# Patient Record
Sex: Male | Born: 1970 | Race: White | Hispanic: No | Marital: Married | Smoking: Never smoker
Health system: Southern US, Community
[De-identification: ages and names within clinical notes are randomized; demographics above are authoritative.]

## PROBLEM LIST (undated history)

## (undated) DIAGNOSIS — J209 Acute bronchitis, unspecified: Secondary | ICD-10-CM

## (undated) DIAGNOSIS — M109 Gout, unspecified: Secondary | ICD-10-CM

## (undated) DIAGNOSIS — J1282 Pneumonia due to coronavirus disease 2019: Secondary | ICD-10-CM

## (undated) DIAGNOSIS — I10 Essential (primary) hypertension: Secondary | ICD-10-CM

## (undated) DIAGNOSIS — J9621 Acute and chronic respiratory failure with hypoxia: Secondary | ICD-10-CM

## (undated) DIAGNOSIS — F419 Anxiety disorder, unspecified: Secondary | ICD-10-CM

## (undated) DIAGNOSIS — U071 COVID-19: Secondary | ICD-10-CM

## (undated) DIAGNOSIS — Z930 Tracheostomy status: Secondary | ICD-10-CM

## (undated) DIAGNOSIS — I2510 Atherosclerotic heart disease of native coronary artery without angina pectoris: Secondary | ICD-10-CM

## (undated) DIAGNOSIS — E785 Hyperlipidemia, unspecified: Secondary | ICD-10-CM

## (undated) NOTE — *Deleted (*Deleted)
Physician Discharge Summary       Patient ID: Lawrence Freeman MRN: [id number] DOB/AGE: Nov 23, 1971 69 y.o.  Admit date: 10/25/2020 Discharge date: 10/27/2020  Discharge Diagnoses:  Active Problems:   Pneumothorax on right   Pressure injury of skin   Chest tube in place   Malnutrition of moderate degree   History of Present Illness:   Hospital Course:     Discharge Plan by active problems      Significant Hospital tests/ studies  Consults   Discharge Exam: BP 123/77   Pulse 71   Temp 97.7 F (36.5 C) (Oral)   Resp (!) 23   Ht 5\' 10"  (1.778 m)   Wt 74.3 kg   SpO2 98%   BMI 23.50 kg/m   ******  Labs at discharge Lab Results  Component Value Date   CREATININE 0.72 10/27/2020   BUN 23 (H) 10/27/2020   NA 140 10/27/2020   K 4.0 10/27/2020   CL 95 (L) 10/27/2020   CO2 32 10/27/2020   Lab Results  Component Value Date   WBC 14.7 (H) 10/27/2020   HGB 7.7 (L) 10/27/2020   HCT 24.6 (L) 10/27/2020   MCV 99.6 10/27/2020   PLT 195 10/27/2020   Lab Results  Component Value Date   ALT 32 10/23/2020   AST 34 10/23/2020   ALKPHOS 65 10/23/2020   BILITOT 0.8 10/23/2020   Lab Results  Component Value Date   INR 1.2 10/19/2020   INR 1.1 10/17/2020    Current radiology studies DG Chest 1 View  Result Date: 10/26/2020 CLINICAL DATA:  Hypoxemia EXAM: CHEST  1 VIEW COMPARISON:  10/25/2020 FINDINGS: Tracheostomy tube and right chest tube remain in place, unchanged. Extensive subcutaneous emphysema throughout the chest wall, unchanged. Pneumomediastinum again noted. No visible pneumothorax. Heart is normal size. Patchy multifocal areas of consolidation slightly improved. No effusions. IMPRESSION: No visible pneumothorax. Pneumomediastinum and subcutaneous emphysema are stable. Patchy bilateral airspace opacities, slightly improved. Electronically Signed   By: Charlett Nose M.D.   On: 10/26/2020 01:26   DG CHEST PORT 1 VIEW  Result Date: 10/27/2020  CLINICAL DATA:  Right pneumothorax, COVID-19 EXAM: PORTABLE CHEST 1 VIEW COMPARISON:  Radiograph 10/26/2020, CT 10/23/2020 FINDINGS: Tracheostomy tube with the tip positioned in the lower trachea, 1.6 cm from the carina. Right upper extremity PICC tip terminates near the superior cavoatrial junction. Telemetry leads and additional external support devices overlie the chest. Right apically directed chest tube remains in place, appears slightly advanced from comparison though may be related to differences in positioning. No visible residual right pneumothorax is seen. No left pneumothorax. Pneumomediastinum is less readily apparent. Persistent heterogeneous airspace opacities are seen bilaterally. Diminishing subcutaneous emphysema across the base of the neck and chest wall. No acute osseous abnormalities. IMPRESSION: 1. Right chest tube appears slightly advanced from comparison though may be related to differences in positioning. No residual pneumothorax is visible within the limitations of a non upright radiograph. 2. Persistent bilateral heterogeneous airspace opacities. 3. Diminishing subcutaneous emphysema across the base of the neck and chest wall. 4. Previously seen pneumomediastinum is less readily apparent. Electronically Signed   By: Kreg Shropshire M.D.   On: 10/27/2020 02:31   DG CHEST PORT 1 VIEW  Result Date: 10/25/2020 CLINICAL DATA:  Right pneumothorax status post chest tube placement EXAM: PORTABLE CHEST 1 VIEW COMPARISON:  10/25/2020 at 6:13 p.m. FINDINGS: Single frontal view of the chest demonstrates interval placement of a right chest tube, with near complete resolution of  the right pneumothorax seen previously. Tracheostomy tube is unchanged. Right-sided PICC is stable. Cardiac silhouette is unremarkable. Extensive pneumomediastinum and subcutaneous emphysema limits evaluation of the underlying lung parenchyma. Patchy multifocal bilateral consolidation unchanged. No pleural effusion. IMPRESSION:  1. Near complete resolution of right pneumothorax after chest tube placement. 2. Persistent pneumomediastinum and subcutaneous gas. 3. Stable multifocal bilateral lung consolidation consistent with scarring or pneumonia. Electronically Signed   By: Sharlet Salina M.D.   On: 10/25/2020 22:47   DG CHEST PORT 1 VIEW  Result Date: 10/25/2020 CLINICAL DATA:  Rapid response intubation. EXAM: PORTABLE CHEST 1 VIEW COMPARISON:  Radiograph and chest CT 10/23/2020 FINDINGS: There is a new tracheostomy tube tip with tip projecting approximately 4.3 cm from the carina. Moderate-sized right pneumothorax of approximately 40%, new from prior exam. Large amount of subcutaneous emphysema involving the chest walls, also new. No obvious left pneumothorax. Pneumomediastinum on prior chest CT is not well seen on the current exam. Right upper extremity PICC remains in place. There heterogeneous bilateral lung opacities not significantly changed. Stable heart size and mediastinal contours. IMPRESSION: 1. New tracheostomy tube with tip projecting approximately 4.3 cm from the carina. 2. Moderate right pneumothorax of approximately 40%, new from prior exam. Large amount of subcutaneous emphysema involving both chest walls, also new. 3. Unchanged bilateral heterogeneous lung opacities. Critical Value/emergent results were called by telephone at the time of interpretation on 10/25/2020 at 6:28 pm to Dr Luna Kitchens , who verbally acknowledged these results. Electronically Signed   By: Narda Rutherford M.D.   On: 10/25/2020 18:28    Disposition:     Allergies as of 10/27/2020      Reactions   Penicillins Anaphylaxis   Precedex [dexmedetomidine Hcl In Nacl] Other (See Comments)   Severe bradycardia    Med Rec must be completed prior to using this [facility]***        Discharged Condition: stable

---
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
## 2020-09-20 DIAGNOSIS — Z930 Tracheostomy status: Secondary | ICD-10-CM

## 2020-09-20 HISTORY — DX: Tracheostomy status: Z93.0

## 2020-10-11 ENCOUNTER — Other Ambulatory Visit (HOSPITAL_COMMUNITY): Payer: BC Managed Care – PPO

## 2020-10-11 ENCOUNTER — Inpatient Hospital Stay
Admit: 2020-10-11 | Discharge: 2020-10-25 | Disposition: A | Discharge status: Still In Hospital | Payer: BC Managed Care – PPO | Source: Other Acute Inpatient Hospital | Attending: Internal Medicine | Admitting: Internal Medicine

## 2020-10-11 DIAGNOSIS — J9621 Acute and chronic respiratory failure with hypoxia: Secondary | ICD-10-CM | POA: Diagnosis present

## 2020-10-11 DIAGNOSIS — U071 COVID-19: Secondary | ICD-10-CM | POA: Diagnosis present

## 2020-10-11 DIAGNOSIS — Z9289 Personal history of other medical treatment: Secondary | ICD-10-CM

## 2020-10-11 DIAGNOSIS — J9690 Respiratory failure, unspecified, unspecified whether with hypoxia or hypercapnia: Secondary | ICD-10-CM

## 2020-10-11 DIAGNOSIS — J209 Acute bronchitis, unspecified: Secondary | ICD-10-CM | POA: Diagnosis present

## 2020-10-11 DIAGNOSIS — R1110 Vomiting, unspecified: Secondary | ICD-10-CM

## 2020-10-11 DIAGNOSIS — Z930 Tracheostomy status: Secondary | ICD-10-CM

## 2020-10-11 DIAGNOSIS — R000 Tachycardia, unspecified: Secondary | ICD-10-CM

## 2020-10-11 DIAGNOSIS — R4702 Dysphasia: Secondary | ICD-10-CM

## 2020-10-11 DIAGNOSIS — J189 Pneumonia, unspecified organism: Secondary | ICD-10-CM

## 2020-10-11 DIAGNOSIS — R1310 Dysphagia, unspecified: Secondary | ICD-10-CM

## 2020-10-11 DIAGNOSIS — R0603 Acute respiratory distress: Secondary | ICD-10-CM

## 2020-10-11 DIAGNOSIS — J1282 Pneumonia due to coronavirus disease 2019: Secondary | ICD-10-CM | POA: Diagnosis present

## 2020-10-11 HISTORY — DX: Acute and chronic respiratory failure with hypoxia: J96.21

## 2020-10-11 HISTORY — DX: COVID-19: U07.1

## 2020-10-11 HISTORY — DX: Pneumonia due to coronavirus disease 2019: J12.82

## 2020-10-11 HISTORY — DX: Acute bronchitis, unspecified: J20.9

## 2020-10-11 LAB — BLOOD GAS, ARTERIAL
Acid-Base Excess: 6.6 mmol/L — ABNORMAL HIGH (ref 0.0–2.0)
Bicarbonate: 32.2 mmol/L — ABNORMAL HIGH (ref 20.0–28.0)
FIO2: 50
O2 Saturation: 97.2 %
Patient temperature: 37
pCO2 arterial: 62.3 mmHg — ABNORMAL HIGH (ref 32.0–48.0)
pH, Arterial: 7.334 — ABNORMAL LOW (ref 7.350–7.450)
pO2, Arterial: 98.3 mmHg (ref 83.0–108.0)

## 2020-10-12 LAB — COMPREHENSIVE METABOLIC PANEL
ALT: 82 U/L — ABNORMAL HIGH (ref 0–44)
AST: 36 U/L (ref 15–41)
Albumin: 2.6 g/dL — ABNORMAL LOW (ref 3.5–5.0)
Alkaline Phosphatase: 87 U/L (ref 38–126)
Anion gap: 13 (ref 5–15)
BUN: 34 mg/dL — ABNORMAL HIGH (ref 6–20)
CO2: 28 mmol/L (ref 22–32)
Calcium: 9.1 mg/dL (ref 8.9–10.3)
Chloride: 101 mmol/L (ref 98–111)
Creatinine, Ser: 0.97 mg/dL (ref 0.61–1.24)
GFR, Estimated: >60 mL/min (ref >60)
Glucose, Bld: 204 mg/dL — ABNORMAL HIGH (ref 70–99)
Potassium: 4.3 mmol/L (ref 3.5–5.1)
Sodium: 142 mmol/L (ref 135–145)
Total Bilirubin: 0.4 mg/dL (ref 0.3–1.2)
Total Protein: 6.4 g/dL — ABNORMAL LOW (ref 6.5–8.1)

## 2020-10-12 LAB — CBC WITH DIFFERENTIAL/PLATELET
Abs Immature Granulocytes: 0 10*3/uL (ref 0.00–0.07)
Basophils Absolute: 0.3 10*3/uL — ABNORMAL HIGH (ref 0.0–0.1)
Basophils Relative: 2 %
Eosinophils Absolute: 1.3 10*3/uL — ABNORMAL HIGH (ref 0.0–0.5)
Eosinophils Relative: 8 %
HCT: 29.4 % — ABNORMAL LOW (ref 39.0–52.0)
Hemoglobin: 9.4 g/dL — ABNORMAL LOW (ref 13.0–17.0)
Lymphocytes Relative: 15 %
Lymphs Abs: 2.4 10*3/uL (ref 0.7–4.0)
MCH: 29.6 pg (ref 26.0–34.0)
MCHC: 32 g/dL (ref 30.0–36.0)
MCV: 92.5 fL (ref 80.0–100.0)
Monocytes Absolute: 0.2 10*3/uL (ref 0.1–1.0)
Monocytes Relative: 1 %
Neutro Abs: 11.8 10*3/uL — ABNORMAL HIGH (ref 1.7–7.7)
Neutrophils Relative %: 74 %
Platelets: 345 10*3/uL (ref 150–400)
RBC: 3.18 MIL/uL — ABNORMAL LOW (ref 4.22–5.81)
RDW: 15.4 % (ref 11.5–15.5)
WBC: 16 10*3/uL — ABNORMAL HIGH (ref 4.0–10.5)
nRBC: 0.4 % — ABNORMAL HIGH (ref 0.0–0.2)
nRBC: 1 /100 WBC — ABNORMAL HIGH

## 2020-10-13 ENCOUNTER — Other Ambulatory Visit (HOSPITAL_COMMUNITY): Payer: BC Managed Care – PPO

## 2020-10-13 ENCOUNTER — Encounter: Payer: Self-pay | Admitting: Internal Medicine

## 2020-10-13 DIAGNOSIS — Z930 Tracheostomy status: Secondary | ICD-10-CM | POA: Diagnosis not present

## 2020-10-13 DIAGNOSIS — J1282 Pneumonia due to coronavirus disease 2019: Secondary | ICD-10-CM | POA: Diagnosis present

## 2020-10-13 DIAGNOSIS — U071 COVID-19: Secondary | ICD-10-CM | POA: Diagnosis present

## 2020-10-13 DIAGNOSIS — J9621 Acute and chronic respiratory failure with hypoxia: Secondary | ICD-10-CM | POA: Diagnosis present

## 2020-10-13 DIAGNOSIS — J209 Acute bronchitis, unspecified: Secondary | ICD-10-CM | POA: Diagnosis not present

## 2020-10-13 LAB — CBC
HCT: 26.7 % — ABNORMAL LOW (ref 39.0–52.0)
Hemoglobin: 8.4 g/dL — ABNORMAL LOW (ref 13.0–17.0)
MCH: 29.7 pg (ref 26.0–34.0)
MCHC: 31.5 g/dL (ref 30.0–36.0)
MCV: 94.3 fL (ref 80.0–100.0)
Platelets: 449 10*3/uL — ABNORMAL HIGH (ref 150–400)
RBC: 2.83 MIL/uL — ABNORMAL LOW (ref 4.22–5.81)
RDW: 15.6 % — ABNORMAL HIGH (ref 11.5–15.5)
WBC: 27 10*3/uL — ABNORMAL HIGH (ref 4.0–10.5)
nRBC: 0.3 % — ABNORMAL HIGH (ref 0.0–0.2)

## 2020-10-13 LAB — BLOOD GAS, ARTERIAL
Acid-Base Excess: 3.4 mmol/L — ABNORMAL HIGH (ref 0.0–2.0)
Acid-Base Excess: 6.4 mmol/L — ABNORMAL HIGH (ref 0.0–2.0)
Bicarbonate: 31.8 mmol/L — ABNORMAL HIGH (ref 20.0–28.0)
Bicarbonate: 33.2 mmol/L — ABNORMAL HIGH (ref 20.0–28.0)
FIO2: 35
FIO2: 45
O2 Saturation: 94.2 %
O2 Saturation: 99.4 %
Patient temperature: 35.9
Patient temperature: 35.9
pCO2 arterial: 91.8 mmHg (ref 32.0–48.0)
pCO2 arterial: 72.5 mmHg (ref 32.0–48.0)
pH, Arterial: 7.157 — CL (ref 7.350–7.450)
pH, Arterial: 7.275 — ABNORMAL LOW (ref 7.350–7.450)
pO2, Arterial: 78.2 mmHg — ABNORMAL LOW (ref 83.0–108.0)
pO2, Arterial: 148 mmHg — ABNORMAL HIGH (ref 83.0–108.0)

## 2020-10-13 LAB — COMPREHENSIVE METABOLIC PANEL
ALT: 64 U/L — ABNORMAL HIGH (ref 0–44)
AST: 24 U/L (ref 15–41)
Albumin: 2.7 g/dL — ABNORMAL LOW (ref 3.5–5.0)
Alkaline Phosphatase: 103 U/L (ref 38–126)
Anion gap: 13 (ref 5–15)
BUN: 39 mg/dL — ABNORMAL HIGH (ref 6–20)
CO2: 27 mmol/L (ref 22–32)
Calcium: 9.2 mg/dL (ref 8.9–10.3)
Chloride: 103 mmol/L (ref 98–111)
Creatinine, Ser: 0.96 mg/dL (ref 0.61–1.24)
GFR, Estimated: >60 mL/min (ref >60)
Glucose, Bld: 239 mg/dL — ABNORMAL HIGH (ref 70–99)
Potassium: 5.1 mmol/L (ref 3.5–5.1)
Sodium: 143 mmol/L (ref 135–145)
Total Bilirubin: 0.4 mg/dL (ref 0.3–1.2)
Total Protein: 6.7 g/dL (ref 6.5–8.1)

## 2020-10-13 LAB — TROPONIN I (HIGH SENSITIVITY): Troponin I (High Sensitivity): 14 ng/L (ref <18)

## 2020-10-13 LAB — MAGNESIUM: Magnesium: 2.2 mg/dL (ref 1.7–2.4)

## 2020-10-13 LAB — HEMOGLOBIN A1C
Hgb A1c MFr Bld: 6.5 % — ABNORMAL HIGH (ref 4.8–5.6)
Mean Plasma Glucose: 140 mg/dL

## 2020-10-13 NOTE — Consult Note (Addendum)
Infectious Disease Consultation   Lawrence Freeman  NKN:[id number]  DOB: 07/19/1971  DOA: 10/11/2020  Requesting physician: Dr.Hijazi  Reason for consultation: Antibiotic recommendations   History of Present Illness: Lawrence Freeman is an 49 y.o. male with medical history significant of coronary disease, hypertension, hyperlipidemia, gout who was admitted to the acute facility after transfer from [facility] with COVID-19 pneumonia and pneumomediastinum.  Apparently symptoms started on 08/09/2020, he tested positive for Covid on 08/15/2020.  He was treated with remdesivir/dexamethasone and placed on high flow nasal cannula with subsequent pneumomediastinum.  On 08/31/2019 when he was transferred to [facility] with initial improvement, weaned from BiPAP to high HFNC and then transferred out of the ICU.  However, 09/05/2020 patient decompensated and was intubated.  Janina Mayo was done on 09/20/2020.  His hospital stay was complicated by ARDS, multiple infections.  He had tracheal aspirate cultures that showed Klebsiella pneumonia and he was started on Azactam by infectious disease.  He had NG tube placed.  Patient apparently this morning was having agonal breathing and rapid response was called.  ABG showed acidosis.  He is currently on 45% FiO2, 7 of PEEP.  He was on treatment with Azactam.  However, now having worsening WBC count.   Review of Systems:  Unable to obtain at this time  Past Medical History: Past Medical History:  Diagnosis Date  . Acute infective tracheobronchitis   . Acute on chronic respiratory failure with hypoxia (HCC)   . COVID-19 virus infection   . Pneumonia due to COVID-19 virus   . Tracheostomy status (HCC)   Coronary disease, hypertension, hyperlipidemia  Past Surgical History: Coronary stent (06/01/2019), tracheostomy  Allergies: Penicillin  Social History: Per records no history of alcohol abuse or substance abuse.  No history of  smoking but smokeless tobacco use?   Family History: Unable to obtain at this time   Physical Exam: Vitals: Temperature 99.7, pulse 103, respiratory rate 30, blood pressure 148/89, pulse oximetry 99% on 45% FiO2, 7 of PEEP Constitutional: Ill-appearing male, has trach, on ventilator, unresponsive Head: Atraumatic, normocephalic Eyes: Pupils equal and reactive ENMT: external ears and nose appear normal, Lips appears normal, moist oral mucosa Neck: Has trach in place CVS: S1-S2 Respiratory: Coarse breath sounds bilateral Abdomen: soft, positive bowel sounds Musculoskeletal: No edema Neuro: Unable to assess at this time Psych: Unable to assess at this time Skin: no rashes   Data reviewed:  I have personally reviewed following labs and imaging studies Labs:  CBC: Recent Labs  Lab 10/12/20 0554 10/13/20 0928  WBC 16.0* 27.0*  NEUTROABS 11.8*  --   HGB 9.4* 8.4*  HCT 29.4* 26.7*  MCV 92.5 94.3  PLT 345 449*    Basic Metabolic Panel: Recent Labs  Lab 10/12/20 0554 10/13/20 0928  NA 142 143  K 4.3 5.1  CL 101 103  CO2 28 27  GLUCOSE 204* 239*  BUN 34* 39*  CREATININE 0.97 0.96  CALCIUM 9.1 9.2  MG  --  2.2   GFR CrCl cannot be calculated (Unknown ideal weight.). Liver Function Tests: Recent Labs  Lab 10/12/20 0554 10/13/20 0928  AST 36 24  ALT 82* 64*  ALKPHOS 87 103  BILITOT 0.4 0.4  PROT 6.4* 6.7  ALBUMIN 2.6* 2.7*   No results for input(s): LIPASE, AMYLASE in the last 168 hours. No results for input(s): AMMONIA in the last 168 hours. Coagulation profile No results for input(s): INR, PROTIME in the last 168 hours.  Cardiac  Enzymes: No results for input(s): CKTOTAL, CKMB, CKMBINDEX, TROPONINI in the last 168 hours. BNP: Invalid input(s): POCBNP CBG: No results for input(s): GLUCAP in the last 168 hours. D-Dimer No results for input(s): DDIMER in the last 72 hours. Hgb A1c Recent Labs    10/12/20 0554  HGBA1C 6.5*   Lipid Profile No  results for input(s): CHOL, HDL, LDLCALC, TRIG, CHOLHDL, LDLDIRECT in the last 72 hours. Thyroid function studies No results for input(s): TSH, T4TOTAL, T3FREE, THYROIDAB in the last 72 hours.  Invalid input(s): FREET3 Anemia work up No results for input(s): VITAMINB12, FOLATE, FERRITIN, TIBC, IRON, RETICCTPCT in the last 72 hours. Urinalysis No results found for: COLORURINE, APPEARANCEUR, LABSPEC, PHURINE, GLUCOSEU, HGBUR, BILIRUBINUR, KETONESUR, PROTEINUR, UROBILINOGEN, NITRITE, LEUKOCYTESUR   Microbiology No results found for this or any previous visit (from the past 240 hour(s)).   Inpatient Medications:   Please see MAR  Radiological Exams on Admission: DG Chest Port 1 View  Result Date: 10/13/2020 CLINICAL DATA:  Respiratory failure. EXAM: PORTABLE CHEST 1 VIEW COMPARISON:  10/11/2020 FINDINGS: Gastric tube courses below the diaphragm with the tip outside the field of view. Right PICC in similar position with tip projecting at the SVC. Tracheostomy tube in similar position, with tip projecting midline over the trachea. Similar appearance of bilateral peripheral predominant interstitial and airspace opacities. No visible pleural effusions or pneumothorax. Cardiomediastinal silhouette is within normal limits. No acute osseous abnormality. IMPRESSION: Similar bilateral opacities, compatible with multifocal pneumonia. Electronically Signed   By: Feliberto Harts MD   On: 10/13/2020 08:21   DG CHEST PORT 1 VIEW  Result Date: 10/11/2020 CLINICAL DATA:  Pneumonia EXAM: PORTABLE CHEST 1 VIEW COMPARISON:  Portable exam 1751 hours without priors for comparison FINDINGS: Tracheostomy tube projects over tracheal air column. Nasogastric tube extends into stomach with tip projecting over distal antrum/pyloric region. RIGHT arm PICC line with tip projecting over SVC. Normal heart size mediastinal contours. Diffuse BILATERAL pulmonary infiltrates consistent with multifocal pneumonia. No pleural  effusion or pneumothorax. IMPRESSION: BILATERAL pulmonary infiltrates consistent with multifocal pneumonia. Electronically Signed   By: Ulyses Southward M.D.   On: 10/11/2020 18:09    Impression/Recommendations Active Problems:   Acute on chronic respiratory failure with hypoxia, ventilator dependent   COVID-19 virus infection Pneumonia with Klebsiella  Acute infective tracheobronchitis Fever/leukocytosis Dysphagia/protein calorie malnutrition Coronary disease status post stent placement  Acute on chronic respiratory failure with hypoxemia: Patient currently ventilator dependent.  He had ABG this morning that showed acidosis.  He is currently on 45% FiO2, PEEP of 7.  Pulmonary following.  Multifactorial etiology.  Initially started with COVID-19 infection and subsequently had secondary bacterial pneumonia with respiratory cultures at the outside facility that are growing Klebsiella apparently sensitive to cephalosporins. He started having fever, worsening leukocyte count while on Azactam.  Also concern for acute tracheobronchitis. Therefore antibiotic changed to cefepime. Reportedly has allergy to penicillin but not sure if he was challenged with cephalosporins on Carbapenem. Flagyl added for anaerobic coverage.  Per records from the outside facility he already received treatment with ciprofloxacin, Azactam but now worsening. Given his worsening condition switched to cefepime. Continue to monitor very closely.  If he starts having any rash or hypotension recommend to discontinue the antibiotic.  Obtain records from the outside facility.  Repeat cultures from here are ordered.  Follow-up on repeat cultures and adjust antibiotics accordingly. -He apparently also had MRSA pneumonia at the outside facility and already received treatment with IV vancomycin.  COVID-19 infection with pneumonia: He  received remdesivir, dexamethasone.  Patient apparently was not vaccinated.  Here he has been started on hydroxyurea  protocol.  Also suspicion for ARDS.  Continue supportive management.  Fever/leukocytosis: Likely secondary to the pneumonia.  As mentioned above respiratory cultures from the outside facility growing Klebsiella.  He is worsening while on the Azactam.  Therefore broadened to cefepime.  Apparently the Klebsiella was susceptible to the cephalosporins.  Reported allergy to penicillin but not sure if he was challenged with cephalosporins or Carbapenem.  We had no choice but to broaden given his worsening. Continue to monitor closely.  Obtain records from the outside facility.  Follow-up on the cultures from here and adjust antibiotics accordingly. -We will also order blood cultures. -If he continues to have fevers suggest to add vancomycin for gram-positive coverage.  Dysphagia/protein calorie malnutrition: Currently has an NG tube in place.  Further management per the primary team.  History of coronary artery disease, hypertension, hyperlipidemia: Junious Dresser medication and management per the primary team.  Unfortunately due to his complex medical problems he is high risk for worsening and decompensation.  Thank you for this consultation.   Plan of care discussed with pharmacy, primary team.  ADDENDUM: As soon as patient was started on cefepime he started having flushing of his face and mild erythema of his face.  Therefore discontinued cefepime.  Start ciprofloxacin.  Patient seen by myself and Dr. Sharyon Medicus at the bedside. Being given a dose of Benadryl.  He may need steroids if not improving.  At this time do not think he could be even challenged with Carbapenem's.   Vonzella Nipple M.D. 10/13/2020, 2:32 PM

## 2020-10-13 NOTE — Consult Note (Signed)
Pulmonary Critical Care Medicine [facility] [place]  PULMONARY SERVICE  Date of Service: 10/13/2020  PULMONARY CRITICAL CARE CONSULT   [name]  XYI:[id number]  DOB: 1971-04-27   DOA: 10/11/2020  Referring Physician: Carron Curie, MD  HPI: Lawrence Freeman is a 49 y.o. male seen for follow up of Acute on Chronic Respiratory Failure.  Patient has multiple medical problems including coronary artery disease hypertension hyperlipidemia gout who came into the hospital after having been diagnosed with COVID-19.  Patient apparently suffered a pneumomediastinum also at the time.  Patient was treated with remdesivir and Decadron.  Was initially placed on high flow oxygen but then decompensated and of having to be intubated after failing BiPAP trial.  Subsequently was not able to come off of the ventilator and had a tracheostomy done on September 28.  Patient still continues to have significant decline in his status remains on the ventilator high ventilatory demand.  This morning patient was having agonal respirations rapid response was called and patient ABG was as noted below with significant acidosis.  We asked for increased sedation to be done and I will try to take control of his ventilation and follow-up with ABGs.  Review of Systems:  ROS performed and is unremarkable other than noted above.  Past medical history: Coronary artery disease Hypertension Hyperlipidemia Gout COVID-19 virus infection Klebsiella tracheobronchitis  Past surgical history: Tracheostomy  Social history: Unknown tobacco alcohol or drug abuse  Family history: Noncontributory to present illness  Medications: Reviewed on Rounds  Physical Exam:  Vitals: Temperature is 95.8 pulse 65 respiratory 31 blood pressure is 133/76 saturations 95%  Ventilator Settings on assist control respiratory rate 35 increased FiO2 of 50% tidal volume 430 PEEP 7  . General: Comfortable at this  time . Eyes: Grossly normal lids, irises & conjunctiva . ENT: grossly tongue is normal . Neck: no obvious mass . Cardiovascular: S1-S2 normal no gallop or rub . Respiratory: Coarse rhonchi noted bilaterally . Abdomen: Soft and nontender . Skin: no rash seen on limited exam . Musculoskeletal: not rigid . Psychiatric:unable to assess . Neurologic: no seizure no involuntary movements         Labs on Admission:  Basic Metabolic Panel: Recent Labs  Lab 10/12/20 0554  NA 142  K 4.3  CL 101  CO2 28  GLUCOSE 204*  BUN 34*  CREATININE 0.97  CALCIUM 9.1    Recent Labs  Lab 10/11/20 1805 10/13/20 0735  PHART 7.334* 7.157*  PCO2ART 62.3* 91.8*  PO2ART 98.3 78.2*  HCO3 32.2* 31.8*  O2SAT 97.2 94.2    Liver Function Tests: Recent Labs  Lab 10/12/20 0554  AST 36  ALT 82*  ALKPHOS 87  BILITOT 0.4  PROT 6.4*  ALBUMIN 2.6*   No results for input(s): LIPASE, AMYLASE in the last 168 hours. No results for input(s): AMMONIA in the last 168 hours.  CBC: Recent Labs  Lab 10/12/20 0554  WBC 16.0*  NEUTROABS 11.8*  HGB 9.4*  HCT 29.4*  MCV 92.5  PLT 345    Cardiac Enzymes: No results for input(s): CKTOTAL, CKMB, CKMBINDEX, TROPONINI in the last 168 hours.  BNP (last 3 results) No results for input(s): BNP in the last 8760 hours.  ProBNP (last 3 results) No results for input(s): PROBNP in the last 8760 hours.   Radiological Exams on Admission: DG Chest Port 1 View  Result Date: 10/13/2020 CLINICAL DATA:  Respiratory failure. EXAM: PORTABLE CHEST 1 VIEW COMPARISON:  10/11/2020 FINDINGS: Gastric tube  courses below the diaphragm with the tip outside the field of view. Right PICC in similar position with tip projecting at the SVC. Tracheostomy tube in similar position, with tip projecting midline over the trachea. Similar appearance of bilateral peripheral predominant interstitial and airspace opacities. No visible pleural effusions or pneumothorax. Cardiomediastinal  silhouette is within normal limits. No acute osseous abnormality. IMPRESSION: Similar bilateral opacities, compatible with multifocal pneumonia. Electronically Signed   By: Feliberto Harts MD   On: 10/13/2020 08:21   DG CHEST PORT 1 VIEW  Result Date: 10/11/2020 CLINICAL DATA:  Pneumonia EXAM: PORTABLE CHEST 1 VIEW COMPARISON:  Portable exam 1751 hours without priors for comparison FINDINGS: Tracheostomy tube projects over tracheal air column. Nasogastric tube extends into stomach with tip projecting over distal antrum/pyloric region. RIGHT arm PICC line with tip projecting over SVC. Normal heart size mediastinal contours. Diffuse BILATERAL pulmonary infiltrates consistent with multifocal pneumonia. No pleural effusion or pneumothorax. IMPRESSION: BILATERAL pulmonary infiltrates consistent with multifocal pneumonia. Electronically Signed   By: Ulyses Southward M.D.   On: 10/11/2020 18:09    Assessment/Plan Active Problems:   Acute on chronic respiratory failure with hypoxia (HCC)   COVID-19 virus infection   Acute infective tracheobronchitis   Pneumonia due to COVID-19 virus   Tracheostomy status (HCC)   1. Acute on chronic respiratory failure with hypoxia patient is ventilatory demands are still quite high is in dyssynchrony with the ventilator.  Recommended sedation wrote an order for Versed and fentanyl drip to be started.  We will follow up with ABGs.  Chest x-ray was also done today which shows persistence of bilateral infiltrates. 2. COVID-19 virus infection in recovery patient has received remdesivir Decadron. 3. Klebsiella tracheobronchitis patient had this presentation was started on Azactam 4. Tracheostomy status remains in place we will continue with supportive care. 5. COVID-19 pneumonia has been treated patient has residual pulmonary deficits noted on the chest films.  Overall prognosis quite guarded  I have personally seen and evaluated the patient, evaluated laboratory and imaging  results, formulated the assessment and plan and placed orders. The Patient requires high complexity decision making with multiple systems involvement.  Case was discussed on Rounds with the Respiratory Therapy Director and the Respiratory staff Time Spent  Yevonne Pax, MD [facility] Pulmonary Critical Care Medicine Sleep Medicine

## 2020-10-13 NOTE — Consult Note (Signed)
Referring Physician: Dr. Sharyon Medicus.  Lawrence Freeman is an 49 y.o. male.                       Chief Complaint: sinus tachycardia/Bradycardia  HPI: 49 years old male with PMH of COVID-19 pneumonia, secondary bacterial pneumonia, sepsis, s/p tracheostomy has episodes of sinus bradycardia and tachycardia. Prior to bradycardia he had agonal respirations, respiratory and metabolic acidosis requiring increased sedation and ventilator support. Currently he is mildly tachycardic.  Past Medical History:  Diagnosis Date  . Acute infective tracheobronchitis   . Acute on chronic respiratory failure with hypoxia (HCC)   . COVID-19 virus infection   . Pneumonia due to COVID-19 virus   . Tracheostomy status (HCC)       The histories are not reviewed yet. Please review them in the "History" navigator section and refresh this SmartLink.  No family history on file. Social History:  has no history on file for tobacco use, alcohol use, and drug use.  Allergies: Not on File  No medications prior to admission.  Aztreonam 2 gm tid Allopurinol 100 mg. Tid Clonazepam 1 mg. Bid Aspirin 81 mg. Daily Atorvastatin 40 mg. Daily Famotidine 20 mg. Bid. Gabapentin 100 mg. 2 TID Hydroxyurea 500 mg, bid Lantus 10 units SQ daily Lisinopril 5 mg. One daily. Metoprolol 25 mg bid. Prasugrel 10 mg. One daily.   Results for orders placed or performed during the hospital encounter of 10/11/20 (from the past 48 hour(s))  CBC with Differential/Platelet     Status: Abnormal   Collection Time: 10/12/20  5:54 AM  Result Value Ref Range   WBC 16.0 (H) 4.0 - 10.5 K/uL   RBC 3.18 (L) 4.22 - 5.81 MIL/uL   Hemoglobin 9.4 (L) 13.0 - 17.0 g/dL   HCT 46.5 (L) 39 - 52 %   MCV 92.5 80.0 - 100.0 fL   MCH 29.6 26.0 - 34.0 pg   MCHC 32.0 30.0 - 36.0 g/dL   RDW 68.1 27.5 - 17.0 %   Platelets 345 150 - 400 K/uL   nRBC 0.4 (H) 0.0 - 0.2 %   Neutrophils Relative % 74 %   Neutro Abs 11.8 (H) 1.7 - 7.7 K/uL   Lymphocytes  Relative 15 %   Lymphs Abs 2.4 0.7 - 4.0 K/uL   Monocytes Relative 1 %   Monocytes Absolute 0.2 0.1 - 1.0 K/uL   Eosinophils Relative 8 %   Eosinophils Absolute 1.3 (H) 0.0 - 0.5 K/uL   Basophils Relative 2 %   Basophils Absolute 0.3 (H) 0.0 - 0.1 K/uL   nRBC 1 (H) 0 /100 WBC   Abs Immature Granulocytes 0.00 0.00 - 0.07 K/uL   Pappenheimer Bodies PRESENT    Polychromasia PRESENT    Stomatocytes PRESENT     Comment: Performed at [facility], [address] [place], [place] [address]  Hemoglobin A1c     Status: Abnormal   Collection Time: 10/12/20  5:54 AM  Result Value Ref Range   Hgb A1c MFr Bld 6.5 (H) 4.8 - 5.6 %    Comment: (NOTE)         Prediabetes: 5.7 - 6.4         Diabetes: >6.4         Glycemic control for adults with diabetes: <7.0    Mean Plasma Glucose 140 mg/dL    Comment: (NOTE) Performed At: [facility] [address] [place], [place] [id number] Jolene Schimke MD [id number]  Comprehensive metabolic panel     Status: Abnormal   Collection Time: 10/12/20  5:54 AM  Result Value Ref Range   Sodium 142 135 - 145 mmol/L   Potassium 4.3 3.5 - 5.1 mmol/L   Chloride 101 98 - 111 mmol/L   CO2 28 22 - 32 mmol/L   Glucose, Bld 204 (H) 70 - 99 mg/dL    Comment: Glucose reference range applies only to samples taken after fasting for at least 8 hours.   BUN 34 (H) 6 - 20 mg/dL   Creatinine, Ser 2.20 0.61 - 1.24 mg/dL   Calcium 9.1 8.9 - 25.4 mg/dL   Total Protein 6.4 (L) 6.5 - 8.1 g/dL   Albumin 2.6 (L) 3.5 - 5.0 g/dL   AST 36 15 - 41 U/L   ALT 82 (H) 0 - 44 U/L   Alkaline Phosphatase 87 38 - 126 U/L   Total Bilirubin 0.4 0.3 - 1.2 mg/dL   GFR, Estimated >27 >06 mL/min   Anion gap 13 5 - 15    Comment: Performed at [facility], [address] [place], [place] [address]  Blood gas, arterial     Status: Abnormal   Collection Time: 10/13/20  7:35 AM  Result Value Ref Range   FIO2 35.00    pH, Arterial 7.157 (LL) 7.35 - 7.45    Comment:  CRITICAL RESULT CALLED TO, READ BACK BY AND VERIFIED WITH: J.FURIGAY,RN 0753 10/13/20 CLARK,S    pCO2 arterial 91.8 (HH) 32 - 48 mmHg    Comment: CRITICAL RESULT CALLED TO, READ BACK BY AND VERIFIED WITH: J.FURIGAY,RN [id number] 10/13/20 CLARK,S    pO2, Arterial 78.2 (L) 83 - 108 mmHg   Bicarbonate 31.8 (H) 20.0 - 28.0 mmol/L   Acid-Base Excess 3.4 (H) 0.0 - 2.0 mmol/L   O2 Saturation 94.2 %   Patient temperature 35.9    Collection site RIGHT RADIAL    Drawn by COLLECTED BY RT     Comment: M.MANUEL   Sample type ARTERIAL DRAW    Allens test (pass/fail) PASS PASS    Comment: Performed at [facility], [address] [place], [place] [address]  Comprehensive metabolic panel     Status: Abnormal   Collection Time: 10/13/20  9:28 AM  Result Value Ref Range   Sodium 143 135 - 145 mmol/L   Potassium 5.1 3.5 - 5.1 mmol/L   Chloride 103 98 - 111 mmol/L   CO2 27 22 - 32 mmol/L   Glucose, Bld 239 (H) 70 - 99 mg/dL    Comment: Glucose reference range applies only to samples taken after fasting for at least 8 hours.   BUN 39 (H) 6 - 20 mg/dL   Creatinine, Ser 0.73 0.61 - 1.24 mg/dL   Calcium 9.2 8.9 - 71.0 mg/dL   Total Protein 6.7 6.5 - 8.1 g/dL   Albumin 2.7 (L) 3.5 - 5.0 g/dL   AST 24 15 - 41 U/L   ALT 64 (H) 0 - 44 U/L   Alkaline Phosphatase 103 38 - 126 U/L   Total Bilirubin 0.4 0.3 - 1.2 mg/dL   GFR, Estimated >62 >69 mL/min    Comment: (NOTE) Calculated using the CKD-EPI Creatinine Equation (2021)    Anion gap 13 5 - 15    Comment: Performed at [facility], [address] [place], [place] [address]  Magnesium     Status: None   Collection Time: 10/13/20  9:28 AM  Result Value  Ref Range   Magnesium 2.2 1.7 - 2.4 mg/dL    Comment: Performed at [facility], [address] [place], [place] [address]  Troponin I (High Sensitivity)     Status: None   Collection Time: 10/13/20  9:28 AM  Result Value Ref Range   Troponin I (High Sensitivity) 14 <18 ng/L     Comment: (NOTE) Elevated high sensitivity troponin I (hsTnI) values and significant  changes across serial measurements may suggest ACS but many other  chronic and acute conditions are known to elevate hsTnI results.  Refer to the "Links" section for chest pain algorithms and additional  guidance. Performed at [facility], [address] [place], [place] [address]   CBC     Status: Abnormal   Collection Time: 10/13/20  9:28 AM  Result Value Ref Range   WBC 27.0 (H) 4.0 - 10.5 K/uL   RBC 2.83 (L) 4.22 - 5.81 MIL/uL   Hemoglobin 8.4 (L) 13.0 - 17.0 g/dL   HCT 09.826.7 (L) 39 - 52 %   MCV 94.3 80.0 - 100.0 fL   MCH 29.7 26.0 - 34.0 pg   MCHC 31.5 30.0 - 36.0 g/dL   RDW 11.915.6 (H) 14.711.5 - 82.915.5 %   Platelets 449 (H) 150 - 400 K/uL   nRBC 0.3 (H) 0.0 - 0.2 %    Comment: Performed at [facility], [address] [place], [place] [address]  Blood gas, arterial     Status: Abnormal   Collection Time: 10/13/20  1:15 PM  Result Value Ref Range   FIO2 45.00    pH, Arterial 7.275 (L) 7.35 - 7.45   pCO2 arterial 72.5 (HH) 32 - 48 mmHg    Comment: CRITICAL RESULT CALLED TO, READ BACK BY AND VERIFIED WITH: J.FURIGAY,RN 1332 10/13/20 CLARK,S    pO2, Arterial 148 (H) 83 - 108 mmHg   Bicarbonate 33.2 (H) 20.0 - 28.0 mmol/L   Acid-Base Excess 6.4 (H) 0.0 - 2.0 mmol/L   O2 Saturation 99.4 %   Patient temperature 35.9    Collection site RIGHT RADIAL    Drawn by COLLECTED BY RT     Comment: M.MANUEL,RT   Sample type ARTERIAL DRAW    Allens test (pass/fail) PASS PASS    Comment: Performed at [facility], [address] [place], [place] [address]   DG Chest Port 1 View  Result Date: 10/13/2020 CLINICAL DATA:  Respiratory failure. EXAM: PORTABLE CHEST 1 VIEW COMPARISON:  10/11/2020 FINDINGS: Gastric tube courses below the diaphragm with the tip outside the field of view. Right PICC in similar position with tip projecting at the SVC. Tracheostomy tube in similar position, with tip  projecting midline over the trachea. Similar appearance of bilateral peripheral predominant interstitial and airspace opacities. No visible pleural effusions or pneumothorax. Cardiomediastinal silhouette is within normal limits. No acute osseous abnormality. IMPRESSION: Similar bilateral opacities, compatible with multifocal pneumonia. Electronically Signed   By: Feliberto HartsFrederick S Jones MD   On: 10/13/2020 08:21    Review Of Systems As per HPI.  P: 110, R: 30, BP: 140/80's, O2 sat 99 % on 45 % FiO2 There were no vitals taken for this visit. There is no height or weight on file to calculate BMI. General appearance: Sedated, appears stated age and severe respiratory distress Head: Normocephalic, atraumatic. Eyes: Blue eyes, pink conjunctiva, corneas clear.  Neck: Trach in place. No JVD. Resp: Rhonchi to auscultation bilaterally. Cardio: Tachycardic, Regular rate and rhythm,  S1, S2 normal, II/VI systolic murmur, no click, rub or gallop GI: Soft, non-tender; bowel sounds normal; no organomegaly. Extremities: No edema, cyanosis or clubbing. Skin: Warm and dry.  Neurologic: Alert and oriented X 0.  Assessment/Plan Sinus tachycardia Bradycardia probably vagally induced. Acute on chronic respiratory failure with hypoxia, ventilator dependent COVID 19 virus infection Klebsiella pneumonia Sepsis CAD S/P stent placement  May use low dose B-blocker as needed for heart rate control.  Time spent: Review of old records, Lab, x-rays, EKG, other cardiac tests, examination, discussion with patient, step dad, nurse and PA over 70 minutes.  Ricki Rodriguez, MD  10/13/2020, 10:18 PM

## 2020-10-14 DIAGNOSIS — U071 COVID-19: Secondary | ICD-10-CM | POA: Diagnosis not present

## 2020-10-14 DIAGNOSIS — J1282 Pneumonia due to coronavirus disease 2019: Secondary | ICD-10-CM | POA: Diagnosis not present

## 2020-10-14 DIAGNOSIS — Z930 Tracheostomy status: Secondary | ICD-10-CM | POA: Diagnosis not present

## 2020-10-14 DIAGNOSIS — J9621 Acute and chronic respiratory failure with hypoxia: Secondary | ICD-10-CM | POA: Diagnosis not present

## 2020-10-14 LAB — BASIC METABOLIC PANEL
Anion gap: 9 (ref 5–15)
BUN: 38 mg/dL — ABNORMAL HIGH (ref 6–20)
CO2: 33 mmol/L — ABNORMAL HIGH (ref 22–32)
Calcium: 9.2 mg/dL (ref 8.9–10.3)
Chloride: 99 mmol/L (ref 98–111)
Creatinine, Ser: 0.81 mg/dL (ref 0.61–1.24)
GFR, Estimated: >60 mL/min (ref >60)
Glucose, Bld: 245 mg/dL — ABNORMAL HIGH (ref 70–99)
Potassium: 5.4 mmol/L — ABNORMAL HIGH (ref 3.5–5.1)
Sodium: 141 mmol/L (ref 135–145)

## 2020-10-14 LAB — BLOOD GAS, ARTERIAL
Acid-Base Excess: 8.1 mmol/L — ABNORMAL HIGH (ref 0.0–2.0)
Bicarbonate: 33.9 mmol/L — ABNORMAL HIGH (ref 20.0–28.0)
FIO2: 45
O2 Saturation: 96.8 %
Patient temperature: 36.3
pCO2 arterial: 62.1 mmHg — ABNORMAL HIGH (ref 32.0–48.0)
pH, Arterial: 7.352 (ref 7.350–7.450)
pO2, Arterial: 80.3 mmHg — ABNORMAL LOW (ref 83.0–108.0)

## 2020-10-14 NOTE — Progress Notes (Addendum)
Pulmonary Critical Care Medicine [facility] [place]   PULMONARY CRITICAL CARE SERVICE  PROGRESS NOTE  Date of Service: 10/14/2020  Laden Fieldhouse  JSE:[id number]  DOB: 1971/06/15   DOA: 10/11/2020  Referring Physician: Carron Curie, MD  HPI: Jamyson Jirak is a 49 y.o. male seen for follow up of Acute on Chronic Respiratory Failure.  Patient remains on full support assist-control mode rate of 34 with an FiO2 of 45%  Medications: Reviewed on Rounds  Physical Exam:  Vitals: Pulse 83 respirations 34 BP 103/67 O2 sat 100% temp 96.4  Ventilator Settings ventilator mode AC VC rate of 34 tidal volume 430 PEEP of 7 FiO2 45%  . General: Comfortable at this time . Eyes: Grossly normal lids, irises & conjunctiva . ENT: grossly tongue is normal . Neck: no obvious mass . Cardiovascular: S1 S2 normal no gallop . Respiratory: No rales or rhonchi noted . Abdomen: soft . Skin: no rash seen on limited exam . Musculoskeletal: not rigid . Psychiatric:unable to assess . Neurologic: no seizure no involuntary movements         Lab Data:   Basic Metabolic Panel: Recent Labs  Lab 10/12/20 0554 10/13/20 0928 10/14/20 0433  NA 142 143 141  K 4.3 5.1 5.4*  CL 101 103 99  CO2 28 27 33*  GLUCOSE 204* 239* 245*  BUN 34* 39* 38*  CREATININE 0.97 0.96 0.81  CALCIUM 9.1 9.2 9.2  MG  --  2.2  --     ABG: Recent Labs  Lab 10/11/20 1805 10/13/20 0735 10/13/20 1315 10/14/20 0515  PHART 7.334* 7.157* 7.275* 7.352  PCO2ART 62.3* 91.8* 72.5* 62.1*  PO2ART 98.3 78.2* 148* 80.3*  HCO3 32.2* 31.8* 33.2* 33.9*  O2SAT 97.2 94.2 99.4 96.8    Liver Function Tests: Recent Labs  Lab 10/12/20 0554 10/13/20 0928  AST 36 24  ALT 82* 64*  ALKPHOS 87 103  BILITOT 0.4 0.4  PROT 6.4* 6.7  ALBUMIN 2.6* 2.7*   No results for input(s): LIPASE, AMYLASE in the last 168 hours. No results for input(s): AMMONIA in the last 168 hours.  CBC: Recent Labs  Lab  10/12/20 0554 10/13/20 0928  WBC 16.0* 27.0*  NEUTROABS 11.8*  --   HGB 9.4* 8.4*  HCT 29.4* 26.7*  MCV 92.5 94.3  PLT 345 449*    Cardiac Enzymes: No results for input(s): CKTOTAL, CKMB, CKMBINDEX, TROPONINI in the last 168 hours.  BNP (last 3 results) No results for input(s): BNP in the last 8760 hours.  ProBNP (last 3 results) No results for input(s): PROBNP in the last 8760 hours.  Radiological Exams: DG Chest Port 1 View  Result Date: 10/13/2020 CLINICAL DATA:  Respiratory failure. EXAM: PORTABLE CHEST 1 VIEW COMPARISON:  10/11/2020 FINDINGS: Gastric tube courses below the diaphragm with the tip outside the field of view. Right PICC in similar position with tip projecting at the SVC. Tracheostomy tube in similar position, with tip projecting midline over the trachea. Similar appearance of bilateral peripheral predominant interstitial and airspace opacities. No visible pleural effusions or pneumothorax. Cardiomediastinal silhouette is within normal limits. No acute osseous abnormality. IMPRESSION: Similar bilateral opacities, compatible with multifocal pneumonia. Electronically Signed   By: Feliberto Harts MD   On: 10/13/2020 08:21    Assessment/Plan Active Problems:   Acute on chronic respiratory failure with hypoxia (HCC)   COVID-19 virus infection   Acute infective tracheobronchitis   Pneumonia due to COVID-19 virus   Tracheostomy status (HCC)   1.  Acute on chronic respiratory failure with hypoxia patient remains on full support on the ventilator is sedated at this time so no weaning will be happening.  Continue with current settings continue supportive measures. 2. COVID-19 virus infection in recovery patient has received remdesivir and Decadron. 3. Klebsiella tracheobronchitis patient had this presentation was started on Azactam 4. Tracheostomy status remains in place we will continue with supportive care. 5. COVID-19 pneumonia has been treated patient has residual  pulmonary deficits noted on the chest films.  Overall prognosis quite guarded   I have personally seen and evaluated the patient, evaluated laboratory and imaging results, formulated the assessment and plan and placed orders. The Patient requires high complexity decision making with multiple systems involvement.  Rounds were done with the Respiratory Therapy Director and Staff therapists and discussed with nursing staff also.  Yevonne Pax, MD [facility] Pulmonary Critical Care Medicine Sleep Medicine

## 2020-10-14 NOTE — Consult Note (Signed)
Ref: Gunnar Bulla, MD   Subjective:  Sedated. Improving heart rate with sinus rhythm.  Objective:  Vital Signs in the last 24 hours: BP: 129/88  P: 96 R: 28 O2 sat 100 % on 45 % FiO2.   Physical Exam: BP Readings from Last 1 Encounters:  No data found for BP     Wt Readings from Last 1 Encounters:  No data found for Wt    Weight change:  There is no height or weight on file to calculate BMI. HEENT: [place]/AT, Eyes-Blue, Conjunctiva-Pale, Sclera-Non-icteric Neck: No JVD, No bruit, Trach in place.. Lungs:  Scattered rhonchi, Bilateral. Cardiac:  Regular rhythm, normal S1 and S2, no S3. II/VI systolic murmur. Abdomen:  Soft, non-tender. BS present. Extremities:  No edema present. No cyanosis. No clubbing. CNS: AxOx0, Cranial nerves grossly intact.  Skin: Warm and dry.   Intake/Output from previous day: No intake/output data recorded.    Lab Results: BMET    Component Value Date/Time   NA 141 10/14/2020 0433   NA 143 10/13/2020 0928   NA 142 10/12/2020 0554   K 5.4 (H) 10/14/2020 0433   K 5.1 10/13/2020 0928   K 4.3 10/12/2020 0554   CL 99 10/14/2020 0433   CL 103 10/13/2020 0928   CL 101 10/12/2020 0554   CO2 33 (H) 10/14/2020 0433   CO2 27 10/13/2020 0928   CO2 28 10/12/2020 0554   GLUCOSE 245 (H) 10/14/2020 0433   GLUCOSE 239 (H) 10/13/2020 0928   GLUCOSE 204 (H) 10/12/2020 0554   BUN 38 (H) 10/14/2020 0433   BUN 39 (H) 10/13/2020 0928   BUN 34 (H) 10/12/2020 0554   CREATININE 0.81 10/14/2020 0433   CREATININE 0.96 10/13/2020 0928   CREATININE 0.97 10/12/2020 0554   CALCIUM 9.2 10/14/2020 0433   CALCIUM 9.2 10/13/2020 0928   CALCIUM 9.1 10/12/2020 0554   GFRNONAA >60 10/14/2020 0433   GFRNONAA >60 10/13/2020 0928   GFRNONAA >60 10/12/2020 0554   CBC    Component Value Date/Time   WBC 27.0 (H) 10/13/2020 0928   RBC 2.83 (L) 10/13/2020 0928   HGB 8.4 (L) 10/13/2020 0928   HCT 26.7 (L) 10/13/2020 0928   PLT 449 (H) 10/13/2020 0928   MCV 94.3  10/13/2020 0928   MCH 29.7 10/13/2020 0928   MCHC 31.5 10/13/2020 0928   RDW 15.6 (H) 10/13/2020 0928   LYMPHSABS 2.4 10/12/2020 0554   MONOABS 0.2 10/12/2020 0554   EOSABS 1.3 (H) 10/12/2020 0554   BASOSABS 0.3 (H) 10/12/2020 0554   HEPATIC Function Panel Recent Labs    10/12/20 0554 10/13/20 0928  PROT 6.4* 6.7   HEMOGLOBIN A1C No components found for: HGA1C,  MPG CARDIAC ENZYMES No results found for: CKTOTAL, CKMB, CKMBINDEX, TROPONINI BNP No results for input(s): PROBNP in the last 8760 hours. TSH No results for input(s): TSH in the last 8760 hours. CHOLESTEROL No results for input(s): CHOL in the last 8760 hours.  Scheduled Meds: Continuous Infusions: PRN Meds:.  Assessment/Plan:   Sinus tachycardia, resolving Acute on chronic respiratory failure with hypoxia COVID-19 infection Klebsiella pneumonia Sepsis CAD S/p stent placement  Continue low dose B-blocker for heart rate control.   LOS: 0 days   Time spent including chart review, lab review, examination, discussion with patient's nurse : 25 min   Orpah Cobb  MD  10/14/2020, 8:55 PM

## 2020-10-15 ENCOUNTER — Other Ambulatory Visit (HOSPITAL_COMMUNITY): Payer: BC Managed Care – PPO

## 2020-10-15 DIAGNOSIS — J9621 Acute and chronic respiratory failure with hypoxia: Secondary | ICD-10-CM | POA: Diagnosis not present

## 2020-10-15 DIAGNOSIS — J1282 Pneumonia due to coronavirus disease 2019: Secondary | ICD-10-CM | POA: Diagnosis not present

## 2020-10-15 DIAGNOSIS — U071 COVID-19: Secondary | ICD-10-CM | POA: Diagnosis not present

## 2020-10-15 DIAGNOSIS — Z930 Tracheostomy status: Secondary | ICD-10-CM | POA: Diagnosis not present

## 2020-10-15 LAB — BLOOD GAS, ARTERIAL
Acid-Base Excess: 9.5 mmol/L — ABNORMAL HIGH (ref 0.0–2.0)
Acid-Base Excess: 9.5 mmol/L — ABNORMAL HIGH (ref 0.0–2.0)
Bicarbonate: 35.9 mmol/L — ABNORMAL HIGH (ref 20.0–28.0)
Bicarbonate: 35.8 mmol/L — ABNORMAL HIGH (ref 20.0–28.0)
FIO2: 45
FIO2: 45
O2 Saturation: 98.9 %
O2 Saturation: 98.6 %
Patient temperature: 37
Patient temperature: 36.2
pCO2 arterial: 75 mmHg (ref 32.0–48.0)
pCO2 arterial: 70.5 mmHg (ref 32.0–48.0)
pH, Arterial: 7.302 — ABNORMAL LOW (ref 7.350–7.450)
pH, Arterial: 7.321 — ABNORMAL LOW (ref 7.350–7.450)
pO2, Arterial: 125 mmHg — ABNORMAL HIGH (ref 83.0–108.0)
pO2, Arterial: 108 mmHg (ref 83.0–108.0)

## 2020-10-15 LAB — POTASSIUM: Potassium: 4.9 mmol/L (ref 3.5–5.1)

## 2020-10-15 MED ORDER — IOHEXOL 350 MG/ML SOLN
60.0000 mL | Freq: Once | INTRAVENOUS | Status: AC | PRN
  Administered 2020-10-15: 60 mL via INTRAVENOUS

## 2020-10-15 NOTE — Progress Notes (Signed)
Pulmonary Critical Care Medicine [facility] [place]   PULMONARY CRITICAL CARE SERVICE  PROGRESS NOTE  Date of Service: 10/15/2020  Kirby Cortese  KGM:[id number]  DOB: [birth year]   DOA: 10/11/2020  Referring Physician: Carron Curie, MD  HPI: Lawrence Freeman is a 49 y.o. male seen for follow up of Acute on Chronic Respiratory Failure.  Patient remains sedated on the ventilator he full support currently on assist control mode.  Appears to be doing better.  Has not had an ABG which will be ordered.  Medications: Reviewed on Rounds  Physical Exam:  Vitals: Temperature is 96.8 pulse 84 respiratory rate 36 blood pressure is 113/64 saturations 100%  Ventilator Settings on assist control FiO2 is 45% tidal volume 430 PEEP 7  . General: Comfortable at this time . Eyes: Grossly normal lids, irises & conjunctiva . ENT: grossly tongue is normal . Neck: no obvious mass . Cardiovascular: S1 S2 normal no gallop . Respiratory: Scattered rhonchi expansion is equal at this time . Abdomen: soft . Skin: no rash seen on limited exam . Musculoskeletal: not rigid . Psychiatric:unable to assess . Neurologic: no seizure no involuntary movements         Lab Data:   Basic Metabolic Panel: Recent Labs  Lab 10/12/20 0554 10/13/20 0928 10/14/20 0433  NA 142 143 141  K 4.3 5.1 5.4*  CL 101 103 99  CO2 28 27 33*  GLUCOSE 204* 239* 245*  BUN 34* 39* 38*  CREATININE 0.97 0.96 0.81  CALCIUM 9.1 9.2 9.2  MG  --  2.2  --     ABG: Recent Labs  Lab 10/11/20 1805 10/13/20 0735 10/13/20 1315 10/14/20 0515  PHART 7.334* 7.157* 7.275* 7.352  PCO2ART 62.3* 91.8* 72.5* 62.1*  PO2ART 98.3 78.2* 148* 80.3*  HCO3 32.2* 31.8* 33.2* 33.9*  O2SAT 97.2 94.2 99.4 96.8    Liver Function Tests: Recent Labs  Lab 10/12/20 0554 10/13/20 0928  AST 36 24  ALT 82* 64*  ALKPHOS 87 103  BILITOT 0.4 0.4  PROT 6.4* 6.7  ALBUMIN 2.6* 2.7*   No results for input(s): LIPASE,  AMYLASE in the last 168 hours. No results for input(s): AMMONIA in the last 168 hours.  CBC: Recent Labs  Lab 10/12/20 0554 10/13/20 0928  WBC 16.0* 27.0*  NEUTROABS 11.8*  --   HGB 9.4* 8.4*  HCT 29.4* 26.7*  MCV 92.5 94.3  PLT 345 449*    Cardiac Enzymes: No results for input(s): CKTOTAL, CKMB, CKMBINDEX, TROPONINI in the last 168 hours.  BNP (last 3 results) No results for input(s): BNP in the last 8760 hours.  ProBNP (last 3 results) No results for input(s): PROBNP in the last 8760 hours.  Radiological Exams: No results found.  Assessment/Plan Active Problems:   Acute on chronic respiratory failure with hypoxia (HCC)   COVID-19 virus infection   Acute infective tracheobronchitis   Pneumonia due to COVID-19 virus   Tracheostomy status (HCC)   1. Acute on chronic respiratory failure with hypoxia we will continue with on full support on the ventilator.  Titrate drips for sedation patient appears to be hemodynamically stable at this time. 2. COVID-19 virus infection in recovery phase 3. Acute infective tracheobronchitis has been treated with antibiotics 4. Pneumonia due to COVID-19 treated slow improvement infectious diseases involved with the care of the patient 5. Tracheostomy will remain in place.  Patient is not ready for any kind of weaning at this time.   I have personally seen and  evaluated the patient, evaluated laboratory and imaging results, formulated the assessment and plan and placed orders. The Patient requires high complexity decision making with multiple systems involvement.  Rounds were done with the Respiratory Therapy Director and Staff therapists and discussed with nursing staff also.  Allyne Gee, MD [facility] Pulmonary Critical Care Medicine Sleep Medicine

## 2020-10-16 DIAGNOSIS — J982 Interstitial emphysema: Secondary | ICD-10-CM

## 2020-10-16 DIAGNOSIS — Z930 Tracheostomy status: Secondary | ICD-10-CM | POA: Diagnosis not present

## 2020-10-16 DIAGNOSIS — J209 Acute bronchitis, unspecified: Secondary | ICD-10-CM | POA: Diagnosis not present

## 2020-10-16 DIAGNOSIS — J9621 Acute and chronic respiratory failure with hypoxia: Secondary | ICD-10-CM | POA: Diagnosis not present

## 2020-10-16 DIAGNOSIS — U071 COVID-19: Secondary | ICD-10-CM | POA: Diagnosis not present

## 2020-10-16 LAB — CBC
HCT: 25.4 % — ABNORMAL LOW (ref 39.0–52.0)
Hemoglobin: 8.3 g/dL — ABNORMAL LOW (ref 13.0–17.0)
MCH: 30.3 pg (ref 26.0–34.0)
MCHC: 32.7 g/dL (ref 30.0–36.0)
MCV: 92.7 fL (ref 80.0–100.0)
Platelets: 727 10*3/uL — ABNORMAL HIGH (ref 150–400)
RBC: 2.74 MIL/uL — ABNORMAL LOW (ref 4.22–5.81)
RDW: 15.5 % (ref 11.5–15.5)
WBC: 28 10*3/uL — ABNORMAL HIGH (ref 4.0–10.5)
nRBC: 0.6 % — ABNORMAL HIGH (ref 0.0–0.2)

## 2020-10-16 LAB — BASIC METABOLIC PANEL
Anion gap: 11 (ref 5–15)
BUN: 41 mg/dL — ABNORMAL HIGH (ref 6–20)
CO2: 31 mmol/L (ref 22–32)
Calcium: 8.6 mg/dL — ABNORMAL LOW (ref 8.9–10.3)
Chloride: 98 mmol/L (ref 98–111)
Creatinine, Ser: 0.86 mg/dL (ref 0.61–1.24)
GFR, Estimated: >60 mL/min (ref >60)
Glucose, Bld: 328 mg/dL — ABNORMAL HIGH (ref 70–99)
Potassium: 5.1 mmol/L (ref 3.5–5.1)
Sodium: 140 mmol/L (ref 135–145)

## 2020-10-16 LAB — BLOOD GAS, ARTERIAL
Acid-Base Excess: 8.9 mmol/L — ABNORMAL HIGH (ref 0.0–2.0)
Acid-Base Excess: 10.3 mmol/L — ABNORMAL HIGH (ref 0.0–2.0)
Bicarbonate: 35.5 mmol/L — ABNORMAL HIGH (ref 20.0–28.0)
Bicarbonate: 36.3 mmol/L — ABNORMAL HIGH (ref 20.0–28.0)
FIO2: 45
FIO2: 35
O2 Saturation: 97.5 %
O2 Saturation: 95.3 %
Patient temperature: 36
Patient temperature: 37.1
pCO2 arterial: 73.4 mmHg (ref 32.0–48.0)
pCO2 arterial: 70.2 mmHg (ref 32.0–48.0)
pH, Arterial: 7.299 — ABNORMAL LOW (ref 7.350–7.450)
pH, Arterial: 7.334 — ABNORMAL LOW (ref 7.350–7.450)
pO2, Arterial: 92.3 mmHg (ref 83.0–108.0)
pO2, Arterial: 76.2 mmHg — ABNORMAL LOW (ref 83.0–108.0)

## 2020-10-16 LAB — SARS CORONAVIRUS 2 (TAT 6-24 HRS): SARS Coronavirus 2: NEGATIVE

## 2020-10-16 NOTE — Progress Notes (Signed)
Pulmonary Critical Care Medicine [facility] [place]   PULMONARY CRITICAL CARE SERVICE  PROGRESS NOTE  Date of Service: 10/16/2020  Faisal Stradling  YSA:[id number]  DOB: [birth year]   DOA: 10/11/2020  Referring Physician: Carron Curie, MD  HPI: Rolf Fells is a 49 y.o. male seen for follow up of Acute on Chronic Respiratory Failure.  Patient is back on the ventilator and full support also sedation was increased and was noted to be more acidotic.  Now appears to be more comfortable.  Medications: Reviewed on Rounds  Physical Exam:  Vitals: Temperature is 96.7 pulse 79 respiratory rate 35 blood pressure is 110/68 saturations 100%  Ventilator Settings on assist control FiO2 45% tidal volume 460 PEEP 8  . General: Comfortable at this time . Eyes: Grossly normal lids, irises & conjunctiva . ENT: grossly tongue is normal . Neck: no obvious mass . Cardiovascular: S1 S2 normal no gallop . Respiratory: No rhonchi no rales are noted at this time . Abdomen: soft . Skin: no rash seen on limited exam . Musculoskeletal: not rigid . Psychiatric:unable to assess . Neurologic: no seizure no involuntary movements         Lab Data:   Basic Metabolic Panel: Recent Labs  Lab 10/12/20 0554 10/13/20 0928 10/14/20 0433 10/15/20 1647 10/16/20 0457  NA 142 143 141  --  140  K 4.3 5.1 5.4* 4.9 5.1  CL 101 103 99  --  98  CO2 28 27 33*  --  31  GLUCOSE 204* 239* 245*  --  328*  BUN 34* 39* 38*  --  41*  CREATININE 0.97 0.96 0.81  --  0.86  CALCIUM 9.1 9.2 9.2  --  8.6*  MG  --  2.2  --   --   --     ABG: Recent Labs  Lab 10/13/20 1315 10/14/20 0515 10/15/20 1118 10/15/20 1750 10/16/20 0535  PHART 7.275* 7.352 7.302* 7.321* 7.299*  PCO2ART 72.5* 62.1* 75.0* 70.5* 73.4*  PO2ART 148* 80.3* 125* 108 92.3  HCO3 33.2* 33.9* 35.9* 35.8* 35.5*  O2SAT 99.4 96.8 98.9 98.6 97.5    Liver Function Tests: Recent Labs  Lab 10/12/20 0554 10/13/20 0928  AST  36 24  ALT 82* 64*  ALKPHOS 87 103  BILITOT 0.4 0.4  PROT 6.4* 6.7  ALBUMIN 2.6* 2.7*   No results for input(s): LIPASE, AMYLASE in the last 168 hours. No results for input(s): AMMONIA in the last 168 hours.  CBC: Recent Labs  Lab 10/12/20 0554 10/13/20 0928 10/16/20 0457  WBC 16.0* 27.0* 28.0*  NEUTROABS 11.8*  --   --   HGB 9.4* 8.4* 8.3*  HCT 29.4* 26.7* 25.4*  MCV 92.5 94.3 92.7  PLT 345 449* 727*    Cardiac Enzymes: No results for input(s): CKTOTAL, CKMB, CKMBINDEX, TROPONINI in the last 168 hours.  BNP (last 3 results) No results for input(s): BNP in the last 8760 hours.  ProBNP (last 3 results) No results for input(s): PROBNP in the last 8760 hours.  Radiological Exams: CT ABDOMEN WO CONTRAST  Result Date: 10/15/2020 CLINICAL DATA:  Preop planning for gastrostomy placement EXAM: CT ABDOMEN WITHOUT CONTRAST TECHNIQUE: Multidetector CT imaging of the abdomen was performed following the standard protocol without IV contrast. COMPARISON:  None. FINDINGS: Lower chest: Coronary calcifications. No pleural or pericardial effusion. Extensive pneumomediastinum. Peripheral ground-glass opacities in the lung bases. Consolidation/atelectasis posteriorly with bronchiectasis. Hepatobiliary: No focal liver abnormality is seen. No gallstones, gallbladder wall thickening, or biliary dilatation.  Pancreas: Unremarkable. No pancreatic ductal dilatation or surrounding inflammatory changes. Spleen: Normal in size without focal abnormality. Adrenals/Urinary Tract: Adrenal glands unremarkable. Right kidney unremarkable. 2 mm calculus mid left renal collecting system. No hydronephrosis. Stomach/Bowel: Nasogastric tube extends to the proximal duodenum. The stomach is incompletely distended. There is safe anterior window for percutaneous gastrostomy placement. Visualized portions of small bowel and colon are unremarkable. Vascular/Lymphatic: Scattered atheromatous aortic calcifications without  aneurysm. No mesenteric or retroperitoneal adenopathy. Other: No ascites.  No free air. Musculoskeletal: No acute or significant osseous findings. IMPRESSION: 1. There is safe anterior window for percutaneous gastrostomy placement. 2. Extensive pneumomediastinum. 3. Left nephrolithiasis without hydronephrosis. 4. Coronary and Aortic Atherosclerosis (ICD10-I70.0). Electronically Signed   By: Corlis Leak M.D.   On: 10/15/2020 14:37   CT ANGIO CHEST PE W OR WO CONTRAST  Result Date: 10/15/2020 CLINICAL DATA:  Recovering from COVID-19 EXAM: CT ANGIOGRAPHY CHEST WITH CONTRAST TECHNIQUE: Multidetector CT imaging of the chest was performed using the standard protocol during bolus administration of intravenous contrast. Multiplanar CT image reconstructions and MIPs were obtained to evaluate the vascular anatomy. CONTRAST:  6mL OMNIPAQUE IOHEXOL 350 MG/ML SOLN COMPARISON:  None. FINDINGS: Cardiovascular: Right arm PICC line extends to the SVC. Heart size normal. No pericardial effusion. The RV is nondilated. Satisfactory opacification of pulmonary arteries noted, and there is no evidence of pulmonary emboli. Coronary calcifications. Adequate contrast opacification of the thoracic aorta with no evidence of dissection, aneurysm, or stenosis. There is classic 3-vessel brachiocephalic arch anatomy without proximal stenosis. Mediastinum/Nodes: Tracheostomy device projects in expected location. Gastric tube extends to the stomach, tip not seen. Pneumomediastinum. No mass or adenopathy. Lungs/Pleura: No pleural effusion. No pneumothorax. Bronchiectasis posteriorly in both lower lobes scattered ground-glass opacities throughout both lungs with subpleural consolidation/atelectasis peripherally in a predominantly dependent distribution. Upper Abdomen: Gastric tube to the proximal duodenum. Atheromatous aorta. 2 mm left renal calculus without hydronephrosis. No acute findings. Musculoskeletal: No chest wall abnormality. No acute  or significant osseous findings. Review of the MIP images confirms the above findings. IMPRESSION: 1. Negative for acute PE or thoracic aortic dissection. 2. Pneumomediastinum. 3. Bilateral lower lobe bronchiectasis and scattered ground-glass opacities with subpleural consolidation/atelectasis peripherally in both lungs, consistent with post COVID change. 4. Coronary and Aortic Atherosclerosis (ICD10-I70.0). Electronically Signed   By: Corlis Leak M.D.   On: 10/15/2020 14:15    Assessment/Plan Active Problems:   Acute on chronic respiratory failure with hypoxia (HCC)   COVID-19 virus infection   Acute infective tracheobronchitis   Pneumonia due to COVID-19 virus   Tracheostomy status (HCC)   1. Acute on chronic respiratory failure hypoxia we will continue with full support on the ventilator follow-up ABG will be ordered. 2. COVID-19 virus infection in recovery phase we will continue with supportive care. 3. Acute infective tracheobronchitis supportive care has been on antibiotics secretions noted to be increased. 4. Pneumonia has been treated patient showing some bilateral bronchiectasis along with some groundglass opacities.  In addition CT scan showed pneumomediastinum 5. Tracheostomy will remain in place we will continue to follow along.   I have personally seen and evaluated the patient, evaluated laboratory and imaging results, formulated the assessment and plan and placed orders. The Patient requires high complexity decision making with multiple systems involvement.  Rounds were done with the Respiratory Therapy Director and Staff therapists and discussed with nursing staff also.  Time 35 minutes patient is critically ill in danger of cardiac arrest and death  Yevonne Pax, MD [facility]  Pulmonary Critical Care Medicine Sleep Medicine

## 2020-10-17 DIAGNOSIS — Z930 Tracheostomy status: Secondary | ICD-10-CM | POA: Diagnosis not present

## 2020-10-17 DIAGNOSIS — J9621 Acute and chronic respiratory failure with hypoxia: Secondary | ICD-10-CM | POA: Diagnosis not present

## 2020-10-17 DIAGNOSIS — J209 Acute bronchitis, unspecified: Secondary | ICD-10-CM | POA: Diagnosis not present

## 2020-10-17 DIAGNOSIS — U071 COVID-19: Secondary | ICD-10-CM | POA: Diagnosis not present

## 2020-10-17 LAB — CBC WITH DIFFERENTIAL/PLATELET
Abs Immature Granulocytes: 1.87 10*3/uL — ABNORMAL HIGH (ref 0.00–0.07)
Basophils Absolute: 0.1 10*3/uL (ref 0.0–0.1)
Basophils Relative: 0 %
Eosinophils Absolute: 0 10*3/uL (ref 0.0–0.5)
Eosinophils Relative: 0 %
HCT: 26.8 % — ABNORMAL LOW (ref 39.0–52.0)
Hemoglobin: 8.5 g/dL — ABNORMAL LOW (ref 13.0–17.0)
Immature Granulocytes: 8 %
Lymphocytes Relative: 11 %
Lymphs Abs: 2.7 10*3/uL (ref 0.7–4.0)
MCH: 29.4 pg (ref 26.0–34.0)
MCHC: 31.7 g/dL (ref 30.0–36.0)
MCV: 92.7 fL (ref 80.0–100.0)
Monocytes Absolute: 0.6 10*3/uL (ref 0.1–1.0)
Monocytes Relative: 2 %
Neutro Abs: 19.7 10*3/uL — ABNORMAL HIGH (ref 1.7–7.7)
Neutrophils Relative %: 79 %
Platelets: 855 10*3/uL — ABNORMAL HIGH (ref 150–400)
RBC: 2.89 MIL/uL — ABNORMAL LOW (ref 4.22–5.81)
RDW: 15.7 % — ABNORMAL HIGH (ref 11.5–15.5)
WBC: 24.9 10*3/uL — ABNORMAL HIGH (ref 4.0–10.5)
nRBC: 1.6 % — ABNORMAL HIGH (ref 0.0–0.2)

## 2020-10-17 LAB — BLOOD GAS, ARTERIAL
Acid-Base Excess: 12.3 mmol/L — ABNORMAL HIGH (ref 0.0–2.0)
Bicarbonate: 38 mmol/L — ABNORMAL HIGH (ref 20.0–28.0)
FIO2: 40
O2 Saturation: 97.7 %
Patient temperature: 37
pCO2 arterial: 67.5 mmHg (ref 32.0–48.0)
pH, Arterial: 7.369 (ref 7.350–7.450)
pO2, Arterial: 92.7 mmHg (ref 83.0–108.0)

## 2020-10-17 LAB — BASIC METABOLIC PANEL
Anion gap: 10 (ref 5–15)
BUN: 33 mg/dL — ABNORMAL HIGH (ref 6–20)
CO2: 33 mmol/L — ABNORMAL HIGH (ref 22–32)
Calcium: 9 mg/dL (ref 8.9–10.3)
Chloride: 97 mmol/L — ABNORMAL LOW (ref 98–111)
Creatinine, Ser: 0.72 mg/dL (ref 0.61–1.24)
GFR, Estimated: >60 mL/min (ref >60)
Glucose, Bld: 261 mg/dL — ABNORMAL HIGH (ref 70–99)
Potassium: 4.6 mmol/L (ref 3.5–5.1)
Sodium: 140 mmol/L (ref 135–145)

## 2020-10-17 LAB — PROTIME-INR
INR: 1.1 (ref 0.8–1.2)
Prothrombin Time: 13.5 seconds (ref 11.4–15.2)

## 2020-10-17 NOTE — Progress Notes (Signed)
Pulmonary Critical Care Medicine [facility] [place]   PULMONARY CRITICAL CARE SERVICE  PROGRESS NOTE  Date of Service: 10/17/2020  Lawrence Freeman  CZY:[id number]  DOB: 04/11/1971   DOA: 10/11/2020  Referring Physician: Carron Curie, MD  HPI: Lawrence Freeman is a 49 y.o. male seen for follow up of Acute on Chronic Respiratory Failure.  Patient with comfortable more awake this morning.  Remains on the ventilator and assist control mode ABG yesterday looked a lot better.  Right now is on 40% FiO2.  Medications: Reviewed on Rounds  Physical Exam:  Vitals: Temperature is 96.3 pulse 59 respiratory rate 34 blood pressure is 109/70 saturations 99%  Ventilator Settings on assist control FiO2 is 40% PEEP 8 tidal volume 460  . General: Comfortable at this time . Eyes: Grossly normal lids, irises & conjunctiva . ENT: grossly tongue is normal . Neck: no obvious mass . Cardiovascular: S1 S2 normal no gallop . Respiratory: No rhonchi very coarse breath sounds . Abdomen: soft . Skin: no rash seen on limited exam . Musculoskeletal: not rigid . Psychiatric:unable to assess . Neurologic: no seizure no involuntary movements         Lab Data:   Basic Metabolic Panel: Recent Labs  Lab 10/12/20 0554 10/12/20 0554 10/13/20 0928 10/14/20 0433 10/15/20 1647 10/16/20 0457 10/17/20 0728  NA 142  --  143 141  --  140 140  K 4.3   < > 5.1 5.4* 4.9 5.1 4.6  CL 101  --  103 99  --  98 97*  CO2 28  --  27 33*  --  31 33*  GLUCOSE 204*  --  239* 245*  --  328* 261*  BUN 34*  --  39* 38*  --  41* 33*  CREATININE 0.97  --  0.96 0.81  --  0.86 0.72  CALCIUM 9.1  --  9.2 9.2  --  8.6* 9.0  MG  --   --  2.2  --   --   --   --    < > = values in this interval not displayed.    ABG: Recent Labs  Lab 10/14/20 0515 10/15/20 1118 10/15/20 1750 10/16/20 0535 10/16/20 1848  PHART 7.352 7.302* 7.321* 7.299* 7.334*  PCO2ART 62.1* 75.0* 70.5* 73.4* 70.2*  PO2ART 80.3*  125* 108 92.3 76.2*  HCO3 33.9* 35.9* 35.8* 35.5* 36.3*  O2SAT 96.8 98.9 98.6 97.5 95.3    Liver Function Tests: Recent Labs  Lab 10/12/20 0554 10/13/20 0928  AST 36 24  ALT 82* 64*  ALKPHOS 87 103  BILITOT 0.4 0.4  PROT 6.4* 6.7  ALBUMIN 2.6* 2.7*   No results for input(s): LIPASE, AMYLASE in the last 168 hours. No results for input(s): AMMONIA in the last 168 hours.  CBC: Recent Labs  Lab 10/12/20 0554 10/13/20 0928 10/16/20 0457 10/17/20 0728  WBC 16.0* 27.0* 28.0* 24.9*  NEUTROABS 11.8*  --   --  19.7*  HGB 9.4* 8.4* 8.3* 8.5*  HCT 29.4* 26.7* 25.4* 26.8*  MCV 92.5 94.3 92.7 92.7  PLT 345 449* 727* 855*    Cardiac Enzymes: No results for input(s): CKTOTAL, CKMB, CKMBINDEX, TROPONINI in the last 168 hours.  BNP (last 3 results) No results for input(s): BNP in the last 8760 hours.  ProBNP (last 3 results) No results for input(s): PROBNP in the last 8760 hours.  Radiological Exams: CT ABDOMEN WO CONTRAST  Result Date: 10/15/2020 CLINICAL DATA:  Preop planning for gastrostomy placement  EXAM: CT ABDOMEN WITHOUT CONTRAST TECHNIQUE: Multidetector CT imaging of the abdomen was performed following the standard protocol without IV contrast. COMPARISON:  None. FINDINGS: Lower chest: Coronary calcifications. No pleural or pericardial effusion. Extensive pneumomediastinum. Peripheral ground-glass opacities in the lung bases. Consolidation/atelectasis posteriorly with bronchiectasis. Hepatobiliary: No focal liver abnormality is seen. No gallstones, gallbladder wall thickening, or biliary dilatation. Pancreas: Unremarkable. No pancreatic ductal dilatation or surrounding inflammatory changes. Spleen: Normal in size without focal abnormality. Adrenals/Urinary Tract: Adrenal glands unremarkable. Right kidney unremarkable. 2 mm calculus mid left renal collecting system. No hydronephrosis. Stomach/Bowel: Nasogastric tube extends to the proximal duodenum. The stomach is incompletely  distended. There is safe anterior window for percutaneous gastrostomy placement. Visualized portions of small bowel and colon are unremarkable. Vascular/Lymphatic: Scattered atheromatous aortic calcifications without aneurysm. No mesenteric or retroperitoneal adenopathy. Other: No ascites.  No free air. Musculoskeletal: No acute or significant osseous findings. IMPRESSION: 1. There is safe anterior window for percutaneous gastrostomy placement. 2. Extensive pneumomediastinum. 3. Left nephrolithiasis without hydronephrosis. 4. Coronary and Aortic Atherosclerosis (ICD10-I70.0). Electronically Signed   By: Corlis Leak M.D.   On: 10/15/2020 14:37   CT ANGIO CHEST PE W OR WO CONTRAST  Result Date: 10/15/2020 CLINICAL DATA:  Recovering from COVID-19 EXAM: CT ANGIOGRAPHY CHEST WITH CONTRAST TECHNIQUE: Multidetector CT imaging of the chest was performed using the standard protocol during bolus administration of intravenous contrast. Multiplanar CT image reconstructions and MIPs were obtained to evaluate the vascular anatomy. CONTRAST:  13mL OMNIPAQUE IOHEXOL 350 MG/ML SOLN COMPARISON:  None. FINDINGS: Cardiovascular: Right arm PICC line extends to the SVC. Heart size normal. No pericardial effusion. The RV is nondilated. Satisfactory opacification of pulmonary arteries noted, and there is no evidence of pulmonary emboli. Coronary calcifications. Adequate contrast opacification of the thoracic aorta with no evidence of dissection, aneurysm, or stenosis. There is classic 3-vessel brachiocephalic arch anatomy without proximal stenosis. Mediastinum/Nodes: Tracheostomy device projects in expected location. Gastric tube extends to the stomach, tip not seen. Pneumomediastinum. No mass or adenopathy. Lungs/Pleura: No pleural effusion. No pneumothorax. Bronchiectasis posteriorly in both lower lobes scattered ground-glass opacities throughout both lungs with subpleural consolidation/atelectasis peripherally in a predominantly  dependent distribution. Upper Abdomen: Gastric tube to the proximal duodenum. Atheromatous aorta. 2 mm left renal calculus without hydronephrosis. No acute findings. Musculoskeletal: No chest wall abnormality. No acute or significant osseous findings. Review of the MIP images confirms the above findings. IMPRESSION: 1. Negative for acute PE or thoracic aortic dissection. 2. Pneumomediastinum. 3. Bilateral lower lobe bronchiectasis and scattered ground-glass opacities with subpleural consolidation/atelectasis peripherally in both lungs, consistent with post COVID change. 4. Coronary and Aortic Atherosclerosis (ICD10-I70.0). Electronically Signed   By: Corlis Leak M.D.   On: 10/15/2020 14:15    Assessment/Plan Active Problems:   Acute on chronic respiratory failure with hypoxia (HCC)   COVID-19 virus infection   Acute infective tracheobronchitis   Pneumonia due to COVID-19 virus   Tracheostomy status (HCC)   1. Acute on chronic respiratory failure hypoxia we will continue with assessing for weaning readiness.  Respiratory therapy will check the RSB I mechanics 2. COVID-19 virus infection in recovery we will continue to follow along. 3. Acute infective tracheobronchitis treated with antibiotics we will continue to follow. 4. Pneumomediastinum noted on the CT conservative management. 5. Pneumonia due to COVID-19 treated we will continue to follow 6. Tracheostomy right now will remain in place   I have personally seen and evaluated the patient, evaluated laboratory and imaging results, formulated the assessment and  plan and placed orders. The Patient requires high complexity decision making with multiple systems involvement.  Rounds were done with the Respiratory Therapy Director and Staff therapists and discussed with nursing staff also.  Allyne Gee, MD [facility] Pulmonary Critical Care Medicine Sleep Medicine

## 2020-10-18 DIAGNOSIS — Z930 Tracheostomy status: Secondary | ICD-10-CM | POA: Diagnosis not present

## 2020-10-18 DIAGNOSIS — J9621 Acute and chronic respiratory failure with hypoxia: Secondary | ICD-10-CM | POA: Diagnosis not present

## 2020-10-18 DIAGNOSIS — U071 COVID-19: Secondary | ICD-10-CM | POA: Diagnosis not present

## 2020-10-18 DIAGNOSIS — J1282 Pneumonia due to coronavirus disease 2019: Secondary | ICD-10-CM | POA: Diagnosis not present

## 2020-10-18 LAB — CULTURE, BLOOD (ROUTINE X 2)
Culture: NO GROWTH
Culture: NO GROWTH

## 2020-10-18 NOTE — Progress Notes (Signed)
Pulmonary Critical Care Medicine [facility] [place]   PULMONARY CRITICAL CARE SERVICE  PROGRESS NOTE  Date of Service: 10/18/2020  Lawrence Freeman  WIO:[id number]  DOB: Mar 30, 1971   DOA: 10/11/2020  Referring Physician: Carron Curie, MD  HPI: Lawrence Freeman is a 49 y.o. male seen for follow up of Acute on Chronic Respiratory Failure.  Patient was attempted on pressure support earlier did not tolerate we will going to reassess and try once again on pressure support.  Right now is on pressure assist control mode  Medications: Reviewed on Rounds  Physical Exam:  Vitals: Temperature is 92.7 pulse 77 respiratory rate 36 blood pressure is 119/86 saturations 95%  Ventilator Settings on pressure assist control FiO2 is 35% tidal volume 423 IP 22 PEEP 6  . General: Comfortable at this time . Eyes: Grossly normal lids, irises & conjunctiva . ENT: grossly tongue is normal . Neck: no obvious mass . Cardiovascular: S1 S2 normal no gallop . Respiratory: Scattered rhonchi very coarse breath sounds noted . Abdomen: soft . Skin: no rash seen on limited exam . Musculoskeletal: not rigid . Psychiatric:unable to assess . Neurologic: no seizure no involuntary movements         Lab Data:   Basic Metabolic Panel: Recent Labs  Lab 10/12/20 0554 10/12/20 0554 10/13/20 0928 10/14/20 0433 10/15/20 1647 10/16/20 0457 10/17/20 0728  NA 142  --  143 141  --  140 140  K 4.3   < > 5.1 5.4* 4.9 5.1 4.6  CL 101  --  103 99  --  98 97*  CO2 28  --  27 33*  --  31 33*  GLUCOSE 204*  --  239* 245*  --  328* 261*  BUN 34*  --  39* 38*  --  41* 33*  CREATININE 0.97  --  0.96 0.81  --  0.86 0.72  CALCIUM 9.1  --  9.2 9.2  --  8.6* 9.0  MG  --   --  2.2  --   --   --   --    < > = values in this interval not displayed.    ABG: Recent Labs  Lab 10/15/20 1118 10/15/20 1750 10/16/20 0535 10/16/20 1848 10/17/20 1315  PHART 7.302* 7.321* 7.299* 7.334* 7.369  PCO2ART  75.0* 70.5* 73.4* 70.2* 67.5*  PO2ART 125* 108 92.3 76.2* 92.7  HCO3 35.9* 35.8* 35.5* 36.3* 38.0*  O2SAT 98.9 98.6 97.5 95.3 97.7    Liver Function Tests: Recent Labs  Lab 10/12/20 0554 10/13/20 0928  AST 36 24  ALT 82* 64*  ALKPHOS 87 103  BILITOT 0.4 0.4  PROT 6.4* 6.7  ALBUMIN 2.6* 2.7*   No results for input(s): LIPASE, AMYLASE in the last 168 hours. No results for input(s): AMMONIA in the last 168 hours.  CBC: Recent Labs  Lab 10/12/20 0554 10/13/20 0928 10/16/20 0457 10/17/20 0728  WBC 16.0* 27.0* 28.0* 24.9*  NEUTROABS 11.8*  --   --  19.7*  HGB 9.4* 8.4* 8.3* 8.5*  HCT 29.4* 26.7* 25.4* 26.8*  MCV 92.5 94.3 92.7 92.7  PLT 345 449* 727* 855*    Cardiac Enzymes: No results for input(s): CKTOTAL, CKMB, CKMBINDEX, TROPONINI in the last 168 hours.  BNP (last 3 results) No results for input(s): BNP in the last 8760 hours.  ProBNP (last 3 results) No results for input(s): PROBNP in the last 8760 hours.  Radiological Exams: No results found.  Assessment/Plan Active Problems:   Acute  on chronic respiratory failure with hypoxia (HCC)   COVID-19 virus infection   Acute infective tracheobronchitis   Pneumonia due to COVID-19 virus   Tracheostomy status (HCC)   1. Acute on chronic respiratory failure with hypoxia plan is to continue with attempting weans.  Patient is noted to be having a low blood pressure can asked the nurses to recheck that. 2. COVID-19 virus infection in recovery we will continue with supportive care 3. Acute bronchial tracheitis supportive care 4. Pneumonia due to COVID-19 in recovery 5. Tracheostomy will remain in place   I have personally seen and evaluated the patient, evaluated laboratory and imaging results, formulated the assessment and plan and placed orders. The Patient requires high complexity decision making with multiple systems involvement.  Rounds were done with the Respiratory Therapy Director and Staff therapists and  discussed with nursing staff also.  Yevonne Pax, MD [facility] Pulmonary Critical Care Medicine Sleep Medicine

## 2020-10-18 NOTE — Consult Note (Signed)
Chief Complaint: Patient was seen in consultation today for gastrostomy tube placement  Referring Physician(s): Dr. Sharyon Medicus   Supervising Physician: Dr. Elby Showers  Patient Status: [facility]   History of Present Illness: Lawrence Freeman is a 49 y.o. male with a medical history significant for CAD, HTN and COVID-19 pneumonia with subsequent chronic respiratory failure and development of a pneumomediastinum. The patient initially required high flow oxygen, then BiPAP followed by intubation/ventilator support. He received a tracheostomy 09/20/20. Patient remains on ventilator support and has been receiving nutrition via an NG tube.   Interventional Radiology has been asked to evaluate this patient for an image-guided gastrostomy tube placement to better facilitate his long-term nutritional needs. This case has been reviewed and procedure approved by Dr. Elby Showers.   Past Medical History:  Diagnosis Date  . Acute infective tracheobronchitis   . Acute on chronic respiratory failure with hypoxia (HCC)   . COVID-19 virus infection   . Pneumonia due to COVID-19 virus   . Tracheostomy status (HCC)     Allergies: Patient has no allergy information on record.  Medications: Prior to Admission medications   Not on File     No family history on file.  Social History   Socioeconomic History  . Marital status: Married    Spouse name: Not on file  . Number of children: Not on file  . Years of education: Not on file  . Highest education level: Not on file  Occupational History  . Not on file  Tobacco Use  . Smoking status: Not on file  Substance and Sexual Activity  . Alcohol use: Not on file  . Drug use: Not on file  . Sexual activity: Not on file  Other Topics Concern  . Not on file  Social History Narrative  . Not on file   Social Determinants of Health   Financial Resource Strain:   . Difficulty of Paying Living Expenses: Not on file  Food Insecurity:     . Worried About Programme researcher, broadcasting/film/video in the Last Year: Not on file  . Ran Out of Food in the Last Year: Not on file  Transportation Needs:   . Lack of Transportation (Medical): Not on file  . Lack of Transportation (Non-Medical): Not on file  Physical Activity:   . Days of Exercise per Week: Not on file  . Minutes of Exercise per Session: Not on file  Stress:   . Feeling of Stress : Not on file  Social Connections:   . Frequency of Communication with Friends and Family: Not on file  . Frequency of Social Gatherings with Friends and Family: Not on file  . Attends Religious Services: Not on file  . Active Member of Clubs or Organizations: Not on file  . Attends Banker Meetings: Not on file  . Marital Status: Not on file    Review of Systems: A 12 point ROS discussed and pertinent positives are indicated in the HPI above.  All other systems are negative.  Review of Systems  Unable to perform ROS: Acuity of condition    Vital Signs: Temp: 98.6  HR 67  BP 125/74  RR 20  O2 saturation 97 % on ventilator (35% FiO2, PEEP 5)  Physical Exam Constitutional:      Appearance: He is ill-appearing.     Comments: Eyes intermittently open. Versed and Fentanyl drips infusing.   HENT:     Nose:     Comments: Cor-trak in place  Mouth/Throat:     Mouth: Mucous membranes are moist.     Pharynx: Oropharynx is clear.  Cardiovascular:     Rate and Rhythm: Normal rate and regular rhythm.     Pulses: Normal pulses.     Heart sounds: Normal heart sounds.     Comments: RUQ PICC Pulmonary:     Breath sounds: Normal breath sounds.     Comments: Tracheostomy/ventilator Abdominal:     General: Bowel sounds are normal.     Palpations: Abdomen is soft.  Skin:    General: Skin is warm and dry.  Neurological:     Comments: Unable to fully assess. Patient made intermittent eye contact during assessment.      Imaging: CT ABDOMEN WO CONTRAST  Result Date: 10/15/2020 CLINICAL  DATA:  Preop planning for gastrostomy placement EXAM: CT ABDOMEN WITHOUT CONTRAST TECHNIQUE: Multidetector CT imaging of the abdomen was performed following the standard protocol without IV contrast. COMPARISON:  None. FINDINGS: Lower chest: Coronary calcifications. No pleural or pericardial effusion. Extensive pneumomediastinum. Peripheral ground-glass opacities in the lung bases. Consolidation/atelectasis posteriorly with bronchiectasis. Hepatobiliary: No focal liver abnormality is seen. No gallstones, gallbladder wall thickening, or biliary dilatation. Pancreas: Unremarkable. No pancreatic ductal dilatation or surrounding inflammatory changes. Spleen: Normal in size without focal abnormality. Adrenals/Urinary Tract: Adrenal glands unremarkable. Right kidney unremarkable. 2 mm calculus mid left renal collecting system. No hydronephrosis. Stomach/Bowel: Nasogastric tube extends to the proximal duodenum. The stomach is incompletely distended. There is safe anterior window for percutaneous gastrostomy placement. Visualized portions of small bowel and colon are unremarkable. Vascular/Lymphatic: Scattered atheromatous aortic calcifications without aneurysm. No mesenteric or retroperitoneal adenopathy. Other: No ascites.  No free air. Musculoskeletal: No acute or significant osseous findings. IMPRESSION: 1. There is safe anterior window for percutaneous gastrostomy placement. 2. Extensive pneumomediastinum. 3. Left nephrolithiasis without hydronephrosis. 4. Coronary and Aortic Atherosclerosis (ICD10-I70.0). Electronically Signed   By: Corlis Leak  Hassell M.D.   On: 10/15/2020 14:37   CT ANGIO CHEST PE W OR WO CONTRAST  Result Date: 10/15/2020 CLINICAL DATA:  Recovering from COVID-19 EXAM: CT ANGIOGRAPHY CHEST WITH CONTRAST TECHNIQUE: Multidetector CT imaging of the chest was performed using the standard protocol during bolus administration of intravenous contrast. Multiplanar CT image reconstructions and MIPs were obtained  to evaluate the vascular anatomy. CONTRAST:  60mL OMNIPAQUE IOHEXOL 350 MG/ML SOLN COMPARISON:  None. FINDINGS: Cardiovascular: Right arm PICC line extends to the SVC. Heart size normal. No pericardial effusion. The RV is nondilated. Satisfactory opacification of pulmonary arteries noted, and there is no evidence of pulmonary emboli. Coronary calcifications. Adequate contrast opacification of the thoracic aorta with no evidence of dissection, aneurysm, or stenosis. There is classic 3-vessel brachiocephalic arch anatomy without proximal stenosis. Mediastinum/Nodes: Tracheostomy device projects in expected location. Gastric tube extends to the stomach, tip not seen. Pneumomediastinum. No mass or adenopathy. Lungs/Pleura: No pleural effusion. No pneumothorax. Bronchiectasis posteriorly in both lower lobes scattered ground-glass opacities throughout both lungs with subpleural consolidation/atelectasis peripherally in a predominantly dependent distribution. Upper Abdomen: Gastric tube to the proximal duodenum. Atheromatous aorta. 2 mm left renal calculus without hydronephrosis. No acute findings. Musculoskeletal: No chest wall abnormality. No acute or significant osseous findings. Review of the MIP images confirms the above findings. IMPRESSION: 1. Negative for acute PE or thoracic aortic dissection. 2. Pneumomediastinum. 3. Bilateral lower lobe bronchiectasis and scattered ground-glass opacities with subpleural consolidation/atelectasis peripherally in both lungs, consistent with post COVID change. 4. Coronary and Aortic Atherosclerosis (ICD10-I70.0). Electronically Signed   By: Corlis Leak  Hassell  M.D.   On: 10/15/2020 14:15   DG Chest Port 1 View  Result Date: 10/13/2020 CLINICAL DATA:  Respiratory failure. EXAM: PORTABLE CHEST 1 VIEW COMPARISON:  10/11/2020 FINDINGS: Gastric tube courses below the diaphragm with the tip outside the field of view. Right PICC in similar position with tip projecting at the SVC. Tracheostomy  tube in similar position, with tip projecting midline over the trachea. Similar appearance of bilateral peripheral predominant interstitial and airspace opacities. No visible pleural effusions or pneumothorax. Cardiomediastinal silhouette is within normal limits. No acute osseous abnormality. IMPRESSION: Similar bilateral opacities, compatible with multifocal pneumonia. Electronically Signed   By: Feliberto Harts MD   On: 10/13/2020 08:21   DG CHEST PORT 1 VIEW  Result Date: 10/11/2020 CLINICAL DATA:  Pneumonia EXAM: PORTABLE CHEST 1 VIEW COMPARISON:  Portable exam 1751 hours without priors for comparison FINDINGS: Tracheostomy tube projects over tracheal air column. Nasogastric tube extends into stomach with tip projecting over distal antrum/pyloric region. RIGHT arm PICC line with tip projecting over SVC. Normal heart size mediastinal contours. Diffuse BILATERAL pulmonary infiltrates consistent with multifocal pneumonia. No pleural effusion or pneumothorax. IMPRESSION: BILATERAL pulmonary infiltrates consistent with multifocal pneumonia. Electronically Signed   By: Ulyses Southward M.D.   On: 10/11/2020 18:09    Labs:  CBC: Recent Labs    10/12/20 0554 10/13/20 0928 10/16/20 0457 10/17/20 0728  WBC 16.0* 27.0* 28.0* 24.9*  HGB 9.4* 8.4* 8.3* 8.5*  HCT 29.4* 26.7* 25.4* 26.8*  PLT 345 449* 727* 855*    COAGS: Recent Labs    10/17/20 0728  INR 1.1    BMP: Recent Labs    10/13/20 0928 10/13/20 0928 10/14/20 0433 10/15/20 1647 10/16/20 0457 10/17/20 0728  NA 143  --  141  --  140 140  K 5.1   < > 5.4* 4.9 5.1 4.6  CL 103  --  99  --  98 97*  CO2 27  --  33*  --  31 33*  GLUCOSE 239*  --  245*  --  328* 261*  BUN 39*  --  38*  --  41* 33*  CALCIUM 9.2  --  9.2  --  8.6* 9.0  CREATININE 0.96  --  0.81  --  0.86 0.72  GFRNONAA >60  --  >60  --  >60 >60   < > = values in this interval not displayed.    LIVER FUNCTION TESTS: Recent Labs    10/12/20 0554 10/13/20 0928    BILITOT 0.4 0.4  AST 36 24  ALT 82* 64*  ALKPHOS 87 103  PROT 6.4* 6.7  ALBUMIN 2.6* 2.7*    TUMOR MARKERS: No results for input(s): AFPTM, CEA, CA199, CHROMGRNA in the last 8760 hours.  Assessment and Plan:  COVID-19 pneumonia; chronic respiratory failure; tracheostomy/ventilator; dysphagia: [place]. Adron Bene, 49 year old male, is tentatively scheduled for an image-guided gastrostomy tube placement 10/19/20. The procedure was explained to and consent obtained from the patient's wife Tyshon Fanning. The patient will receive 60 ml of iodinated contrast via NG tube this afternoon/evening to better opacify the colon for the procedure. This information has been relayed to the bedside RN and the contrast will be delivered directly to her by an IR tech.   Risks and benefits of an image-guided gastrostomy tube placement were discussed with the patient including, but not limited to the need for a barium enema during the procedure, bleeding, infection, peritonitis and/or damage to adjacent structures.  All of the patient's questions  were answered, patient is agreeable to proceed.  The patient will be NPO/Tube feeds held after midnight. AM labs will be ordered. The patient is currently receiving IV antibiotics: Cipro 400 mg Q12 hours, Flagyl 500 mg daily.   Consent signed and in the IR control room.   Thank you for this interesting consult.  I greatly enjoyed meeting Maliki Gignac [place] and look forward to participating in their care.  A copy of this report was sent to the requesting provider on this date.  Electronically Signed: Alwyn Ren, AGACNP-BC [phone] 10/18/2020, 4:04 PM   I spent a total of 40 Minutes    in face to face in clinical consultation, greater than 50% of which was counseling/coordinating care for gastrostomy tube placement.

## 2020-10-19 ENCOUNTER — Other Ambulatory Visit (HOSPITAL_COMMUNITY): Payer: BC Managed Care – PPO

## 2020-10-19 DIAGNOSIS — J9621 Acute and chronic respiratory failure with hypoxia: Secondary | ICD-10-CM | POA: Diagnosis not present

## 2020-10-19 DIAGNOSIS — J1282 Pneumonia due to coronavirus disease 2019: Secondary | ICD-10-CM | POA: Diagnosis not present

## 2020-10-19 DIAGNOSIS — Z930 Tracheostomy status: Secondary | ICD-10-CM | POA: Diagnosis not present

## 2020-10-19 DIAGNOSIS — U071 COVID-19: Secondary | ICD-10-CM | POA: Diagnosis not present

## 2020-10-19 HISTORY — PX: IR GASTROSTOMY TUBE MOD SED: IMG625

## 2020-10-19 LAB — PROTIME-INR
INR: 1.2 (ref 0.8–1.2)
Prothrombin Time: 14.6 seconds (ref 11.4–15.2)

## 2020-10-19 LAB — CBC
HCT: 24.8 % — ABNORMAL LOW (ref 39.0–52.0)
Hemoglobin: 7.9 g/dL — ABNORMAL LOW (ref 13.0–17.0)
MCH: 30.4 pg (ref 26.0–34.0)
MCHC: 31.9 g/dL (ref 30.0–36.0)
MCV: 95.4 fL (ref 80.0–100.0)
Platelets: 768 10*3/uL — ABNORMAL HIGH (ref 150–400)
RBC: 2.6 MIL/uL — ABNORMAL LOW (ref 4.22–5.81)
RDW: 16.9 % — ABNORMAL HIGH (ref 11.5–15.5)
WBC: 20 10*3/uL — ABNORMAL HIGH (ref 4.0–10.5)
nRBC: 0.8 % — ABNORMAL HIGH (ref 0.0–0.2)

## 2020-10-19 LAB — BASIC METABOLIC PANEL
Anion gap: 10 (ref 5–15)
BUN: 39 mg/dL — ABNORMAL HIGH (ref 6–20)
CO2: 34 mmol/L — ABNORMAL HIGH (ref 22–32)
Calcium: 8.7 mg/dL — ABNORMAL LOW (ref 8.9–10.3)
Chloride: 96 mmol/L — ABNORMAL LOW (ref 98–111)
Creatinine, Ser: 0.94 mg/dL (ref 0.61–1.24)
GFR, Estimated: >60 mL/min (ref >60)
Glucose, Bld: 208 mg/dL — ABNORMAL HIGH (ref 70–99)
Potassium: 4.7 mmol/L (ref 3.5–5.1)
Sodium: 140 mmol/L (ref 135–145)

## 2020-10-19 MED ORDER — VANCOMYCIN HCL IN DEXTROSE 1-5 GM/200ML-% IV SOLN: 1000.0000 mg | Freq: Once | INTRAVENOUS | Status: AC

## 2020-10-19 MED ORDER — VANCOMYCIN HCL 1000 MG IV SOLR: 1000.0000 mg | INTRAVENOUS | Status: DC

## 2020-10-19 MED ORDER — VANCOMYCIN HCL IN DEXTROSE 1-5 GM/200ML-% IV SOLN
INTRAVENOUS | Status: AC
  Administered 2020-10-19: 1000 mg
  Filled 2020-10-19: qty 200

## 2020-10-19 MED ORDER — GLUCAGON HCL RDNA (DIAGNOSTIC) 1 MG IJ SOLR
INTRAMUSCULAR | Status: AC | PRN
  Administered 2020-10-19: .5 mg via INTRAVENOUS

## 2020-10-19 MED ORDER — LIDOCAINE HCL 1 % IJ SOLN
INTRAMUSCULAR | Status: AC
  Filled 2020-10-19: qty 20

## 2020-10-19 MED ORDER — GLUCAGON HCL RDNA (DIAGNOSTIC) 1 MG IJ SOLR
INTRAMUSCULAR | Status: AC
  Filled 2020-10-19: qty 1

## 2020-10-19 MED ORDER — LIDOCAINE HCL (PF) 1 % IJ SOLN
INTRAMUSCULAR | Status: AC | PRN
  Administered 2020-10-19: 10 mL

## 2020-10-19 MED ORDER — IOHEXOL 300 MG/ML  SOLN
50.0000 mL | Freq: Once | INTRAMUSCULAR | Status: AC | PRN
  Administered 2020-10-19: 15 mL

## 2020-10-19 NOTE — Sedation Documentation (Signed)
Did not sedate patient further. Patient is already on sedation medications. RN is on her way down from 5E and will get bedside report and transfer patient back up with respiratory. Nothing further needed.

## 2020-10-19 NOTE — Procedures (Signed)
Interventional Radiology Procedure Note  Procedure: 20 fr Gtube    Complications: None  Estimated Blood Loss:  min  Findings: Full report in pacs    Sharen Counter, MD

## 2020-10-19 NOTE — Progress Notes (Signed)
Pulmonary Critical Care Medicine [facility] [place]   PULMONARY CRITICAL CARE SERVICE  PROGRESS NOTE  Date of Service: 10/19/2020  Lawrence Freeman  JXB:[id number]  DOB: 07-Apr-1971   DOA: 10/11/2020  Referring Physician: Carron Curie, MD  HPI: Lawrence Freeman is a 49 y.o. male seen for follow up of Acute on Chronic Respiratory Failure.  Patient currently is on full support on ventilator and had a PEG placed today.  In addition patient was doing pressure support weaning 8-hour pressure support  Medications: Reviewed on Rounds  Physical Exam:  Vitals: Temperature is 97.1 pulse 70 respiratory 36 blood pressure is 115/69 saturations 98%  Ventilator Settings on pressure assist control FiO2 35% IP 22 PEEP 5  . General: Comfortable at this time . Eyes: Grossly normal lids, irises & conjunctiva . ENT: grossly tongue is normal . Neck: no obvious mass . Cardiovascular: S1 S2 normal no gallop . Respiratory: No rhonchi no rales noted at this time . Abdomen: soft . Skin: no rash seen on limited exam . Musculoskeletal: not rigid . Psychiatric:unable to assess . Neurologic: no seizure no involuntary movements         Lab Data:   Basic Metabolic Panel: Recent Labs  Lab 10/13/20 0928 10/13/20 0928 10/14/20 0433 10/15/20 1647 10/16/20 0457 10/17/20 0728 10/19/20 0402  NA 143  --  141  --  140 140 140  K 5.1   < > 5.4* 4.9 5.1 4.6 4.7  CL 103  --  99  --  98 97* 96*  CO2 27  --  33*  --  31 33* 34*  GLUCOSE 239*  --  245*  --  328* 261* 208*  BUN 39*  --  38*  --  41* 33* 39*  CREATININE 0.96  --  0.81  --  0.86 0.72 0.94  CALCIUM 9.2  --  9.2  --  8.6* 9.0 8.7*  MG 2.2  --   --   --   --   --   --    < > = values in this interval not displayed.    ABG: Recent Labs  Lab 10/15/20 1118 10/15/20 1750 10/16/20 0535 10/16/20 1848 10/17/20 1315  PHART 7.302* 7.321* 7.299* 7.334* 7.369  PCO2ART 75.0* 70.5* 73.4* 70.2* 67.5*  PO2ART 125* 108 92.3  76.2* 92.7  HCO3 35.9* 35.8* 35.5* 36.3* 38.0*  O2SAT 98.9 98.6 97.5 95.3 97.7    Liver Function Tests: Recent Labs  Lab 10/13/20 0928  AST 24  ALT 64*  ALKPHOS 103  BILITOT 0.4  PROT 6.7  ALBUMIN 2.7*   No results for input(s): LIPASE, AMYLASE in the last 168 hours. No results for input(s): AMMONIA in the last 168 hours.  CBC: Recent Labs  Lab 10/13/20 0928 10/16/20 0457 10/17/20 0728 10/19/20 0402  WBC 27.0* 28.0* 24.9* 20.0*  NEUTROABS  --   --  19.7*  --   HGB 8.4* 8.3* 8.5* 7.9*  HCT 26.7* 25.4* 26.8* 24.8*  MCV 94.3 92.7 92.7 95.4  PLT 449* 727* 855* 768*    Cardiac Enzymes: No results for input(s): CKTOTAL, CKMB, CKMBINDEX, TROPONINI in the last 168 hours.  BNP (last 3 results) No results for input(s): BNP in the last 8760 hours.  ProBNP (last 3 results) No results for input(s): PROBNP in the last 8760 hours.  Radiological Exams: IR GASTROSTOMY TUBE MOD SED  Result Date: 10/19/2020 INDICATION: Vent dependent respiratory failure, dysphagia EXAM: 20 FRENCH PULL-THROUGH GASTROSTOMY Date:  10/19/2020 10/19/2020 2:28  pm Radiologist:  M. Ruel Favors, MD Guidance:  Fluoroscopic MEDICATIONS: 1 g vancomycin; Antibiotics were administered within 1 hour of the procedure. Glucagon 0.5 mg IV ANESTHESIA/SEDATION: Moderate Sedation Time:  None. The patient was continuously monitored during the procedure by the interventional radiology nurse under my direct supervision. CONTRAST:  29mL OMNIPAQUE IOHEXOL 300 MG/ML SOLN - administered into the gastric lumen. FLUOROSCOPY TIME:  Fluoroscopy Time: 2 minutes 30 seconds (36 mGy). COMPLICATIONS: None immediate. PROCEDURE: Informed consent was obtained from the patient following explanation of the procedure, risks, benefits and alternatives. The patient understands, agrees and consents for the procedure. All questions were addressed. A time out was performed. Maximal barrier sterile technique utilized including caps, mask, sterile  gowns, sterile gloves, large sterile drape, hand hygiene, and betadine prep. The left upper quadrant was sterilely prepped and draped. An oral gastric catheter was inserted into the stomach under fluoroscopy. The existing nasogastric feeding tube was removed. Air was injected into the stomach for insufflation and visualization under fluoroscopy. The air distended stomach was confirmed beneath the anterior abdominal wall in the frontal and lateral projections. Under sterile conditions and local anesthesia, a 17 gauge trocar needle was utilized to access the stomach percutaneously beneath the left subcostal margin. Needle position was confirmed within the stomach under biplane fluoroscopy. Contrast injection confirmed position also. A single T tack was deployed for gastropexy. Over an Amplatz guide wire, a 9-French sheath was inserted into the stomach. A snare device was utilized to capture the oral gastric catheter. The snare device was pulled retrograde from the stomach up the esophagus and out the oropharynx. The 20-French pull-through gastrostomy was connected to the snare device and pulled antegrade through the oropharynx down the esophagus into the stomach and then through the percutaneous tract external to the patient. The gastrostomy was assembled externally. Contrast injection confirms position in the stomach. Images were obtained for documentation. The patient tolerated procedure well. No immediate complication. IMPRESSION: Fluoroscopic insertion of a 20-French "pull-through" gastrostomy. Electronically Signed   By: Judie Petit.  Shick M.D.   On: 10/19/2020 14:34   DG CHEST PORT 1 VIEW  Result Date: 10/19/2020 CLINICAL DATA:  Respiratory failure EXAM: PORTABLE CHEST 1 VIEW COMPARISON:  Four days ago FINDINGS: The enteric tube tip reaches the distal stomach at least. Right PICC with tip at the distal SVC. Tracheostomy tube in place Diffuse interstitial opacity with fibrotic features by CT. No visible effusion or  air leak. Normal heart size. IMPRESSION: 1. Stable interstitial from fibrotic changes by CT. 2. Unremarkable hardware positioning Electronically Signed   By: Marnee Spring M.D.   On: 10/19/2020 07:22   DG Abd Portable 1V  Result Date: 10/19/2020 CLINICAL DATA:  Evaluate barium in the bowel. Plan for gastrostomy tube placement. EXAM: PORTABLE ABDOMEN - 1 VIEW COMPARISON:  10/15/2020 FINDINGS: The bowel gas pattern is nonobstructive. No dilated loops of small bowel. There is enteric contrast throughout the colon extending from the cecum to the rectum. No residual contrast within the stomach or small bowel. AA enteric tube terminates within the distal stomach or proximal duodenum. No radio-opaque calculi or other significant radiographic abnormality are seen. IMPRESSION: 1. Enteric contrast throughout the colon extending from the cecum to the rectum. No residual contrast within the stomach or small bowel. 2. Nonobstructive bowel gas pattern. Electronically Signed   By: Duanne Guess D.O.   On: 10/19/2020 08:36    Assessment/Plan Active Problems:   Acute on chronic respiratory failure with hypoxia (HCC)   COVID-19 virus  infection   Acute infective tracheobronchitis   Pneumonia due to COVID-19 virus   Tracheostomy status (HCC)   1. Acute on chronic respiratory failure with hypoxia we will continue to wean on pressure support as tolerated. 2. COVID-19 virus infection in recovery 3. Acute infective tracheobronchitis no change continue supportive care 4. Pneumonia due to COVID-19 treated 5. Tracheostomy remains in place   I have personally seen and evaluated the patient, evaluated laboratory and imaging results, formulated the assessment and plan and placed orders. The Patient requires high complexity decision making with multiple systems involvement.  Rounds were done with the Respiratory Therapy Director and Staff therapists and discussed with nursing staff also.  Yevonne Pax, MD  [facility] Pulmonary Critical Care Medicine Sleep Medicine

## 2020-10-20 DIAGNOSIS — Z930 Tracheostomy status: Secondary | ICD-10-CM | POA: Diagnosis not present

## 2020-10-20 DIAGNOSIS — U071 COVID-19: Secondary | ICD-10-CM | POA: Diagnosis not present

## 2020-10-20 DIAGNOSIS — J9621 Acute and chronic respiratory failure with hypoxia: Secondary | ICD-10-CM | POA: Diagnosis not present

## 2020-10-20 DIAGNOSIS — J1282 Pneumonia due to coronavirus disease 2019: Secondary | ICD-10-CM | POA: Diagnosis not present

## 2020-10-20 LAB — BASIC METABOLIC PANEL
Anion gap: 9 (ref 5–15)
BUN: 34 mg/dL — ABNORMAL HIGH (ref 6–20)
CO2: 35 mmol/L — ABNORMAL HIGH (ref 22–32)
Calcium: 8.4 mg/dL — ABNORMAL LOW (ref 8.9–10.3)
Chloride: 95 mmol/L — ABNORMAL LOW (ref 98–111)
Creatinine, Ser: 0.84 mg/dL (ref 0.61–1.24)
GFR, Estimated: >60 mL/min (ref >60)
Glucose, Bld: 162 mg/dL — ABNORMAL HIGH (ref 70–99)
Potassium: 4.3 mmol/L (ref 3.5–5.1)
Sodium: 139 mmol/L (ref 135–145)

## 2020-10-20 NOTE — Progress Notes (Signed)
   G tube placed in IR 10/27  Afeb; +BS NT abd Site is clean and dry No bleeding  May use now

## 2020-10-20 NOTE — Progress Notes (Signed)
Pulmonary Critical Care Medicine [facility] [place]   PULMONARY CRITICAL CARE SERVICE  PROGRESS NOTE  Date of Service: 10/20/2020  Lawrence Freeman  NGE:[id number]  DOB: May 13, 1971   DOA: 10/11/2020  Referring Physician: Carron Curie, MD  HPI: Lawrence Freeman is a 49 y.o. male seen for follow up of Acute on Chronic Respiratory Failure.  Patient is on the ventilator still on full support and pressure control mode has been on 35% FiO2 saturations are acceptable.  Mechanics are poor at this time not being able to wean.  Had PEG placed yesterday  Medications: Reviewed on Rounds  Physical Exam:  Vitals: Temperature is 98.1 pulse 119 respiratory rate 37 blood pressure is 145/57 saturations 99%  Ventilator Settings on pressure assist control FiO2 35% tidal volume 460 PEEP 5  . General: Comfortable at this time . Eyes: Grossly normal lids, irises & conjunctiva . ENT: grossly tongue is normal . Neck: no obvious mass . Cardiovascular: S1 S2 normal no gallop . Respiratory: No rhonchi very coarse breath sounds . Abdomen: soft . Skin: no rash seen on limited exam . Musculoskeletal: not rigid . Psychiatric:unable to assess . Neurologic: no seizure no involuntary movements         Lab Data:   Basic Metabolic Panel: Recent Labs  Lab 10/14/20 0433 10/14/20 0433 10/15/20 1647 10/16/20 0457 10/17/20 0728 10/19/20 0402 10/20/20 0452  NA 141  --   --  140 140 140 139  K 5.4*   < > 4.9 5.1 4.6 4.7 4.3  CL 99  --   --  98 97* 96* 95*  CO2 33*  --   --  31 33* 34* 35*  GLUCOSE 245*  --   --  328* 261* 208* 162*  BUN 38*  --   --  41* 33* 39* 34*  CREATININE 0.81  --   --  0.86 0.72 0.94 0.84  CALCIUM 9.2  --   --  8.6* 9.0 8.7* 8.4*   < > = values in this interval not displayed.    ABG: Recent Labs  Lab 10/15/20 1118 10/15/20 1750 10/16/20 0535 10/16/20 1848 10/17/20 1315  PHART 7.302* 7.321* 7.299* 7.334* 7.369  PCO2ART 75.0* 70.5* 73.4* 70.2*  67.5*  PO2ART 125* 108 92.3 76.2* 92.7  HCO3 35.9* 35.8* 35.5* 36.3* 38.0*  O2SAT 98.9 98.6 97.5 95.3 97.7    Liver Function Tests: No results for input(s): AST, ALT, ALKPHOS, BILITOT, PROT, ALBUMIN in the last 168 hours. No results for input(s): LIPASE, AMYLASE in the last 168 hours. No results for input(s): AMMONIA in the last 168 hours.  CBC: Recent Labs  Lab 10/16/20 0457 10/17/20 0728 10/19/20 0402  WBC 28.0* 24.9* 20.0*  NEUTROABS  --  19.7*  --   HGB 8.3* 8.5* 7.9*  HCT 25.4* 26.8* 24.8*  MCV 92.7 92.7 95.4  PLT 727* 855* 768*    Cardiac Enzymes: No results for input(s): CKTOTAL, CKMB, CKMBINDEX, TROPONINI in the last 168 hours.  BNP (last 3 results) No results for input(s): BNP in the last 8760 hours.  ProBNP (last 3 results) No results for input(s): PROBNP in the last 8760 hours.  Radiological Exams: IR GASTROSTOMY TUBE MOD SED  Result Date: 10/19/2020 INDICATION: Vent dependent respiratory failure, dysphagia EXAM: 20 FRENCH PULL-THROUGH GASTROSTOMY Date:  10/19/2020 10/19/2020 2:28 pm Radiologist:  Judie Petit. Ruel Favors, MD Guidance:  Fluoroscopic MEDICATIONS: 1 g vancomycin; Antibiotics were administered within 1 hour of the procedure. Glucagon 0.5 mg IV ANESTHESIA/SEDATION: Moderate Sedation  Time:  None. The patient was continuously monitored during the procedure by the interventional radiology nurse under my direct supervision. CONTRAST:  61mL OMNIPAQUE IOHEXOL 300 MG/ML SOLN - administered into the gastric lumen. FLUOROSCOPY TIME:  Fluoroscopy Time: 2 minutes 30 seconds (36 mGy). COMPLICATIONS: None immediate. PROCEDURE: Informed consent was obtained from the patient following explanation of the procedure, risks, benefits and alternatives. The patient understands, agrees and consents for the procedure. All questions were addressed. A time out was performed. Maximal barrier sterile technique utilized including caps, mask, sterile gowns, sterile gloves, large sterile  drape, hand hygiene, and betadine prep. The left upper quadrant was sterilely prepped and draped. An oral gastric catheter was inserted into the stomach under fluoroscopy. The existing nasogastric feeding tube was removed. Air was injected into the stomach for insufflation and visualization under fluoroscopy. The air distended stomach was confirmed beneath the anterior abdominal wall in the frontal and lateral projections. Under sterile conditions and local anesthesia, a 17 gauge trocar needle was utilized to access the stomach percutaneously beneath the left subcostal margin. Needle position was confirmed within the stomach under biplane fluoroscopy. Contrast injection confirmed position also. A single T tack was deployed for gastropexy. Over an Amplatz guide wire, a 9-French sheath was inserted into the stomach. A snare device was utilized to capture the oral gastric catheter. The snare device was pulled retrograde from the stomach up the esophagus and out the oropharynx. The 20-French pull-through gastrostomy was connected to the snare device and pulled antegrade through the oropharynx down the esophagus into the stomach and then through the percutaneous tract external to the patient. The gastrostomy was assembled externally. Contrast injection confirms position in the stomach. Images were obtained for documentation. The patient tolerated procedure well. No immediate complication. IMPRESSION: Fluoroscopic insertion of a 20-French "pull-through" gastrostomy. Electronically Signed   By: Judie Petit.  Shick M.D.   On: 10/19/2020 14:34   DG CHEST PORT 1 VIEW  Result Date: 10/19/2020 CLINICAL DATA:  Respiratory failure EXAM: PORTABLE CHEST 1 VIEW COMPARISON:  Four days ago FINDINGS: The enteric tube tip reaches the distal stomach at least. Right PICC with tip at the distal SVC. Tracheostomy tube in place Diffuse interstitial opacity with fibrotic features by CT. No visible effusion or air leak. Normal heart size.  IMPRESSION: 1. Stable interstitial from fibrotic changes by CT. 2. Unremarkable hardware positioning Electronically Signed   By: Marnee Spring M.D.   On: 10/19/2020 07:22   DG Abd Portable 1V  Result Date: 10/19/2020 CLINICAL DATA:  Evaluate barium in the bowel. Plan for gastrostomy tube placement. EXAM: PORTABLE ABDOMEN - 1 VIEW COMPARISON:  10/15/2020 FINDINGS: The bowel gas pattern is nonobstructive. No dilated loops of small bowel. There is enteric contrast throughout the colon extending from the cecum to the rectum. No residual contrast within the stomach or small bowel. AA enteric tube terminates within the distal stomach or proximal duodenum. No radio-opaque calculi or other significant radiographic abnormality are seen. IMPRESSION: 1. Enteric contrast throughout the colon extending from the cecum to the rectum. No residual contrast within the stomach or small bowel. 2. Nonobstructive bowel gas pattern. Electronically Signed   By: Duanne Guess D.O.   On: 10/19/2020 08:36    Assessment/Plan Active Problems:   Acute on chronic respiratory failure with hypoxia (HCC)   COVID-19 virus infection   Acute infective tracheobronchitis   Pneumonia due to COVID-19 virus   Tracheostomy status (HCC)   1. Acute on chronic respiratory failure hypoxia continues to fail  weaning attempts remains in full support on the ventilator.  Chest x-ray still showing significant interstitial fibrotic changes. 2. COVID-19 virus infection in recovery but patient has severe pulmonary disease 3. Pneumonia due to COVID-19 has been treated residual damage noted 4. Tracheobronchitis has been treated 5. Tracheostomy will remain in place on full support on the ventilator   I have personally seen and evaluated the patient, evaluated laboratory and imaging results, formulated the assessment and plan and placed orders. The Patient requires high complexity decision making with multiple systems involvement.  Rounds were  done with the Respiratory Therapy Director and Staff therapists and discussed with nursing staff also.  Yevonne Pax, MD [facility] Pulmonary Critical Care Medicine Sleep Medicine

## 2020-10-21 ENCOUNTER — Other Ambulatory Visit (HOSPITAL_COMMUNITY): Payer: BC Managed Care – PPO

## 2020-10-21 DIAGNOSIS — U071 COVID-19: Secondary | ICD-10-CM | POA: Diagnosis not present

## 2020-10-21 DIAGNOSIS — J9621 Acute and chronic respiratory failure with hypoxia: Secondary | ICD-10-CM | POA: Diagnosis not present

## 2020-10-21 DIAGNOSIS — Z930 Tracheostomy status: Secondary | ICD-10-CM | POA: Diagnosis not present

## 2020-10-21 DIAGNOSIS — J1282 Pneumonia due to coronavirus disease 2019: Secondary | ICD-10-CM | POA: Diagnosis not present

## 2020-10-21 LAB — BLOOD GAS, ARTERIAL
Acid-Base Excess: 8.9 mmol/L — ABNORMAL HIGH (ref 0.0–2.0)
Bicarbonate: 35.1 mmol/L — ABNORMAL HIGH (ref 20.0–28.0)
FIO2: 35
O2 Saturation: 93.6 %
Patient temperature: 37
pCO2 arterial: 71.6 mmHg (ref 32.0–48.0)
pH, Arterial: 7.311 — ABNORMAL LOW (ref 7.350–7.450)
pO2, Arterial: 74.6 mmHg — ABNORMAL LOW (ref 83.0–108.0)

## 2020-10-21 LAB — BASIC METABOLIC PANEL
Anion gap: 10 (ref 5–15)
BUN: 37 mg/dL — ABNORMAL HIGH (ref 6–20)
CO2: 32 mmol/L (ref 22–32)
Calcium: 8.6 mg/dL — ABNORMAL LOW (ref 8.9–10.3)
Chloride: 95 mmol/L — ABNORMAL LOW (ref 98–111)
Creatinine, Ser: 0.81 mg/dL (ref 0.61–1.24)
GFR, Estimated: >60 mL/min (ref >60)
Glucose, Bld: 238 mg/dL — ABNORMAL HIGH (ref 70–99)
Potassium: 4.3 mmol/L (ref 3.5–5.1)
Sodium: 137 mmol/L (ref 135–145)

## 2020-10-21 LAB — CBC
HCT: 28.2 % — ABNORMAL LOW (ref 39.0–52.0)
Hemoglobin: 9.1 g/dL — ABNORMAL LOW (ref 13.0–17.0)
MCH: 30.3 pg (ref 26.0–34.0)
MCHC: 32.3 g/dL (ref 30.0–36.0)
MCV: 94 fL (ref 80.0–100.0)
Platelets: 791 10*3/uL — ABNORMAL HIGH (ref 150–400)
RBC: 3 MIL/uL — ABNORMAL LOW (ref 4.22–5.81)
RDW: 17.5 % — ABNORMAL HIGH (ref 11.5–15.5)
WBC: 23.9 10*3/uL — ABNORMAL HIGH (ref 4.0–10.5)
nRBC: 1.6 % — ABNORMAL HIGH (ref 0.0–0.2)

## 2020-10-21 NOTE — Progress Notes (Signed)
PROGRESS NOTE  Brief Narrative:  Lawrence Freeman is an 49 y.o. male with medical history significant of coronary disease, hypertension, hyperlipidemia, gout who was admitted to the acute facility after transfer from [facility] with COVID-19 pneumonia and pneumomediastinum.  Apparently symptoms started on 08/09/2020, he tested positive for Covid on 08/15/2020.  He was treated with remdesivir/dexamethasone and placed on high flow nasal cannula with subsequent pneumomediastinum.  On 08/31/2019 when he was transferred to [facility] with initial improvement, weaned from BiPAP to high HFNC and then transferred out of the ICU.  However, 09/05/2020 patient decompensated and was intubated.  Janina Mayo was done on 09/20/2020.  His hospital stay was complicated by ARDS, multiple infections.  He had tracheal aspirate cultures that showed Klebsiella pneumonia and he was started on Azactam by infectious disease.  agonal breathing and rapid response was called.  ABG showed acidosis.  He is on 35% FiO2, 5 of PEEP.  He was on treatment with Azactam.  However, he was having worsening WBC count.  He was started on cefepime but he had rash therefore switched to ciprofloxacin.  He is currently on ciprofloxacin, fluconazole. -At this time he is having agonal hyperventilation.    Assessment & Plan: Acute on chronic respiratory failure with hypoxia, ventilator dependent   COVID-19 virus infection Pneumonia with Klebsiella  Acute infective tracheobronchitis Leukocytosis Dysphagia/protein calorie malnutrition Coronary disease status post stent placement  Acute on chronic respiratory failure with hypoxemia: He remains on the ventilator.   He is currently on 35% FiO2, PEEP of 5. Pulmonary following.  Multifactorial etiology.  Initially started with COVID-19 infection and subsequently had secondary bacterial pneumonia with respiratory cultures at the outside facility that showed Klebsiella apparently sensitive to  cephalosporins. He started having fever, worsening leukocyte count while on Azactam. Also concern for acute tracheobronchitis. Therefore antibiotic changed to cefepime.  However, a few minutes after starting cefepime he started having rash which responded to steroids, Benadryl.  Therefore had to discontinue the Cefepime and switch to Ciprofloxacin.  -He is having agonal breathing.  Will order repeat respiratory cultures.  Also suggest to order chest x-ray.  If the chest x-ray is worsening, then consider adding vancomycin. -Unfortunately we do not have very many options with antibiotics because of his allergies.  Do not think we can challenge him with cephalosporins or Carbapenem's given his severe reaction. -If his respiratory status is not improving consider chest CT.  COVID-19 infection with pneumonia: He received remdesivir, dexamethasone.  Patient apparently was not vaccinated.  Here he has been on hydroxyurea protocol.  Also suspicion for ARDS.  Continue supportive management.  Leukocytosis: Likely secondary to the pneumonia.  As mentioned above respiratory cultures from the outside facility showed Klebsiella.  He is worsening while on the Azactam.  Therefore broadened to cefepime.  However, he could not tolerate therefore switched to ciprofloxacin. Fluconazole added.  Will also send for repeat respiratory cultures. Blood cultures do not show any growth to date. -Regarding antibiotics unfortunately we have limited options given his severe allergic reaction to penicillin and cephalosporins.  Dysphagia/protein calorie malnutrition: Further management per the primary team.  History of coronary artery disease, hypertension, hyperlipidemia: Continue medication and management per the primary team.  Unfortunately due to his complex medical problems he is high risk for worsening and decompensation.  Plan of care discussed with pharmacy, primary team.    Subjective: Remains on the ventilator.  He  is on 35% FiO2, 5 PEEP.  He is lethargic this afternoon  with some agonal breathing.  Objective: Vitals:   10/19/20 1415 10/19/20 1420 10/19/20 1425 10/19/20 1430  BP: (!) 140/95 (!) 143/100 (!) 144/81 (!) 135/91  Pulse: 92 100 74 71  Resp: (!) 24 (!) 21 20 19   SpO2: 100% 100% 100% 98%   No intake or output data in the 24 hours ending 10/21/20 1443 There were no vitals filed for this visit.  Examination: Vitals: Temperature 97.4, pulse 74, blood pressure 121/74, respiratory rate 30, oxygen saturation 96% on 35% FiO2, 5 PEEP Constitutional: Ill-appearing male, has trach, on ventilator, opening eyes but lethargic Head: Atraumatic, normocephalic Eyes: Pupils equal and reactive ENMT: external ears and nose appear normal, Lips appears normal, moist oral mucosa Neck: Has trach in place CVS: S1-S2 Respiratory: Coarse breath sounds Abdomen: soft, positive bowel sounds Musculoskeletal: No edema Neuro:  He is opening eyes but lethargic Psych: Unable to assess at this time Skin: no rashes     Data Reviewed: I have personally reviewed following labs and imaging studies  CBC: Recent Labs  Lab 10/16/20 0457 10/17/20 0728 10/19/20 0402 10/21/20 0440  WBC 28.0* 24.9* 20.0* 23.9*  NEUTROABS  --  19.7*  --   --   HGB 8.3* 8.5* 7.9* 9.1*  HCT 25.4* 26.8* 24.8* 28.2*  MCV 92.7 92.7 95.4 94.0  PLT 727* 855* 768* 791*    Basic Metabolic Panel: Recent Labs  Lab 10/16/20 0457 10/17/20 0728 10/19/20 0402 10/20/20 0452 10/21/20 0440  NA 140 140 140 139 137  K 5.1 4.6 4.7 4.3 4.3  CL 98 97* 96* 95* 95*  CO2 31 33* 34* 35* 32  GLUCOSE 328* 261* 208* 162* 238*  BUN 41* 33* 39* 34* 37*  CREATININE 0.86 0.72 0.94 0.84 0.81  CALCIUM 8.6* 9.0 8.7* 8.4* 8.6*    GFR: CrCl cannot be calculated (Unknown ideal weight.).  Liver Function Tests: No results for input(s): AST, ALT, ALKPHOS, BILITOT, PROT, ALBUMIN in the last 168 hours.  CBG: No results for input(s): GLUCAP in the last 168  hours.   Recent Results (from the past 240 hour(s))  Culture, blood (routine x 2)     Status: None   Collection Time: 10/13/20  4:20 PM   Specimen: BLOOD  Result Value Ref Range Status   Specimen Description BLOOD LEFT HAND  Final   Special Requests   Final    BOTTLES DRAWN AEROBIC ONLY Blood Culture results may not be optimal due to an inadequate volume of blood received in culture bottles   Culture   Final    NO GROWTH 5 DAYS Performed at [facility], [address] [place], [place]    Report Status 10/18/2020 FINAL  Final  Culture, blood (routine x 2)     Status: None   Collection Time: 10/13/20  4:35 PM   Specimen: BLOOD  Result Value Ref Range Status   Specimen Description BLOOD LEFT ARM  Final   Special Requests   Final    BOTTLES DRAWN AEROBIC ONLY Blood Culture results may not be optimal due to an inadequate volume of blood received in culture bottles   Culture   Final    NO GROWTH 5 DAYS Performed at [facility], [address], [place], [place]    Report Status 10/18/2020 FINAL  Final  SARS CORONAVIRUS 2 (TAT 6-24 HRS) Nasopharyngeal Nasopharyngeal Swab     Status: None   Collection Time: 10/16/20  4:00 PM   Specimen: Nasopharyngeal Swab  Result Value Ref Range Status   SARS Coronavirus 2 NEGATIVE NEGATIVE Final    Comment: (NOTE) SARS-CoV-2 target nucleic acids are NOT DETECTED.  The SARS-CoV-2 RNA is generally detectable in upper and lower respiratory specimens during the acute phase of infection. Negative results do not preclude SARS-CoV-2 infection, do not rule out co-infections with other pathogens, and should not be used as the sole basis for treatment or other patient management decisions. Negative results must be combined with clinical observations, patient history, and epidemiological information. The expected result is Negative.  Fact Sheet for Patients: HairSlick.no  Fact Sheet for  Healthcare Providers: quierodirigir.com  This test is not yet approved or cleared by the [place] FDA and  has been authorized for detection and/or diagnosis of SARS-CoV-2 by FDA under an Emergency Use Authorization (EUA). This EUA will remain  in effect (meaning this test can be used) for the duration of the COVID-19 declaration under Se ction 564(b)(1) of the Act, 21 U.S.C. section 360bbb-3(b)(1), unless the authorization is terminated or revoked sooner.  Performed at [facility], [address], [place], [place] [address]          Radiology Studies: No results found.  Scheduled Meds: Please see MAR   Vonzella Nipple, MD   10/21/2020, 2:43 PM

## 2020-10-21 NOTE — Progress Notes (Signed)
Pulmonary Critical Care Medicine [facility] [place]   PULMONARY CRITICAL CARE SERVICE  PROGRESS NOTE  Date of Service: 10/21/2020  Lawrence Freeman  DJM:[id number]  DOB: Aug 09, 1971   DOA: 10/11/2020  Referring Physician: Carron Curie, MD  HPI: Lawrence Freeman is a 49 y.o. male seen for follow up of Acute on Chronic Respiratory Failure. Patient remains on the ventilator and full support right now has been on pressure assist control mode currently on 35% FiO2  Medications: Reviewed on Rounds  Physical Exam:  Vitals: Temperature is 96.4 pulse 73 respiratory rate 30 blood pressure is 121/74 saturations 96%  Ventilator Settings on pressure assist control FiO2 35% tidal volumes 4 7 with a PEEP of 5  . General: Comfortable at this time . Eyes: Grossly normal lids, irises & conjunctiva . ENT: grossly tongue is normal . Neck: no obvious mass . Cardiovascular: S1 S2 normal no gallop . Respiratory: No rhonchi very coarse breath sounds . Abdomen: soft . Skin: no rash seen on limited exam . Musculoskeletal: not rigid . Psychiatric:unable to assess . Neurologic: no seizure no involuntary movements         Lab Data:   Basic Metabolic Panel: Recent Labs  Lab 10/16/20 0457 10/17/20 0728 10/19/20 0402 10/20/20 0452 10/21/20 0440  NA 140 140 140 139 137  K 5.1 4.6 4.7 4.3 4.3  CL 98 97* 96* 95* 95*  CO2 31 33* 34* 35* 32  GLUCOSE 328* 261* 208* 162* 238*  BUN 41* 33* 39* 34* 37*  CREATININE 0.86 0.72 0.94 0.84 0.81  CALCIUM 8.6* 9.0 8.7* 8.4* 8.6*    ABG: Recent Labs  Lab 10/15/20 1118 10/15/20 1750 10/16/20 0535 10/16/20 1848 10/17/20 1315  PHART 7.302* 7.321* 7.299* 7.334* 7.369  PCO2ART 75.0* 70.5* 73.4* 70.2* 67.5*  PO2ART 125* 108 92.3 76.2* 92.7  HCO3 35.9* 35.8* 35.5* 36.3* 38.0*  O2SAT 98.9 98.6 97.5 95.3 97.7    Liver Function Tests: No results for input(s): AST, ALT, ALKPHOS, BILITOT, PROT, ALBUMIN in the last 168 hours. No  results for input(s): LIPASE, AMYLASE in the last 168 hours. No results for input(s): AMMONIA in the last 168 hours.  CBC: Recent Labs  Lab 10/16/20 0457 10/17/20 0728 10/19/20 0402 10/21/20 0440  WBC 28.0* 24.9* 20.0* 23.9*  NEUTROABS  --  19.7*  --   --   HGB 8.3* 8.5* 7.9* 9.1*  HCT 25.4* 26.8* 24.8* 28.2*  MCV 92.7 92.7 95.4 94.0  PLT 727* 855* 768* 791*    Cardiac Enzymes: No results for input(s): CKTOTAL, CKMB, CKMBINDEX, TROPONINI in the last 168 hours.  BNP (last 3 results) No results for input(s): BNP in the last 8760 hours.  ProBNP (last 3 results) No results for input(s): PROBNP in the last 8760 hours.  Radiological Exams: IR GASTROSTOMY TUBE MOD SED  Result Date: 10/19/2020 INDICATION: Vent dependent respiratory failure, dysphagia EXAM: 20 FRENCH PULL-THROUGH GASTROSTOMY Date:  10/19/2020 10/19/2020 2:28 pm Radiologist:  Judie Petit. Ruel Favors, MD Guidance:  Fluoroscopic MEDICATIONS: 1 g vancomycin; Antibiotics were administered within 1 hour of the procedure. Glucagon 0.5 mg IV ANESTHESIA/SEDATION: Moderate Sedation Time:  None. The patient was continuously monitored during the procedure by the interventional radiology nurse under my direct supervision. CONTRAST:  55mL OMNIPAQUE IOHEXOL 300 MG/ML SOLN - administered into the gastric lumen. FLUOROSCOPY TIME:  Fluoroscopy Time: 2 minutes 30 seconds (36 mGy). COMPLICATIONS: None immediate. PROCEDURE: Informed consent was obtained from the patient following explanation of the procedure, risks, benefits and alternatives. The  patient understands, agrees and consents for the procedure. All questions were addressed. A time out was performed. Maximal barrier sterile technique utilized including caps, mask, sterile gowns, sterile gloves, large sterile drape, hand hygiene, and betadine prep. The left upper quadrant was sterilely prepped and draped. An oral gastric catheter was inserted into the stomach under fluoroscopy. The existing  nasogastric feeding tube was removed. Air was injected into the stomach for insufflation and visualization under fluoroscopy. The air distended stomach was confirmed beneath the anterior abdominal wall in the frontal and lateral projections. Under sterile conditions and local anesthesia, a 17 gauge trocar needle was utilized to access the stomach percutaneously beneath the left subcostal margin. Needle position was confirmed within the stomach under biplane fluoroscopy. Contrast injection confirmed position also. A single T tack was deployed for gastropexy. Over an Amplatz guide wire, a 9-French sheath was inserted into the stomach. A snare device was utilized to capture the oral gastric catheter. The snare device was pulled retrograde from the stomach up the esophagus and out the oropharynx. The 20-French pull-through gastrostomy was connected to the snare device and pulled antegrade through the oropharynx down the esophagus into the stomach and then through the percutaneous tract external to the patient. The gastrostomy was assembled externally. Contrast injection confirms position in the stomach. Images were obtained for documentation. The patient tolerated procedure well. No immediate complication. IMPRESSION: Fluoroscopic insertion of a 20-French "pull-through" gastrostomy. Electronically Signed   By: Judie Petit.  Shick M.D.   On: 10/19/2020 14:34    Assessment/Plan Active Problems:   Acute on chronic respiratory failure with hypoxia (HCC)   COVID-19 virus infection   Acute infective tracheobronchitis   Pneumonia due to COVID-19 virus   Tracheostomy status (HCC)   1. Acute on chronic respiratory failure with hypoxia we will continue with weaning attempts so far patient has not been tolerating. 2. COVID-19 virus infection in recovery we will continue with supportive care 3. Acute infective tracheobronchitis at baseline we will continue to follow along. 4. Pneumonia due to COVID-19 is slow  improvement 5. Tracheostomy will remain in place   I have personally seen and evaluated the patient, evaluated laboratory and imaging results, formulated the assessment and plan and placed orders. The Patient requires high complexity decision making with multiple systems involvement.  Rounds were done with the Respiratory Therapy Director and Staff therapists and discussed with nursing staff also.  Yevonne Pax, MD [facility] Pulmonary Critical Care Medicine Sleep Medicine

## 2020-10-22 DIAGNOSIS — Z930 Tracheostomy status: Secondary | ICD-10-CM | POA: Diagnosis not present

## 2020-10-22 DIAGNOSIS — U071 COVID-19: Secondary | ICD-10-CM | POA: Diagnosis not present

## 2020-10-22 DIAGNOSIS — J9621 Acute and chronic respiratory failure with hypoxia: Secondary | ICD-10-CM | POA: Diagnosis not present

## 2020-10-22 DIAGNOSIS — J1282 Pneumonia due to coronavirus disease 2019: Secondary | ICD-10-CM | POA: Diagnosis not present

## 2020-10-22 NOTE — Progress Notes (Signed)
Pulmonary Critical Care Medicine [facility] [place]   PULMONARY CRITICAL CARE SERVICE  PROGRESS NOTE  Date of Service: 10/22/2020  Lawrence Freeman  DVV:[id number]  DOB: [birth year]   DOA: 10/11/2020  Referring Physician: Carron Curie, MD  HPI: Lawrence Freeman is a 49 y.o. male seen for follow up of Acute on Chronic Respiratory Failure.  Patient currently is on full support on pressure control mode has been on 35% FiO2  Medications: Reviewed on Rounds  Physical Exam:  Vitals: Temperature is 96.8 pulse 92 respiratory 35 blood pressure is 171/87 saturations 95%  Ventilator Settings on pressure assist control FiO2 is 35% tidal volume 410 PEEP 5 IP 22  . General: Comfortable at this time . Eyes: Grossly normal lids, irises & conjunctiva . ENT: grossly tongue is normal . Neck: no obvious mass . Cardiovascular: S1 S2 normal no gallop . Respiratory: Scattered rhonchi expansion is equal at this time. . Abdomen: soft . Skin: no rash seen on limited exam . Musculoskeletal: not rigid . Psychiatric:unable to assess . Neurologic: no seizure no involuntary movements         Lab Data:   Basic Metabolic Panel: Recent Labs  Lab 10/16/20 0457 10/17/20 0728 10/19/20 0402 10/20/20 0452 10/21/20 0440  NA 140 140 140 139 137  K 5.1 4.6 4.7 4.3 4.3  CL 98 97* 96* 95* 95*  CO2 31 33* 34* 35* 32  GLUCOSE 328* 261* 208* 162* 238*  BUN 41* 33* 39* 34* 37*  CREATININE 0.86 0.72 0.94 0.84 0.81  CALCIUM 8.6* 9.0 8.7* 8.4* 8.6*    ABG: Recent Labs  Lab 10/15/20 1750 10/16/20 0535 10/16/20 1848 10/17/20 1315 10/21/20 1521  PHART 7.321* 7.299* 7.334* 7.369 7.311*  PCO2ART 70.5* 73.4* 70.2* 67.5* 71.6*  PO2ART 108 92.3 76.2* 92.7 74.6*  HCO3 35.8* 35.5* 36.3* 38.0* 35.1*  O2SAT 98.6 97.5 95.3 97.7 93.6    Liver Function Tests: No results for input(s): AST, ALT, ALKPHOS, BILITOT, PROT, ALBUMIN in the last 168 hours. No results for input(s): LIPASE, AMYLASE  in the last 168 hours. No results for input(s): AMMONIA in the last 168 hours.  CBC: Recent Labs  Lab 10/16/20 0457 10/17/20 0728 10/19/20 0402 10/21/20 0440  WBC 28.0* 24.9* 20.0* 23.9*  NEUTROABS  --  19.7*  --   --   HGB 8.3* 8.5* 7.9* 9.1*  HCT 25.4* 26.8* 24.8* 28.2*  MCV 92.7 92.7 95.4 94.0  PLT 727* 855* 768* 791*    Cardiac Enzymes: No results for input(s): CKTOTAL, CKMB, CKMBINDEX, TROPONINI in the last 168 hours.  BNP (last 3 results) No results for input(s): BNP in the last 8760 hours.  ProBNP (last 3 results) No results for input(s): PROBNP in the last 8760 hours.  Radiological Exams: DG CHEST PORT 1 VIEW  Result Date: 10/21/2020 CLINICAL DATA:  Acute respiratory disease EXAM: PORTABLE CHEST 1 VIEW COMPARISON:  Radiograph 10/19/2020, CT 10/15/2020 FINDINGS: Tracheostomy tube tip terminates 4.9 cm from the carina. A right upper extremity PICC tip terminates in the mid to lower SVC. Telemetry leads and external support devices overlie the patient. Residual bilateral heterogeneous airspace opacities throughout both lungs. Some lucency along the left heart border may reflect residual pneumomediastinum with more convincing lucencies towards the thoracic inlet as well. Diminished subcutaneous emphysema from comparison studies. No convincing pneumothorax or pleural fluid. No new osseous or soft tissue abnormality. IMPRESSION: 1. Residual bilateral heterogeneous airspace opacities throughout both lungs. 2. Residual pneumomediastinum with lucency towards the thoracic inlet and  along the left heart border. 3. Diminished subcutaneous emphysema. 4. Tracheostomy tube tip terminates 4.9 cm from the carina. 5. Right upper extremity PICC tip in the mid to lower SVC Electronically Signed   By: Kreg Shropshire M.D.   On: 10/21/2020 15:46    Assessment/Plan Active Problems:   Acute on chronic respiratory failure with hypoxia (HCC)   COVID-19 virus infection   Acute infective  tracheobronchitis   Pneumonia due to COVID-19 virus   Tracheostomy status (HCC)   1. Acute on chronic respiratory failure with hypoxia patient continues to wax and wane as far as his ventilatory status is concerned.  He continues to remain on full support not tolerating weaning still. 2. COVID-19 virus infection patient has been seen by infectious disease and will continue with their recommendations. 3. Tracheobronchitis has been treated with antibiotics patient also had apparently grown Klebsiella. 4. Pneumonia due to COVID-19 treated 5. Tracheostomy remains in place overall prognosis guarded.   I have personally seen and evaluated the patient, evaluated laboratory and imaging results, formulated the assessment and plan and placed orders. The Patient requires high complexity decision making with multiple systems involvement.  Rounds were done with the Respiratory Therapy Director and Staff therapists and discussed with nursing staff also.  Yevonne Pax, MD [facility] Pulmonary Critical Care Medicine Sleep Medicine

## 2020-10-23 ENCOUNTER — Other Ambulatory Visit (HOSPITAL_COMMUNITY): Payer: BC Managed Care – PPO

## 2020-10-23 DIAGNOSIS — U071 COVID-19: Secondary | ICD-10-CM | POA: Diagnosis not present

## 2020-10-23 DIAGNOSIS — Z930 Tracheostomy status: Secondary | ICD-10-CM | POA: Diagnosis not present

## 2020-10-23 DIAGNOSIS — J209 Acute bronchitis, unspecified: Secondary | ICD-10-CM | POA: Diagnosis not present

## 2020-10-23 DIAGNOSIS — J9621 Acute and chronic respiratory failure with hypoxia: Secondary | ICD-10-CM | POA: Diagnosis not present

## 2020-10-23 LAB — CBC
HCT: 26.7 % — ABNORMAL LOW (ref 39.0–52.0)
Hemoglobin: 8.5 g/dL — ABNORMAL LOW (ref 13.0–17.0)
MCH: 31.1 pg (ref 26.0–34.0)
MCHC: 31.8 g/dL (ref 30.0–36.0)
MCV: 97.8 fL (ref 80.0–100.0)
Platelets: 476 10*3/uL — ABNORMAL HIGH (ref 150–400)
RBC: 2.73 MIL/uL — ABNORMAL LOW (ref 4.22–5.81)
RDW: 19.7 % — ABNORMAL HIGH (ref 11.5–15.5)
WBC: 46.8 10*3/uL — ABNORMAL HIGH (ref 4.0–10.5)
nRBC: 0.3 % — ABNORMAL HIGH (ref 0.0–0.2)

## 2020-10-23 LAB — BLOOD GAS, ARTERIAL
Acid-Base Excess: 13.7 mmol/L — ABNORMAL HIGH (ref 0.0–2.0)
Bicarbonate: 40.9 mmol/L — ABNORMAL HIGH (ref 20.0–28.0)
FIO2: 50
O2 Saturation: 94.9 %
Patient temperature: 37.6
pCO2 arterial: 94.8 mmHg (ref 32.0–48.0)
pH, Arterial: 7.261 — ABNORMAL LOW (ref 7.350–7.450)
pO2, Arterial: 86.1 mmHg (ref 83.0–108.0)

## 2020-10-23 LAB — COMPREHENSIVE METABOLIC PANEL
ALT: 32 U/L (ref 0–44)
AST: 34 U/L (ref 15–41)
Albumin: 2.5 g/dL — ABNORMAL LOW (ref 3.5–5.0)
Alkaline Phosphatase: 65 U/L (ref 38–126)
Anion gap: 9 (ref 5–15)
BUN: 36 mg/dL — ABNORMAL HIGH (ref 6–20)
CO2: 39 mmol/L — ABNORMAL HIGH (ref 22–32)
Calcium: 8.4 mg/dL — ABNORMAL LOW (ref 8.9–10.3)
Chloride: 93 mmol/L — ABNORMAL LOW (ref 98–111)
Creatinine, Ser: 0.81 mg/dL (ref 0.61–1.24)
GFR, Estimated: >60 mL/min (ref >60)
Glucose, Bld: 86 mg/dL (ref 70–99)
Potassium: 4.1 mmol/L (ref 3.5–5.1)
Sodium: 141 mmol/L (ref 135–145)
Total Bilirubin: 0.8 mg/dL (ref 0.3–1.2)
Total Protein: 4.7 g/dL — ABNORMAL LOW (ref 6.5–8.1)

## 2020-10-23 LAB — VANCOMYCIN, TROUGH: Vancomycin Tr: 14 ug/mL — ABNORMAL LOW (ref 15–20)

## 2020-10-23 MED ORDER — IOHEXOL 350 MG/ML SOLN
100.0000 mL | Freq: Once | INTRAVENOUS | Status: AC | PRN
  Administered 2020-10-23: 100 mL via INTRAVENOUS

## 2020-10-23 NOTE — Consult Note (Signed)
.   Ref: Gunnar Bulla, MD   Subjective:  Awake. Some agitation and significant respiratory distress on 35 % FiO2.  Objective:  Vital Signs in the last 24 hours:  P:78-100 BP: 138/80 R: 30-40/min O2 sat: 90 %. On Fluconazole and Vancomycin  Physical Exam: BP Readings from Last 1 Encounters:  10/19/20 (!) 135/91     Wt Readings from Last 1 Encounters:  No data found for Wt    Weight change:  There is no height or weight on file to calculate BMI. HEENT: [place]/AT, Eyes-Blue, Conjunctiva-Pale, Sclera-Non-icteric Neck: No JVD, No bruit, Trachea midline. Lungs:  Clear, Bilateral. Cardiac:  Regular rhythm, normal S1 and S2, no S3. II/VI systolic murmur. Abdomen:  Soft, non-tender. BS present. Extremities:  No edema present. No cyanosis. No clubbing. CNS: AxOx0, Cranial nerves grossly intact, moves all 4 extremities.  Skin: Warm and dry.   Intake/Output from previous day: No intake/output data recorded.    Lab Results: BMET    Component Value Date/Time   NA 137 10/21/2020 0440   NA 139 10/20/2020 0452   NA 140 10/19/2020 0402   K 4.3 10/21/2020 0440   K 4.3 10/20/2020 0452   K 4.7 10/19/2020 0402   CL 95 (L) 10/21/2020 0440   CL 95 (L) 10/20/2020 0452   CL 96 (L) 10/19/2020 0402   CO2 32 10/21/2020 0440   CO2 35 (H) 10/20/2020 0452   CO2 34 (H) 10/19/2020 0402   GLUCOSE 238 (H) 10/21/2020 0440   GLUCOSE 162 (H) 10/20/2020 0452   GLUCOSE 208 (H) 10/19/2020 0402   BUN 37 (H) 10/21/2020 0440   BUN 34 (H) 10/20/2020 0452   BUN 39 (H) 10/19/2020 0402   CREATININE 0.81 10/21/2020 0440   CREATININE 0.84 10/20/2020 0452   CREATININE 0.94 10/19/2020 0402   CALCIUM 8.6 (L) 10/21/2020 0440   CALCIUM 8.4 (L) 10/20/2020 0452   CALCIUM 8.7 (L) 10/19/2020 0402   GFRNONAA >60 10/21/2020 0440   GFRNONAA >60 10/20/2020 0452   GFRNONAA >60 10/19/2020 0402   CBC    Component Value Date/Time   WBC 23.9 (H) 10/21/2020 0440   RBC 3.00 (L) 10/21/2020 0440   HGB 9.1 (L)  10/21/2020 0440   HCT 28.2 (L) 10/21/2020 0440   PLT 791 (H) 10/21/2020 0440   MCV 94.0 10/21/2020 0440   MCH 30.3 10/21/2020 0440   MCHC 32.3 10/21/2020 0440   RDW 17.5 (H) 10/21/2020 0440   LYMPHSABS 2.7 10/17/2020 0728   MONOABS 0.6 10/17/2020 0728   EOSABS 0.0 10/17/2020 0728   BASOSABS 0.1 10/17/2020 0728   HEPATIC Function Panel Recent Labs    10/12/20 0554 10/13/20 0928  PROT 6.4* 6.7   HEMOGLOBIN A1C No components found for: HGA1C,  MPG CARDIAC ENZYMES No results found for: CKTOTAL, CKMB, CKMBINDEX, TROPONINI BNP No results for input(s): PROBNP in the last 8760 hours. TSH No results for input(s): TSH in the last 8760 hours. CHOLESTEROL No results for input(s): CHOL in the last 8760 hours.  Scheduled Meds: Continuous Infusions: PRN Meds:.  Assessment/Plan: Sinus tachycardia Anxiety Acute on chronic respiratory failure with hypoxia COVID-19 infection Klebsiella pneumonia Sepsis CAD S/P stent placement  Continue B-betablocker as tolerated.   LOS: 0 days   Time spent including chart review, lab review, examination, discussion with patient :  min   Orpah Cobb  MD  10/23/2020, 12:40 PM

## 2020-10-23 NOTE — Progress Notes (Addendum)
Pulmonary Critical Care Medicine [facility] [place]   PULMONARY CRITICAL CARE SERVICE  PROGRESS NOTE  Date of Service: 10/23/2020  Cleon Signorelli  IWP:[id number]  DOB: October 16, 1971   DOA: 10/11/2020  Referring Physician: Carron Curie, MD  HPI: Tieler Cournoyer is a 49 y.o. male seen for follow up of Acute on Chronic Respiratory Failure. Patient currently is on normal pressure control mode has been on 35% FiO2 was attempted on pressure support but did not tolerate.  Medications: Reviewed on Rounds  Physical Exam:  Vitals: Temperature is 97.0 pulse 67 respiratory 20 blood pressure is 113/68 saturations 95%  Ventilator Settings on pressure assist control FiO2 is 35% tidal volume 409 IP 22 PEEP5  . General: Comfortable at this time . Eyes: Grossly normal lids, irises & conjunctiva . ENT: grossly tongue is normal . Neck: no obvious mass . Cardiovascular: S1 S2 normal no gallop . Respiratory: No rhonchi very coarse breath sounds . Abdomen: soft . Skin: no rash seen on limited exam . Musculoskeletal: not rigid . Psychiatric:unable to assess . Neurologic: no seizure no involuntary movements         Lab Data:   Basic Metabolic Panel: Recent Labs  Lab 10/17/20 0728 10/19/20 0402 10/20/20 0452 10/21/20 0440  NA 140 140 139 137  K 4.6 4.7 4.3 4.3  CL 97* 96* 95* 95*  CO2 33* 34* 35* 32  GLUCOSE 261* 208* 162* 238*  BUN 33* 39* 34* 37*  CREATININE 0.72 0.94 0.84 0.81  CALCIUM 9.0 8.7* 8.4* 8.6*    ABG: Recent Labs  Lab 10/16/20 1848 10/17/20 1315 10/21/20 1521  PHART 7.334* 7.369 7.311*  PCO2ART 70.2* 67.5* 71.6*  PO2ART 76.2* 92.7 74.6*  HCO3 36.3* 38.0* 35.1*  O2SAT 95.3 97.7 93.6    Liver Function Tests: No results for input(s): AST, ALT, ALKPHOS, BILITOT, PROT, ALBUMIN in the last 168 hours. No results for input(s): LIPASE, AMYLASE in the last 168 hours. No results for input(s): AMMONIA in the last 168 hours.  CBC: Recent Labs   Lab 10/17/20 0728 10/19/20 0402 10/21/20 0440  WBC 24.9* 20.0* 23.9*  NEUTROABS 19.7*  --   --   HGB 8.5* 7.9* 9.1*  HCT 26.8* 24.8* 28.2*  MCV 92.7 95.4 94.0  PLT 855* 768* 791*    Cardiac Enzymes: No results for input(s): CKTOTAL, CKMB, CKMBINDEX, TROPONINI in the last 168 hours.  BNP (last 3 results) No results for input(s): BNP in the last 8760 hours.  ProBNP (last 3 results) No results for input(s): PROBNP in the last 8760 hours.  Radiological Exams: DG CHEST PORT 1 VIEW  Result Date: 10/21/2020 CLINICAL DATA:  Acute respiratory disease EXAM: PORTABLE CHEST 1 VIEW COMPARISON:  Radiograph 10/19/2020, CT 10/15/2020 FINDINGS: Tracheostomy tube tip terminates 4.9 cm from the carina. A right upper extremity PICC tip terminates in the mid to lower SVC. Telemetry leads and external support devices overlie the patient. Residual bilateral heterogeneous airspace opacities throughout both lungs. Some lucency along the left heart border may reflect residual pneumomediastinum with more convincing lucencies towards the thoracic inlet as well. Diminished subcutaneous emphysema from comparison studies. No convincing pneumothorax or pleural fluid. No new osseous or soft tissue abnormality. IMPRESSION: 1. Residual bilateral heterogeneous airspace opacities throughout both lungs. 2. Residual pneumomediastinum with lucency towards the thoracic inlet and along the left heart border. 3. Diminished subcutaneous emphysema. 4. Tracheostomy tube tip terminates 4.9 cm from the carina. 5. Right upper extremity PICC tip in the mid to lower SVC  Electronically Signed   By: Kreg Shropshire M.D.   On: 10/21/2020 15:46    Assessment/Plan Active Problems:   Acute on chronic respiratory failure with hypoxia (HCC)   COVID-19 virus infection   Acute infective tracheobronchitis   Pneumonia due to COVID-19 virus   Tracheostomy status (HCC)   1. Acute on chronic respiratory failure hypoxia we will continue with full  support on the ventilator. Patient was attempted on pressure support but did not tolerate. 2. COVID-19 virus infection in recovery 3. Tracheobronchitis treated patient has severe bronchiectasis noted on the chest CT. He is on no Cipro has multiple issues with allergies. Sputum culture will be ordered consideration for atypical infection. 4. Pneumonia due to COVID-19 treated we will continue to follow along 5. Tracheostomy remains in place   I have personally seen and evaluated the patient, evaluated laboratory and imaging results, formulated the assessment and plan and placed orders. The Patient requires high complexity decision making with multiple systems involvement.  Rounds were done with the Respiratory Therapy Director and Staff therapists and discussed with nursing staff also.  Yevonne Pax, MD [facility] Pulmonary Critical Care Medicine Sleep Medicine

## 2020-10-24 ENCOUNTER — Other Ambulatory Visit (HOSPITAL_COMMUNITY): Payer: BC Managed Care – PPO

## 2020-10-24 DIAGNOSIS — U071 COVID-19: Secondary | ICD-10-CM | POA: Diagnosis not present

## 2020-10-24 DIAGNOSIS — Z930 Tracheostomy status: Secondary | ICD-10-CM | POA: Diagnosis not present

## 2020-10-24 DIAGNOSIS — J9621 Acute and chronic respiratory failure with hypoxia: Secondary | ICD-10-CM | POA: Diagnosis not present

## 2020-10-24 DIAGNOSIS — J1282 Pneumonia due to coronavirus disease 2019: Secondary | ICD-10-CM | POA: Diagnosis not present

## 2020-10-24 LAB — BLOOD GAS, ARTERIAL
Acid-Base Excess: 14.7 mmol/L — ABNORMAL HIGH (ref 0.0–2.0)
Bicarbonate: 39.7 mmol/L — ABNORMAL HIGH (ref 20.0–28.0)
FIO2: 50
O2 Saturation: 95 %
Patient temperature: 35.6
pCO2 arterial: 53.4 mmHg — ABNORMAL HIGH (ref 32.0–48.0)
pH, Arterial: 7.476 — ABNORMAL HIGH (ref 7.350–7.450)
pO2, Arterial: 64.6 mmHg — ABNORMAL LOW (ref 83.0–108.0)

## 2020-10-24 LAB — URINALYSIS, ROUTINE W REFLEX MICROSCOPIC
Bilirubin Urine: NEGATIVE
Glucose, UA: 50 mg/dL — AB
Hgb urine dipstick: NEGATIVE
Ketones, ur: NEGATIVE mg/dL
Leukocytes,Ua: NEGATIVE
Nitrite: NEGATIVE
Protein, ur: NEGATIVE mg/dL
Specific Gravity, Urine: 1.025 (ref 1.005–1.030)
pH: 5 (ref 5.0–8.0)

## 2020-10-24 LAB — CBC
HCT: 25.2 % — ABNORMAL LOW (ref 39.0–52.0)
Hemoglobin: 8.3 g/dL — ABNORMAL LOW (ref 13.0–17.0)
MCH: 31.4 pg (ref 26.0–34.0)
MCHC: 32.9 g/dL (ref 30.0–36.0)
MCV: 95.5 fL (ref 80.0–100.0)
Platelets: 403 10*3/uL — ABNORMAL HIGH (ref 150–400)
RBC: 2.64 MIL/uL — ABNORMAL LOW (ref 4.22–5.81)
RDW: 19.6 % — ABNORMAL HIGH (ref 11.5–15.5)
WBC: 36.5 10*3/uL — ABNORMAL HIGH (ref 4.0–10.5)
nRBC: 0.1 % (ref 0.0–0.2)

## 2020-10-24 LAB — BASIC METABOLIC PANEL
Anion gap: 10 (ref 5–15)
BUN: 26 mg/dL — ABNORMAL HIGH (ref 6–20)
CO2: 36 mmol/L — ABNORMAL HIGH (ref 22–32)
Calcium: 8.4 mg/dL — ABNORMAL LOW (ref 8.9–10.3)
Chloride: 94 mmol/L — ABNORMAL LOW (ref 98–111)
Creatinine, Ser: 0.62 mg/dL (ref 0.61–1.24)
GFR, Estimated: >60 mL/min (ref >60)
Glucose, Bld: 100 mg/dL — ABNORMAL HIGH (ref 70–99)
Potassium: 3.7 mmol/L (ref 3.5–5.1)
Sodium: 140 mmol/L (ref 135–145)

## 2020-10-24 NOTE — Consult Note (Signed)
Ref: Gunnar Bulla, MD   Subjective:  Resting comfortably with sedation of Midazolam and Fentanyl drips.  Objective:  Vital Signs in the last 24 hours:  P: 88, sinus rhythm R:37 BP: 116/74 O2 sat 92 % on 45 % FiO2  Physical Exam: BP Readings from Last 1 Encounters:  10/19/20 (!) 135/91     Wt Readings from Last 1 Encounters:  No data found for Wt    Weight change:  There is no height or weight on file to calculate BMI. HEENT: [place]/AT, Eyes-Blue, Conjunctiva-Pale, Sclera-Non-icteric Neck: No JVD, No bruit, Trachea midline. Lungs:  Clearing, Bilateral. Cardiac:  Regular rhythm, normal S1 and S2, no S3. II/VI systolic murmur. Abdomen:  Soft, non-tender. BS present. Extremities:  No edema present. No cyanosis. No clubbing. CNS: AxOx0, Cranial nerves grossly intact. Skin: Warm and dry.   Intake/Output from previous day: No intake/output data recorded.    Lab Results: BMET    Component Value Date/Time   NA 140 10/24/2020 0553   NA 141 10/23/2020 1823   NA 137 10/21/2020 0440   K 3.7 10/24/2020 0553   K 4.1 10/23/2020 1823   K 4.3 10/21/2020 0440   CL 94 (L) 10/24/2020 0553   CL 93 (L) 10/23/2020 1823   CL 95 (L) 10/21/2020 0440   CO2 36 (H) 10/24/2020 0553   CO2 39 (H) 10/23/2020 1823   CO2 32 10/21/2020 0440   GLUCOSE 100 (H) 10/24/2020 0553   GLUCOSE 86 10/23/2020 1823   GLUCOSE 238 (H) 10/21/2020 0440   BUN 26 (H) 10/24/2020 0553   BUN 36 (H) 10/23/2020 1823   BUN 37 (H) 10/21/2020 0440   CREATININE 0.62 10/24/2020 0553   CREATININE 0.81 10/23/2020 1823   CREATININE 0.81 10/21/2020 0440   CALCIUM 8.4 (L) 10/24/2020 0553   CALCIUM 8.4 (L) 10/23/2020 1823   CALCIUM 8.6 (L) 10/21/2020 0440   GFRNONAA >60 10/24/2020 0553   GFRNONAA >60 10/23/2020 1823   GFRNONAA >60 10/21/2020 0440   CBC    Component Value Date/Time   WBC 36.5 (H) 10/24/2020 0553   RBC 2.64 (L) 10/24/2020 0553   HGB 8.3 (L) 10/24/2020 0553   HCT 25.2 (L) 10/24/2020 0553   PLT 403 (H)  10/24/2020 0553   MCV 95.5 10/24/2020 0553   MCH 31.4 10/24/2020 0553   MCHC 32.9 10/24/2020 0553   RDW 19.6 (H) 10/24/2020 0553   LYMPHSABS 2.7 10/17/2020 0728   MONOABS 0.6 10/17/2020 0728   EOSABS 0.0 10/17/2020 0728   BASOSABS 0.1 10/17/2020 0728   HEPATIC Function Panel Recent Labs    10/12/20 0554 10/13/20 0928 10/23/20 1823  PROT 6.4* 6.7 4.7*   HEMOGLOBIN A1C No components found for: HGA1C,  MPG CARDIAC ENZYMES No results found for: CKTOTAL, CKMB, CKMBINDEX, TROPONINI BNP No results for input(s): PROBNP in the last 8760 hours. TSH No results for input(s): TSH in the last 8760 hours. CHOLESTEROL No results for input(s): CHOL in the last 8760 hours.  Scheduled Meds: Continuous Infusions: PRN Meds:.  Assessment/Plan: Acute on chronic respiratory failure with hypoxia COVID-19 pneumonia ARDS Klebsiella pneumonia CAD S/P stent placement  Continue current treatment.   LOS: 0 days   Time spent including chart review, lab review, examination, discussion with patient/Nurse : 25 min   Orpah Cobb  MD  10/24/2020, 7:55 PM

## 2020-10-24 NOTE — Progress Notes (Signed)
Pulmonary Critical Care Medicine [facility] [place]   PULMONARY CRITICAL CARE SERVICE  PROGRESS NOTE  Date of Service: 10/24/2020  Norlan Rann  MCN:[id number]  DOB: [birth year]   DOA: 10/11/2020  Referring Physician: Carron Curie, MD  HPI: Jarian Longoria is a 49 y.o. male seen for follow up of Acute on Chronic Respiratory Failure.  Patient remains on the ventilator and full support currently is on pressure assist control mode has been on 45% FiO2.  Patient was attempted on pressure support this morning but failed  Medications: Reviewed on Rounds  Physical Exam:  Vitals: Temperature is 96.5 pulse 101 respiratory rate is 40 blood pressure is 135/90 saturations 98%  Ventilator Settings on pressure assist control FiO2 is 45% IP is 22 PEEP of 5  . General: Comfortable at this time . Eyes: Grossly normal lids, irises & conjunctiva . ENT: grossly tongue is normal . Neck: no obvious mass . Cardiovascular: S1 S2 normal no gallop . Respiratory: Scattered rhonchi noted bilaterally . Abdomen: soft . Skin: no rash seen on limited exam . Musculoskeletal: not rigid . Psychiatric:unable to assess . Neurologic: no seizure no involuntary movements         Lab Data:   Basic Metabolic Panel: Recent Labs  Lab 10/19/20 0402 10/20/20 0452 10/21/20 0440 10/23/20 1823 10/24/20 0553  NA 140 139 137 141 140  K 4.7 4.3 4.3 4.1 3.7  CL 96* 95* 95* 93* 94*  CO2 34* 35* 32 39* 36*  GLUCOSE 208* 162* 238* 86 100*  BUN 39* 34* 37* 36* 26*  CREATININE 0.94 0.84 0.81 0.81 0.62  CALCIUM 8.7* 8.4* 8.6* 8.4* 8.4*    ABG: Recent Labs  Lab 10/17/20 1315 10/21/20 1521 10/23/20 1655 10/24/20 0540  PHART 7.369 7.311* 7.261* 7.476*  PCO2ART 67.5* 71.6* 94.8* 53.4*  PO2ART 92.7 74.6* 86.1 64.6*  HCO3 38.0* 35.1* 40.9* 39.7*  O2SAT 97.7 93.6 94.9 95.0    Liver Function Tests: Recent Labs  Lab 10/23/20 1823  AST 34  ALT 32  ALKPHOS 65  BILITOT 0.8  PROT 4.7*   ALBUMIN 2.5*   No results for input(s): LIPASE, AMYLASE in the last 168 hours. No results for input(s): AMMONIA in the last 168 hours.  CBC: Recent Labs  Lab 10/19/20 0402 10/21/20 0440 10/23/20 1823 10/24/20 0553  WBC 20.0* 23.9* 46.8* 36.5*  HGB 7.9* 9.1* 8.5* 8.3*  HCT 24.8* 28.2* 26.7* 25.2*  MCV 95.4 94.0 97.8 95.5  PLT 768* 791* 476* 403*    Cardiac Enzymes: No results for input(s): CKTOTAL, CKMB, CKMBINDEX, TROPONINI in the last 168 hours.  BNP (last 3 results) No results for input(s): BNP in the last 8760 hours.  ProBNP (last 3 results) No results for input(s): PROBNP in the last 8760 hours.  Radiological Exams: DG Abd 1 View  Result Date: 10/23/2020 CLINICAL DATA:  Vomiting. Patient had gastrostomy tube placed 4 days ago. EXAM: ABDOMEN - 1 VIEW COMPARISON:  Abdominal CT 10/15/2020 FINDINGS: Gastrostomy tube projects over the left upper quadrant. No bowel dilatation to suggest obstruction. Air-filled ascending and transverse colon, possible lucency outlining the ascending colon, cannot exclude free air on this supine view. There is no bowel dilatation. Tiny nonobstructing stone in the left kidney on prior CT is not well seen by radiograph. IMPRESSION: 1. Nonobstructive bowel gas pattern. 2. Air-filled ascending and transverse colon, possible lucency outlining the ascending colon, cannot exclude free air on this supine view. Consider decubitus or upright view for further evaluation. Patient had  recent percutaneous gastrostomy tube, which may be source of air if confirmed. These results will be called to the ordering clinician or representative by the Radiologist Assistant, and communication documented in the PACS or [employer] Dashboard. Electronically Signed   By: Narda RutherfordMelanie  Sanford M.D.   On: 10/23/2020 17:24   CT ANGIO CHEST PE W OR WO CONTRAST  Result Date: 10/23/2020 CLINICAL DATA:  Acute on chronic respiratory failure, COVID-19 pneumonia, Klebsiella pneumonia, sepsis,  peritonitis EXAM: CT ANGIOGRAPHY CHEST CT ABDOMEN AND PELVIS WITH CONTRAST TECHNIQUE: Multidetector CT imaging of the chest was performed using the standard protocol during bolus administration of intravenous contrast. Multiplanar CT image reconstructions and MIPs were obtained to evaluate the vascular anatomy. Multidetector CT imaging of the abdomen and pelvis was performed using the standard protocol during bolus administration of intravenous contrast. CONTRAST:  100mL OMNIPAQUE IOHEXOL 350 MG/ML SOLN COMPARISON:  10/15/2020 FINDINGS: CTA CHEST FINDINGS Cardiovascular: There is excellent opacification of the pulmonary arterial tree. There is no intraluminal filling defect to suggest acute pulmonary embolism. The central pulmonary arteries are of normal caliber. There is extensive coronary artery calcification. Mild left ventricular hypertrophy. Global cardiac size is within normal limits. No pericardial effusion. The thoracic aorta is normal. Right upper extremity PICC line tip is seen within the superior vena cava. Mediastinum/Nodes: Tracheostomy is in expected position, unchanged from prior examination. Mild pneumomediastinum persists, improved since prior examination. Similarly, subcutaneous gas within the left chest wall and neck base has nearly completely resolved with punctate foci of gas persistent subjacent to the left pectoralis minor. Visualized thyroid unremarkable. No pathologic thoracic adenopathy. The esophagus is unremarkable. Lungs/Pleura: Diffuse ground-glass pulmonary infiltrate and subpleural consolidation is again identified with traction bronchiectasis within the lung bases and increasing reticulation within the subpleural regions bilaterally in keeping with subacute to chronic infection and progressive basilar predominant fibrotic change. No new areas of focal consolidation identified. No pneumothorax or pleural effusion. No central obstructing mass. Small amount of layering mucus is seen  within the carina and central right and left mainstem bronchi. Musculoskeletal: No acute bone abnormality involving the thorax. Review of the MIP images confirms the above findings. CT ABDOMEN and PELVIS FINDINGS Hepatobiliary: No focal liver abnormality is seen. No gallstones, gallbladder wall thickening, or biliary dilatation. Pancreas: Unremarkable Spleen: Unremarkable Adrenals/Urinary Tract: The adrenal glands are unremarkable. The kidneys are normal. Foley catheter balloon is seen within a largely decompressed bladder lumen. The bladder is otherwise unremarkable. Stomach/Bowel: Balloon retention gastrostomy catheter is seen within the distal gastric body. Stomach, small bowel, and large bowel are otherwise unremarkable. Appendix normal. No free intraperitoneal gas or fluid. Vascular/Lymphatic: Mild aortoiliac atherosclerotic calcification. No aortic aneurysm. No pathologic adenopathy within the abdomen and pelvis. Reproductive: Prostate is unremarkable. Other: Rectum unremarkable. Musculoskeletal: No acute bone abnormality within the abdomen and pelvis. Review of the MIP images confirms the above findings. IMPRESSION: No pulmonary embolism. Persistent diffuse pulmonary infiltrate and subpleural consolidation in keeping with changes of COVID-19 pneumonia. Increasing subpleural reticulation and progressive bibasilar traction bronchiectasis in keeping with transition to a more subacute to chronic appearance with increasing fibrotic change at the lung bases bilaterally. Improving pneumomediastinum and nearly resolved subcutaneous gas within the neck base and left chest wall. Extensive coronary artery calcification Interval percutaneous gastrostomy.  No apparent complication. No acute intra-abdominal abnormality. Aortic Atherosclerosis (ICD10-I70.0). Electronically Signed   By: Helyn NumbersAshesh  Parikh MD   On: 10/23/2020 20:09   CT ABDOMEN PELVIS W CONTRAST  Result Date: 10/23/2020 CLINICAL DATA:  Acute on chronic  respiratory failure, COVID-19 pneumonia, Klebsiella pneumonia, sepsis, peritonitis EXAM: CT ANGIOGRAPHY CHEST CT ABDOMEN AND PELVIS WITH CONTRAST TECHNIQUE: Multidetector CT imaging of the chest was performed using the standard protocol during bolus administration of intravenous contrast. Multiplanar CT image reconstructions and MIPs were obtained to evaluate the vascular anatomy. Multidetector CT imaging of the abdomen and pelvis was performed using the standard protocol during bolus administration of intravenous contrast. CONTRAST:  OMNIPAQUE IOHEXOL 350 MG/ML SOLN COMPARISON:  10/15/2020 FINDINGS: CTA CHEST FINDINGS Cardiovascular: There is excellent opacification of the pulmonary arterial tree. There is no intraluminal filling defect to suggest acute pulmonary embolism. The central pulmonary arteries are of normal caliber. There is extensive coronary artery calcification. Mild left ventricular hypertrophy. Global cardiac size is within normal limits. No pericardial effusion. The thoracic aorta is normal. Right upper extremity PICC line tip is seen within the superior vena cava. Mediastinum/Nodes: Tracheostomy is in expected position, unchanged from prior examination. Mild pneumomediastinum persists, improved since prior examination. Similarly, subcutaneous gas within the left chest wall and neck base has nearly completely resolved with punctate foci of gas persistent subjacent to the left pectoralis minor. Visualized thyroid unremarkable. No pathologic thoracic adenopathy. The esophagus is unremarkable. Lungs/Pleura: Diffuse ground-glass pulmonary infiltrate and subpleural consolidation is again identified with traction bronchiectasis within the lung bases and increasing reticulation within the subpleural regions bilaterally in keeping with subacute to chronic infection and progressive basilar predominant fibrotic change. No new areas of focal consolidation identified. No pneumothorax or pleural effusion.  No central obstructing mass. Small amount of layering mucus is seen within the carina and central right and left mainstem bronchi. Musculoskeletal: No acute bone abnormality involving the thorax. Review of the MIP images confirms the above findings. CT ABDOMEN and PELVIS FINDINGS Hepatobiliary: No focal liver abnormality is seen. No gallstones, gallbladder wall thickening, or biliary dilatation. Pancreas: Unremarkable Spleen: Unremarkable Adrenals/Urinary Tract: The adrenal glands are unremarkable. The kidneys are normal. Foley catheter balloon is seen within a largely decompressed bladder lumen. The bladder is otherwise unremarkable. Stomach/Bowel: Balloon retention gastrostomy catheter is seen within the distal gastric body. Stomach, small bowel, and large bowel are otherwise unremarkable. Appendix normal. No free intraperitoneal gas or fluid. Vascular/Lymphatic: Mild aortoiliac atherosclerotic calcification. No aortic aneurysm. No pathologic adenopathy within the abdomen and pelvis. Reproductive: Prostate is unremarkable. Other: Rectum unremarkable. Musculoskeletal: No acute bone abnormality within the abdomen and pelvis. Review of the MIP images confirms the above findings. IMPRESSION: No pulmonary embolism. Persistent diffuse pulmonary infiltrate and subpleural consolidation in keeping with changes of COVID-19 pneumonia. Increasing subpleural reticulation and progressive bibasilar traction bronchiectasis in keeping with transition to a more subacute to chronic appearance with increasing fibrotic change at the lung bases bilaterally. Improving pneumomediastinum and nearly resolved subcutaneous gas within the neck base and left chest wall. Extensive coronary artery calcification Interval percutaneous gastrostomy.  No apparent complication. No acute intra-abdominal abnormality. Aortic Atherosclerosis (ICD10-I70.0). Electronically Signed   By: Helyn Numbers MD   On: 10/23/2020 20:09   DG CHEST PORT 1  VIEW  Result Date: 10/23/2020 CLINICAL DATA:  Tachycardia. EXAM: PORTABLE CHEST 1 VIEW COMPARISON:  Abdominal radiograph earlier this day. Chest radiograph 10/21/2020 chest CT 10/15/2020 FINDINGS: Tracheostomy tube tip at the thoracic inlet. Right upper extremity PICC tip in the SVC. Persistent low lung volumes and heterogeneous bilateral lung opacities. No pneumothorax. Pneumomediastinum on prior CT is not well demonstrated by radiograph. There is no evidence of free air in the upper abdomen. No large pleural effusion. IMPRESSION: 1.  Persistent low lung volumes and heterogeneous bilateral lung opacities, typical of COVID-19 sequela. 2. Pneumomediastinum on prior exams is not well demonstrated currently. There is no evidence of free air in the upper abdomen. 3. Tracheostomy tube and right upper extremity PICC in place. Electronically Signed   By: Narda Rutherford M.D.   On: 10/23/2020 18:36    Assessment/Plan Active Problems:   Acute on chronic respiratory failure with hypoxia (HCC)   COVID-19 virus infection   Acute infective tracheobronchitis   Pneumonia due to COVID-19 virus   Tracheostomy status (HCC)   1. Acute on chronic respiratory failure hypoxia plan is to continue with full support on pressure control patient has not been tolerating any attempts.  Right now is on none full support on pressure assist control with an FiO2 of 45%. 2. COVID-19 virus infection events in recovery alcohol supportive care 3. Tracheobronchitis treated ID is following as noted patient has significant chronic bronchiectasis noted may need to consider bronchoscopy for culture purposes 4. Pneumonia due to COVID-19 treated we will continue to follow 5. Tracheostomy will remain in place   I have personally seen and evaluated the patient, evaluated laboratory and imaging results, formulated the assessment and plan and placed orders. The Patient requires high complexity decision making with multiple systems  involvement.  Rounds were done with the Respiratory Therapy Director and Staff therapists and discussed with nursing staff also.  Yevonne Pax, MD [facility] Pulmonary Critical Care Medicine Sleep Medicine

## 2020-10-25 ENCOUNTER — Inpatient Hospital Stay (HOSPITAL_COMMUNITY)
Admission: RE | Admit: 2020-10-25 | Discharge: 2020-11-01 | DRG: 207 | Disposition: A | Discharge status: Other Acute Inpatient Hospital | Payer: BC Managed Care – PPO | Source: Skilled Nursing Facility | Attending: Pulmonary Disease | Admitting: Pulmonary Disease

## 2020-10-25 ENCOUNTER — Inpatient Hospital Stay (HOSPITAL_COMMUNITY): Payer: BC Managed Care – PPO

## 2020-10-25 ENCOUNTER — Other Ambulatory Visit (HOSPITAL_COMMUNITY): Payer: BC Managed Care – PPO

## 2020-10-25 ENCOUNTER — Encounter (HOSPITAL_COMMUNITY): Payer: BC Managed Care – PPO | Admitting: Certified Registered"

## 2020-10-25 DIAGNOSIS — J9312 Secondary spontaneous pneumothorax: Secondary | ICD-10-CM

## 2020-10-25 DIAGNOSIS — I10 Essential (primary) hypertension: Secondary | ICD-10-CM | POA: Diagnosis present

## 2020-10-25 DIAGNOSIS — J939 Pneumothorax, unspecified: Secondary | ICD-10-CM

## 2020-10-25 DIAGNOSIS — T8182XA Emphysema (subcutaneous) resulting from a procedure, initial encounter: Secondary | ICD-10-CM | POA: Diagnosis present

## 2020-10-25 DIAGNOSIS — E1165 Type 2 diabetes mellitus with hyperglycemia: Secondary | ICD-10-CM | POA: Diagnosis present

## 2020-10-25 DIAGNOSIS — Z9911 Dependence on respirator [ventilator] status: Secondary | ICD-10-CM | POA: Diagnosis not present

## 2020-10-25 DIAGNOSIS — J9620 Acute and chronic respiratory failure, unspecified whether with hypoxia or hypercapnia: Secondary | ICD-10-CM

## 2020-10-25 DIAGNOSIS — F1320 Sedative, hypnotic or anxiolytic dependence, uncomplicated: Secondary | ICD-10-CM | POA: Diagnosis present

## 2020-10-25 DIAGNOSIS — Y832 Surgical operation with anastomosis, bypass or graft as the cause of abnormal reaction of the patient, or of later complication, without mention of misadventure at the time of the procedure: Secondary | ICD-10-CM | POA: Diagnosis not present

## 2020-10-25 DIAGNOSIS — I959 Hypotension, unspecified: Secondary | ICD-10-CM | POA: Diagnosis present

## 2020-10-25 DIAGNOSIS — J9503 Malfunction of tracheostomy stoma: Secondary | ICD-10-CM | POA: Diagnosis not present

## 2020-10-25 DIAGNOSIS — I471 Supraventricular tachycardia: Secondary | ICD-10-CM | POA: Diagnosis not present

## 2020-10-25 DIAGNOSIS — D638 Anemia in other chronic diseases classified elsewhere: Secondary | ICD-10-CM | POA: Diagnosis present

## 2020-10-25 DIAGNOSIS — U099 Post covid-19 condition, unspecified: Secondary | ICD-10-CM | POA: Diagnosis present

## 2020-10-25 DIAGNOSIS — R011 Cardiac murmur, unspecified: Secondary | ICD-10-CM | POA: Diagnosis not present

## 2020-10-25 DIAGNOSIS — Z930 Tracheostomy status: Secondary | ICD-10-CM

## 2020-10-25 DIAGNOSIS — D62 Acute posthemorrhagic anemia: Secondary | ICD-10-CM | POA: Diagnosis present

## 2020-10-25 DIAGNOSIS — Y848 Other medical procedures as the cause of abnormal reaction of the patient, or of later complication, without mention of misadventure at the time of the procedure: Secondary | ICD-10-CM | POA: Diagnosis present

## 2020-10-25 DIAGNOSIS — J869 Pyothorax without fistula: Secondary | ICD-10-CM

## 2020-10-25 DIAGNOSIS — L8990 Pressure ulcer of unspecified site, unspecified stage: Secondary | ICD-10-CM | POA: Insufficient documentation

## 2020-10-25 DIAGNOSIS — Z794 Long term (current) use of insulin: Secondary | ICD-10-CM | POA: Diagnosis not present

## 2020-10-25 DIAGNOSIS — Z79899 Other long term (current) drug therapy: Secondary | ICD-10-CM

## 2020-10-25 DIAGNOSIS — J150 Pneumonia due to Klebsiella pneumoniae: Secondary | ICD-10-CM | POA: Diagnosis present

## 2020-10-25 DIAGNOSIS — J209 Acute bronchitis, unspecified: Secondary | ICD-10-CM | POA: Diagnosis not present

## 2020-10-25 DIAGNOSIS — E785 Hyperlipidemia, unspecified: Secondary | ICD-10-CM | POA: Diagnosis present

## 2020-10-25 DIAGNOSIS — J9601 Acute respiratory failure with hypoxia: Secondary | ICD-10-CM | POA: Diagnosis not present

## 2020-10-25 DIAGNOSIS — J95811 Postprocedural pneumothorax: Principal | ICD-10-CM | POA: Diagnosis present

## 2020-10-25 DIAGNOSIS — Z9689 Presence of other specified functional implants: Secondary | ICD-10-CM

## 2020-10-25 DIAGNOSIS — E440 Moderate protein-calorie malnutrition: Secondary | ICD-10-CM | POA: Insufficient documentation

## 2020-10-25 DIAGNOSIS — I2510 Atherosclerotic heart disease of native coronary artery without angina pectoris: Secondary | ICD-10-CM | POA: Diagnosis present

## 2020-10-25 DIAGNOSIS — E876 Hypokalemia: Secondary | ICD-10-CM | POA: Diagnosis present

## 2020-10-25 DIAGNOSIS — I495 Sick sinus syndrome: Secondary | ICD-10-CM | POA: Diagnosis present

## 2020-10-25 DIAGNOSIS — R001 Bradycardia, unspecified: Secondary | ICD-10-CM | POA: Diagnosis not present

## 2020-10-25 DIAGNOSIS — U071 COVID-19: Secondary | ICD-10-CM | POA: Diagnosis not present

## 2020-10-25 DIAGNOSIS — Z955 Presence of coronary angioplasty implant and graft: Secondary | ICD-10-CM

## 2020-10-25 DIAGNOSIS — J1282 Pneumonia due to coronavirus disease 2019: Secondary | ICD-10-CM | POA: Diagnosis not present

## 2020-10-25 DIAGNOSIS — J398 Other specified diseases of upper respiratory tract: Secondary | ICD-10-CM

## 2020-10-25 DIAGNOSIS — I469 Cardiac arrest, cause unspecified: Secondary | ICD-10-CM | POA: Diagnosis not present

## 2020-10-25 DIAGNOSIS — J9500 Unspecified tracheostomy complication: Secondary | ICD-10-CM

## 2020-10-25 DIAGNOSIS — J9621 Acute and chronic respiratory failure with hypoxia: Secondary | ICD-10-CM | POA: Diagnosis present

## 2020-10-25 DIAGNOSIS — J9622 Acute and chronic respiratory failure with hypercapnia: Secondary | ICD-10-CM | POA: Diagnosis present

## 2020-10-25 DIAGNOSIS — R0902 Hypoxemia: Secondary | ICD-10-CM

## 2020-10-25 DIAGNOSIS — Z938 Other artificial opening status: Secondary | ICD-10-CM

## 2020-10-25 DIAGNOSIS — J9600 Acute respiratory failure, unspecified whether with hypoxia or hypercapnia: Secondary | ICD-10-CM

## 2020-10-25 HISTORY — DX: Essential (primary) hypertension: I10

## 2020-10-25 HISTORY — DX: Gout, unspecified: M10.9

## 2020-10-25 HISTORY — DX: Anxiety disorder, unspecified: F41.9

## 2020-10-25 HISTORY — DX: Atherosclerotic heart disease of native coronary artery without angina pectoris: I25.10

## 2020-10-25 HISTORY — DX: Hyperlipidemia, unspecified: E78.5

## 2020-10-25 LAB — VANCOMYCIN, TROUGH: Vancomycin Tr: 25 ug/mL (ref 15–20)

## 2020-10-25 LAB — URINE CULTURE: Culture: NO GROWTH

## 2020-10-25 LAB — GLUCOSE, CAPILLARY: Glucose-Capillary: 182 mg/dL — ABNORMAL HIGH (ref 70–99)

## 2020-10-25 MED ORDER — INSULIN GLARGINE 100 UNIT/ML ~~LOC~~ SOLN
10.0000 [IU] | Freq: Two times a day (BID) | SUBCUTANEOUS | Status: DC
  Administered 2020-10-25 – 2020-11-01 (×14): 10 [IU] via SUBCUTANEOUS
  Filled 2020-10-25 (×15): qty 0.1

## 2020-10-25 MED ORDER — OXYCODONE HCL 5 MG PO TABS
5.0000 mg | ORAL_TABLET | Freq: Four times a day (QID) | ORAL | Status: DC
  Administered 2020-10-25: 5 mg via ORAL
  Filled 2020-10-25: qty 1

## 2020-10-25 MED ORDER — FENTANYL CITRATE (PF) 100 MCG/2ML IJ SOLN: 50.0000 ug | Freq: Once | INTRAMUSCULAR | Status: DC

## 2020-10-25 MED ORDER — METOPROLOL TARTRATE 25 MG PO TABS
25.0000 mg | ORAL_TABLET | Freq: Two times a day (BID) | ORAL | Status: DC
  Administered 2020-10-25: 25 mg via ORAL
  Filled 2020-10-25: qty 1

## 2020-10-25 MED ORDER — PANTOPRAZOLE SODIUM 40 MG IV SOLR
40.0000 mg | Freq: Every day | INTRAVENOUS | Status: DC
  Administered 2020-10-25 – 2020-10-31 (×7): 40 mg via INTRAVENOUS
  Filled 2020-10-25 (×7): qty 40

## 2020-10-25 MED ORDER — ONDANSETRON HCL 4 MG/2ML IJ SOLN: 4.0000 mg | Freq: Four times a day (QID) | INTRAMUSCULAR | Status: DC | PRN

## 2020-10-25 MED ORDER — DOCUSATE SODIUM 100 MG PO CAPS: 100.0000 mg | ORAL_CAPSULE | Freq: Two times a day (BID) | ORAL | Status: DC | PRN

## 2020-10-25 MED ORDER — GABAPENTIN 250 MG/5ML PO SOLN
200.0000 mg | Freq: Three times a day (TID) | ORAL | Status: DC
  Filled 2020-10-25 (×2): qty 4

## 2020-10-25 MED ORDER — SUCCINYLCHOLINE CHLORIDE 20 MG/ML IJ SOLN
INTRAMUSCULAR | Status: AC | PRN
  Administered 2020-10-25: 140 mg via INTRAVENOUS

## 2020-10-25 MED ORDER — CLONAZEPAM 1 MG PO TABS
1.0000 mg | ORAL_TABLET | Freq: Four times a day (QID) | ORAL | Status: DC
  Administered 2020-10-25: 1 mg via ORAL
  Filled 2020-10-25: qty 1

## 2020-10-25 MED ORDER — LIDOCAINE HCL (PF) 1 % IJ SOLN
INTRAMUSCULAR | Status: AC
  Filled 2020-10-25: qty 30

## 2020-10-25 MED ORDER — MIDAZOLAM 50MG/50ML (1MG/ML) PREMIX INFUSION
0.5000 mg/h | INTRAVENOUS | Status: DC
  Administered 2020-10-25: 10 mg/h via INTRAVENOUS
  Administered 2020-10-26: 8 mg/h via INTRAVENOUS
  Administered 2020-10-26: 10 mg/h via INTRAVENOUS
  Administered 2020-10-27: 6 mg/h via INTRAVENOUS
  Administered 2020-10-27: 4 mg/h via INTRAVENOUS
  Administered 2020-10-27: 0.5 mg/h via INTRAVENOUS
  Administered 2020-10-28 (×2): 4 mg/h via INTRAVENOUS
  Administered 2020-10-29: 2 mg/h via INTRAVENOUS
  Administered 2020-10-30: 1 mg/h via INTRAVENOUS
  Filled 2020-10-25 (×10): qty 50

## 2020-10-25 MED ORDER — THIAMINE HCL 100 MG PO TABS: 100.0000 mg | ORAL_TABLET | Freq: Every day | ORAL | Status: DC

## 2020-10-25 MED ORDER — QUETIAPINE FUMARATE 50 MG PO TABS
50.0000 mg | ORAL_TABLET | Freq: Every day | ORAL | Status: DC
  Administered 2020-10-25: 50 mg via ORAL
  Filled 2020-10-25: qty 1

## 2020-10-25 MED ORDER — ALLOPURINOL 300 MG PO TABS: 300.0000 mg | ORAL_TABLET | Freq: Every day | ORAL | Status: DC

## 2020-10-25 MED ORDER — SERTRALINE HCL 50 MG PO TABS
50.0000 mg | ORAL_TABLET | Freq: Every day | ORAL | Status: DC
  Administered 2020-10-26 – 2020-11-01 (×7): 50 mg
  Filled 2020-10-25 (×7): qty 1

## 2020-10-25 MED ORDER — FENTANYL BOLUS VIA INFUSION
50.0000 ug | INTRAVENOUS | Status: DC | PRN
  Administered 2020-10-27 – 2020-10-28 (×4): 50 ug via INTRAVENOUS
  Filled 2020-10-25: qty 50

## 2020-10-25 MED ORDER — PRASUGREL HCL 10 MG PO TABS: 10.0000 mg | ORAL_TABLET | Freq: Every day | ORAL | Status: DC

## 2020-10-25 MED ORDER — ROCURONIUM BROMIDE 10 MG/ML (PF) SYRINGE
PREFILLED_SYRINGE | INTRAVENOUS | Status: AC | PRN
  Administered 2020-10-25: 30 mg via INTRAVENOUS

## 2020-10-25 MED ORDER — FENTANYL 2500MCG IN NS 250ML (10MCG/ML) PREMIX INFUSION
0.0000 ug/h | INTRAVENOUS | Status: DC
  Administered 2020-10-25: 100 ug/h via INTRAVENOUS
  Administered 2020-10-27: 50 ug/h via INTRAVENOUS
  Administered 2020-10-28 – 2020-10-29 (×2): 100 ug/h via INTRAVENOUS
  Filled 2020-10-25 (×4): qty 250

## 2020-10-25 MED ORDER — AMANTADINE HCL 50 MG/5ML PO SOLN: 100.0000 mg | Freq: Every day | ORAL | Status: DC

## 2020-10-25 MED ORDER — METHYLPREDNISOLONE SODIUM SUCC 125 MG IJ SOLR
60.0000 mg | Freq: Every day | INTRAMUSCULAR | Status: DC
  Administered 2020-10-26 – 2020-10-28 (×3): 60 mg via INTRAVENOUS
  Filled 2020-10-25 (×3): qty 2

## 2020-10-25 MED ORDER — INSULIN ASPART 100 UNIT/ML ~~LOC~~ SOLN
0.0000 [IU] | SUBCUTANEOUS | Status: DC
  Administered 2020-10-25: 4 [IU] via SUBCUTANEOUS
  Administered 2020-10-26 (×2): 3 [IU] via SUBCUTANEOUS
  Administered 2020-10-27: 4 [IU] via SUBCUTANEOUS
  Administered 2020-10-27: 7 [IU] via SUBCUTANEOUS
  Administered 2020-10-27 (×3): 4 [IU] via SUBCUTANEOUS
  Administered 2020-10-27: 7 [IU] via SUBCUTANEOUS
  Administered 2020-10-28: 15 [IU] via SUBCUTANEOUS
  Administered 2020-10-28 (×2): 7 [IU] via SUBCUTANEOUS
  Administered 2020-10-28 (×2): 11 [IU] via SUBCUTANEOUS
  Administered 2020-10-28 – 2020-10-29 (×3): 7 [IU] via SUBCUTANEOUS
  Administered 2020-10-29: 4 [IU] via SUBCUTANEOUS
  Administered 2020-10-29: 7 [IU] via SUBCUTANEOUS
  Administered 2020-10-29: 11 [IU] via SUBCUTANEOUS
  Administered 2020-10-29 (×2): 7 [IU] via SUBCUTANEOUS
  Administered 2020-10-30: 4 [IU] via SUBCUTANEOUS
  Administered 2020-10-30: 7 [IU] via SUBCUTANEOUS
  Administered 2020-10-30 (×2): 4 [IU] via SUBCUTANEOUS
  Administered 2020-10-30: 11 [IU] via SUBCUTANEOUS
  Administered 2020-10-31: 7 [IU] via SUBCUTANEOUS
  Administered 2020-10-31: 11 [IU] via SUBCUTANEOUS
  Administered 2020-10-31 (×2): 7 [IU] via SUBCUTANEOUS
  Administered 2020-10-31: 4 [IU] via SUBCUTANEOUS
  Administered 2020-10-31: 15 [IU] via SUBCUTANEOUS
  Administered 2020-11-01: 4 [IU] via SUBCUTANEOUS
  Administered 2020-11-01: 3 [IU] via SUBCUTANEOUS
  Administered 2020-11-01: 7 [IU] via SUBCUTANEOUS
  Administered 2020-11-01: 4 [IU] via SUBCUTANEOUS

## 2020-10-25 MED ORDER — IPRATROPIUM-ALBUTEROL 0.5-2.5 (3) MG/3ML IN SOLN
3.0000 mL | RESPIRATORY_TRACT | Status: DC | PRN
  Administered 2020-10-30 – 2020-10-31 (×3): 3 mL via RESPIRATORY_TRACT
  Filled 2020-10-25 (×3): qty 3

## 2020-10-25 MED ORDER — CLONIDINE HCL 0.1 MG/24HR TD PTWK
0.1000 mg | MEDICATED_PATCH | TRANSDERMAL | Status: DC
  Administered 2020-10-25: 0.1 mg via TRANSDERMAL
  Filled 2020-10-25: qty 1

## 2020-10-25 MED ORDER — POLYETHYLENE GLYCOL 3350 17 G PO PACK: 17.0000 g | PACK | Freq: Every day | ORAL | Status: DC | PRN

## 2020-10-25 MED ORDER — FOLIC ACID 1 MG PO TABS: 1.0000 mg | ORAL_TABLET | Freq: Every day | ORAL | Status: DC

## 2020-10-25 NOTE — H&P (Addendum)
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  History of present illness   History per below and confirmed with wife at bedside:  "Lawrence Moradi Hensleyis an 49 y.o.malewith medical history significant of coronary disease, hypertension, hyperlipidemia, gout who was admitted to the acute facility after transfer from [facility] with COVID-19 pneumonia and pneumomediastinum. Apparently symptoms started on 08/09/2020, he tested positive for Covid on 08/15/2020. He was treated with remdesivir/dexamethasone and placed on high flow nasal cannula with subsequent pneumomediastinum. On 08/31/2019 when he was transferred to [facility] with initial improvement, weaned from BiPAP to high HFNC and then transferred out of the ICU. However, 09/05/2020 patient decompensated and was intubated. Lawrence Freeman was done on 09/20/2020. His hospital stay was complicated by ARDS, multiple infections."  Patient heavily sedated at time of evaluation. Was improving with intermittent PS trials. Today patient was to be downsized from size 9 to 6 cuffed shiley to facilitate weaning. Unforunately they were unable to pass new tracheostomy into stoma. Anesthesia called, multiple failed attempts at intubation.  >1 hour bagging. ENT called, placed 6-0 bivona into airway. Post these events, CXR showing large R PTX, extensive subcutaneous emphysema. Due to ER bed issues, patient to be direct admitted to ICU for management of unstable airway and pneumothorax.  Past Medical History  HTN HLD  Significant Hospital Events   08/15/20 +COVID 09/05/20 intubated  at [facility] 09/20/20 tracheostomy at [facility] 10/19/20 PEG by IR 10/25/20 trach exchange, prolonged bagging, [facility]  Consults:  ID, cardiology  Procedures:  Trach exchange by ENT 10/25/20  Significant Diagnostic Tests:  CXR: extensive subcutaneous emphysema, R PTX  Micro Data:  10/24/20 blood cx neg  Antimicrobials:  Complicated allergy history per Dr. Raymondo Freeman note (10/13/20)  He has finished course for MRSA PNA with vanc He is currently on vanc/inh tobra and cipro.  Interim history/subjective:  Consulted, patient heavily sedated.  Objective   There were no vitals taken for this visit.       No intake or output data in the 24 hours ending 10/25/20 2153 There were no vitals filed for this visit.  Examination: Chronically ill man in NAD, heavily sedated Trach in place without cuff leak, small thick secreitons Extensive subcutaneous crepitus, diminished breath sounds bilaterally Mouth with blood presumably from ETT attempts Abd soft, hypoactive BS, PEG in place Trace global anasarca Per nursing was following commands post events above  Labs will be ordered CXR R PTX, extensive chest wall emphysema  Resolved Hospital Problem list   N/A  Assessment & Plan:  Acute on chronic hypoxemic respiratory failure.  Initial insult COVID 19 complicated by HCAP and post-COVID fibrosis. - R chest tube - ENT followup for trach, should be fine with 6-0 bivona  Need for sedation for prolonged mechanical ventilation, benzodiazepine dependence, iatrogenic - Work toward oral agents: oxycodone, seroquel, gabapentin, zoloft - Wean off versed drip, seems like has been on this for weeks  Iatrogenic right pneumothorax- due to excessive bagging after inability to ventilate - R chest tube  Presumed cefepime-induced allergic reaction 10/21 on steroids (or were steroids for COP?) unclear. - Wean steroids  Question Klebsiella  PNA- looks like has received a good deal  of abx - Recheck tracheal aspirate, Pct - Hold abx for now - If needs abx, would do aztreonam or challenge with carbapenem while in ICU  DM worsened with steroids- on lantus 20 units BID PTA - 10 units BID lantus given NPO status, resistant SSI - Wean steroids  CAD, tachy-brady issues- per Lawrence Freeman, no issues at present, may have been worsened with precedex after discussion with wife  Small SDH- per records at OSH, was on prasurgel at [facility], moving all 4 ext, monitor neuro status intermittently  Best practice:  Diet: NPO, consider TF in AM Pain/Anxiety/Delirium protocol (if indicated): Fentanyl, versed VAP protocol (if indicated): in place DVT prophylaxis: on hold for procedures GI prophylaxis: PPI Glucose control: see above Mobility: BR Code Status: full Family Communication: updated wife at bedside Disposition: ICU pending stabilization of PTX and discussion with ENT, may be able to return to [facility] if better by tomorrow   Medical Decision Making    Diagnoses that are immediately life threatening include R PTX, acute on chronic respiratory failure, unstable airway Critical test findings: CXR large R PTX, extensive subcutaneous emphysema Interventions today to address these diagnoses are vent titration, sedation adjustment and chest tube Likelihood of life-threatening deterioration without intervention is high.  Labs   CBC: Recent Labs  Lab 10/19/20 0402 10/21/20 0440 10/23/20 1823 10/24/20 0553  WBC 20.0* 23.9* 46.8* 36.5*  HGB 7.9* 9.1* 8.5* 8.3*  HCT 24.8* 28.2* 26.7* 25.2*  MCV 95.4 94.0 97.8 95.5  PLT 768* 791* 476* 403*    Basic Metabolic Panel: Recent Labs  Lab 10/19/20 0402 10/20/20 0452 10/21/20 0440 10/23/20 1823 10/24/20 0553  NA 140 139 137 141 140  K 4.7 4.3 4.3 4.1 3.7  CL 96* 95* 95* 93* 94*  CO2 34* 35* 32 39* 36*  GLUCOSE 208* 162* 238* 86 100*  BUN 39* 34* 37* 36* 26*  CREATININE 0.94 0.84 0.81 0.81 0.62  CALCIUM 8.7* 8.4* 8.6* 8.4*  8.4*   GFR: CrCl cannot be calculated (Unknown ideal weight.). Recent Labs  Lab 10/19/20 0402 10/21/20 0440 10/23/20 1823 10/24/20 0553  WBC 20.0* 23.9* 46.8* 36.5*    Liver Function Tests: Recent Labs  Lab 10/23/20 1823  AST 34  ALT 32  ALKPHOS 65  BILITOT 0.8  PROT 4.7*  ALBUMIN 2.5*   No results for input(s): LIPASE, AMYLASE in the last 168 hours. No results for input(s): AMMONIA in the last 168 hours.  ABG    Component Value Date/Time   PHART 7.476 (H) 10/24/2020 0540   PCO2ART 53.4 (H) 10/24/2020 0540   PO2ART 64.6 (L) 10/24/2020 0540   HCO3 39.7 (H) 10/24/2020 0540   O2SAT 95.0 10/24/2020 0540     Coagulation Profile: Recent Labs  Lab 10/19/20 0402  INR 1.2    Cardiac Enzymes: No results for input(s): CKTOTAL, CKMB, CKMBINDEX, TROPONINI in the last 168 hours.  HbA1C: Hgb A1c MFr Bld  Date/Time Value Ref Range Status  10/12/2020 05:54 AM 6.5 (H) 4.8 - 5.6 % Final    Comment:    (NOTE)         Prediabetes: 5.7 - 6.4         Diabetes: >6.4         Glycemic control for adults with diabetes: <7.0     CBG: No results for input(s): GLUCAP in the last 168 hours.  Review of Systems:   Too sedated to answer  Past Medical History  He,  has a past medical history of Acute infective tracheobronchitis, Acute on chronic respiratory failure with hypoxia (HCC), COVID-19 virus infection, Pneumonia due to COVID-19 virus, and Tracheostomy status (HCC).   Surgical History   Trach  Social History      Family History   No family hx of ILD  Allergies Allergies  Allergen Reactions   Penicillins Anaphylaxis   Precedex [Dexmedetomidine Hcl In Nacl] Other (See Comments)    Severe bradycardia     Home Medications  N/A       Critical care time: 45 minutes

## 2020-10-25 NOTE — Progress Notes (Signed)
eLink Physician-Brief Progress Note Patient Name: Lawrence Freeman DOB: Mar 29, 1971 MRN: [id number]   Date of Service  10/25/2020  HPI/Events of Note  Review of CXR s/p R chest tube placement:  1. Near complete resolution of right pneumothorax after chest tube placement. 2. Persistent pneumomediastinum and subcutaneous gas. 3. Stable multifocal bilateral lung consolidation consistent with scarring or pneumonia.  eICU Interventions  Will notify ground team of CXR findings!     Intervention Category Major Interventions: Other:  Lenell Antu 10/25/2020, 11:05 PM

## 2020-10-25 NOTE — Progress Notes (Signed)
Pulmonary Critical Care Medicine [facility]   PULMONARY CRITICAL CARE SERVICE  PROGRESS NOTE  Date of Service: 10/25/2020  Lawrence Freeman  IOM:[id number]  DOB: July 06, 1971   DOA: 10/11/2020  Referring Physician: Carron Curie, MD  HPI: Lawrence Freeman is a 49 y.o. male seen for follow up of Acute on Chronic Respiratory Failure.  The patient is on full support on the ventilator which he failed to do about 4 hours and 45 minutes on pressure support.  Still has a rather large tracheostomy in place supposed to be switched out today  Medications: Reviewed on Rounds  Physical Exam:  Vitals: Temperature is 96.0 pulse 76 respiratory rate is 23 blood pressure is 104/64 saturations 95%  Ventilator Settings on pressure assist control FiO2 40% tidal volume 438 PEEP 5  . General: Comfortable at this time . Eyes: Grossly normal lids, irises & conjunctiva . ENT: grossly tongue is normal . Neck: no obvious mass . Cardiovascular: S1 S2 normal no gallop . Respiratory: No rhonchi very coarse breath sounds . Abdomen: soft . Skin: no rash seen on limited exam . Musculoskeletal: not rigid . Psychiatric:unable to assess . Neurologic: no seizure no involuntary movements         Lab Data:   Basic Metabolic Panel: Recent Labs  Lab 10/19/20 0402 10/20/20 0452 10/21/20 0440 10/23/20 1823 10/24/20 0553  NA 140 139 137 141 140  K 4.7 4.3 4.3 4.1 3.7  CL 96* 95* 95* 93* 94*  CO2 34* 35* 32 39* 36*  GLUCOSE 208* 162* 238* 86 100*  BUN 39* 34* 37* 36* 26*  CREATININE 0.94 0.84 0.81 0.81 0.62  CALCIUM 8.7* 8.4* 8.6* 8.4* 8.4*    ABG: Recent Labs  Lab 10/21/20 1521 10/23/20 1655 10/24/20 0540  PHART 7.311* 7.261* 7.476*  PCO2ART 71.6* 94.8* 53.4*  PO2ART 74.6* 86.1 64.6*  HCO3 35.1* 40.9* 39.7*  O2SAT 93.6 94.9 95.0    Liver Function Tests: Recent Labs  Lab 10/23/20 1823  AST 34  ALT 32  ALKPHOS 65  BILITOT 0.8  PROT 4.7*  ALBUMIN 2.5*   No  results for input(s): LIPASE, AMYLASE in the last 168 hours. No results for input(s): AMMONIA in the last 168 hours.  CBC: Recent Labs  Lab 10/19/20 0402 10/21/20 0440 10/23/20 1823 10/24/20 0553  WBC 20.0* 23.9* 46.8* 36.5*  HGB 7.9* 9.1* 8.5* 8.3*  HCT 24.8* 28.2* 26.7* 25.2*  MCV 95.4 94.0 97.8 95.5  PLT 768* 791* 476* 403*    Cardiac Enzymes: No results for input(s): CKTOTAL, CKMB, CKMBINDEX, TROPONINI in the last 168 hours.  BNP (last 3 results) No results for input(s): BNP in the last 8760 hours.  ProBNP (last 3 results) No results for input(s): PROBNP in the last 8760 hours.  Radiological Exams: CT HEAD WO CONTRAST  Result Date: 10/24/2020 CLINICAL DATA:  Altered mental status EXAM: CT HEAD WITHOUT CONTRAST TECHNIQUE: Contiguous axial images were obtained from the base of the skull through the vertex without intravenous contrast. COMPARISON:  None. FINDINGS: Brain: Mild atrophic changes are noted. There is a focal linear area of increased attenuation identified along the right frontal lobe laterally suspicious for a small subacute subdural hematoma. Correlate with any recent history of trauma. No other focal areas of hemorrhage are seen. No mass lesion or changes of acute infarct are noted. Vascular: No hyperdense vessel or unexpected calcification. Skull: Normal. Negative for fracture or focal lesion. Sinuses/Orbits: Mastoid effusions are noted bilaterally slightly greater on the right  than the left. Other: None. IMPRESSION: Findings along the right frontal lobe laterally consistent with a small subacute subdural hematoma. This measures approximately 4 mm in greatest dimension and shows no significant mass effect. Correlate with any recent trauma. Mastoid effusions bilaterally as described. These results will be called to the ordering clinician or representative by the Radiologist Assistant, and communication documented in the PACS or [employer]. Electronically Signed   By:  Alcide Clever M.D.   On: 10/24/2020 17:44   DG CHEST PORT 1 VIEW  Result Date: 10/25/2020 CLINICAL DATA:  Rapid response intubation. EXAM: PORTABLE CHEST 1 VIEW COMPARISON:  Radiograph and chest CT 10/23/2020 FINDINGS: There is a new tracheostomy tube tip with tip projecting approximately 4.3 cm from the carina. Moderate-sized right pneumothorax of approximately 40%, new from prior exam. Large amount of subcutaneous emphysema involving the chest walls, also new. No obvious left pneumothorax. Pneumomediastinum on prior chest CT is not well seen on the current exam. Right upper extremity PICC remains in place. There heterogeneous bilateral lung opacities not significantly changed. Stable heart size and mediastinal contours. IMPRESSION: 1. New tracheostomy tube with tip projecting approximately 4.3 cm from the carina. 2. Moderate right pneumothorax of approximately 40%, new from prior exam. Large amount of subcutaneous emphysema involving both chest walls, also new. 3. Unchanged bilateral heterogeneous lung opacities. Critical Value/emergent results were called by telephone at the time of interpretation on 10/25/2020 at 6:28 pm to Dr Luna Kitchens , who verbally acknowledged these results. Electronically Signed   By: Narda Rutherford M.D.   On: 10/25/2020 18:28    Assessment/Plan Active Problems:   Acute on chronic respiratory failure with hypoxia (HCC)   COVID-19 virus infection   Acute infective tracheobronchitis   Pneumonia due to COVID-19 virus   Tracheostomy status (HCC)   1. Acute on chronic respiratory failure hypoxia patient remains on pressure control wean on pressure support as tolerated. 2. COVID-19 virus infection in recovery we will continue with supportive care 3. Acute infective bronchitis has been treated with antibiotics 4. Pneumonia due to COVID-19 treated in resolution 5. Tracheostomy will be switched out to her today to actually downsize   I have personally seen and evaluated  the patient, evaluated laboratory and imaging results, formulated the assessment and plan and placed orders. The Patient requires high complexity decision making with multiple systems involvement.  Rounds were done with the Respiratory Therapy Director and Staff therapists and discussed with nursing staff also.  Yevonne Pax, MD [facility] Pulmonary Critical Care Medicine Sleep Medicine

## 2020-10-25 NOTE — Procedures (Signed)
Insertion of Chest Tube Procedure Note  Lawrence Freeman  [id number]  1971/10/16  Date:10/25/20  Time:9:59 PM    Provider Performing: Lorin Glass   Procedure: Chest Tube Insertion [id number])  Indication(s) Pneumothorax  Consent Risks of the procedure as well as the alternatives and risks of each were explained to the patient and/or caregiver.  Consent for the procedure was obtained and is signed in the bedside chart  Anesthesia Topical only with 1% lidocaine    Time Out Verified patient identification, verified procedure, site/side was marked, verified correct patient position, special equipment/implants available, medications/allergies/relevant history reviewed, required imaging and test results available.   Sterile Technique Maximal sterile technique including full sterile barrier drape, hand hygiene, sterile gown, sterile gloves, mask, hair covering, sterile ultrasound probe cover (if used).   Procedure Description Ultrasound used to identify appropriate pleural anatomy for placement and overlying skin marked. Area of placement cleaned and draped in sterile fashion.  A 28 French chest tube was placed into the right pleural space using Kelly dissection. Appropriate return of air was obtained.  The tube was connected to atrium and placed on -20 cm H2O wall suction.   Complications/Tolerance None; patient tolerated the procedure well. Chest X-ray is ordered to verify placement.   EBL Minimal  Specimen(s) none

## 2020-10-26 ENCOUNTER — Inpatient Hospital Stay (HOSPITAL_COMMUNITY): Payer: BC Managed Care – PPO

## 2020-10-26 ENCOUNTER — Other Ambulatory Visit: Payer: Self-pay

## 2020-10-26 ENCOUNTER — Encounter (HOSPITAL_COMMUNITY): Payer: Self-pay | Admitting: Internal Medicine

## 2020-10-26 DIAGNOSIS — D62 Acute posthemorrhagic anemia: Secondary | ICD-10-CM

## 2020-10-26 DIAGNOSIS — L8990 Pressure ulcer of unspecified site, unspecified stage: Secondary | ICD-10-CM | POA: Insufficient documentation

## 2020-10-26 DIAGNOSIS — J939 Pneumothorax, unspecified: Secondary | ICD-10-CM | POA: Diagnosis not present

## 2020-10-26 DIAGNOSIS — Z9911 Dependence on respirator [ventilator] status: Secondary | ICD-10-CM

## 2020-10-26 DIAGNOSIS — Z930 Tracheostomy status: Secondary | ICD-10-CM

## 2020-10-26 DIAGNOSIS — J9601 Acute respiratory failure with hypoxia: Secondary | ICD-10-CM

## 2020-10-26 DIAGNOSIS — Z9689 Presence of other specified functional implants: Secondary | ICD-10-CM

## 2020-10-26 LAB — CBC
HCT: 20 % — ABNORMAL LOW (ref 39.0–52.0)
HCT: 20.9 % — ABNORMAL LOW (ref 39.0–52.0)
HCT: 25 % — ABNORMAL LOW (ref 39.0–52.0)
HCT: 25.3 % — ABNORMAL LOW (ref 39.0–52.0)
Hemoglobin: 6.4 g/dL — CL (ref 13.0–17.0)
Hemoglobin: 6.6 g/dL — CL (ref 13.0–17.0)
Hemoglobin: 7.7 g/dL — ABNORMAL LOW (ref 13.0–17.0)
Hemoglobin: 8 g/dL — ABNORMAL LOW (ref 13.0–17.0)
MCH: 30.6 pg (ref 26.0–34.0)
MCH: 30.8 pg (ref 26.0–34.0)
MCH: 31.1 pg (ref 26.0–34.0)
MCH: 32 pg (ref 26.0–34.0)
MCHC: 30.4 g/dL (ref 30.0–36.0)
MCHC: 31.6 g/dL (ref 30.0–36.0)
MCHC: 32 g/dL (ref 30.0–36.0)
MCHC: 32 g/dL (ref 30.0–36.0)
MCV: 100 fL (ref 80.0–100.0)
MCV: 101.2 fL — ABNORMAL HIGH (ref 80.0–100.0)
MCV: 96.8 fL (ref 80.0–100.0)
MCV: 97.3 fL (ref 80.0–100.0)
Platelets: 198 10*3/uL (ref 150–400)
Platelets: 231 10*3/uL (ref 150–400)
Platelets: 244 10*3/uL (ref 150–400)
Platelets: 287 10*3/uL (ref 150–400)
RBC: 2 MIL/uL — ABNORMAL LOW (ref 4.22–5.81)
RBC: 2.16 MIL/uL — ABNORMAL LOW (ref 4.22–5.81)
RBC: 2.5 MIL/uL — ABNORMAL LOW (ref 4.22–5.81)
RBC: 2.57 MIL/uL — ABNORMAL LOW (ref 4.22–5.81)
RDW: 19.4 % — ABNORMAL HIGH (ref 11.5–15.5)
RDW: 19.6 % — ABNORMAL HIGH (ref 11.5–15.5)
RDW: 19.6 % — ABNORMAL HIGH (ref 11.5–15.5)
RDW: 19.9 % — ABNORMAL HIGH (ref 11.5–15.5)
WBC: 14.5 10*3/uL — ABNORMAL HIGH (ref 4.0–10.5)
WBC: 17.1 10*3/uL — ABNORMAL HIGH (ref 4.0–10.5)
WBC: 26.5 10*3/uL — ABNORMAL HIGH (ref 4.0–10.5)
WBC: 36.1 10*3/uL — ABNORMAL HIGH (ref 4.0–10.5)
nRBC: 0 % (ref 0.0–0.2)
nRBC: 0.1 % (ref 0.0–0.2)
nRBC: 0.1 % (ref 0.0–0.2)
nRBC: 0.2 % (ref 0.0–0.2)

## 2020-10-26 LAB — POCT I-STAT 7, (LYTES, BLD GAS, ICA,H+H)
Acid-Base Excess: 18 mmol/L — ABNORMAL HIGH (ref 0.0–2.0)
Acid-Base Excess: 19 mmol/L — ABNORMAL HIGH (ref 0.0–2.0)
Bicarbonate: 43.2 mmol/L — ABNORMAL HIGH (ref 20.0–28.0)
Bicarbonate: 44.5 mmol/L — ABNORMAL HIGH (ref 20.0–28.0)
Calcium, Ion: 1.21 mmol/L (ref 1.15–1.40)
Calcium, Ion: 1.19 mmol/L (ref 1.15–1.40)
HCT: 19 % — ABNORMAL LOW (ref 39.0–52.0)
HCT: 25 % — ABNORMAL LOW (ref 39.0–52.0)
Hemoglobin: 6.5 g/dL — CL (ref 13.0–17.0)
Hemoglobin: 8.5 g/dL — ABNORMAL LOW (ref 13.0–17.0)
O2 Saturation: 96 %
O2 Saturation: 97 %
Patient temperature: 97.7
Patient temperature: 97.8
Potassium: 3.6 mmol/L (ref 3.5–5.1)
Potassium: 3.6 mmol/L (ref 3.5–5.1)
Sodium: 142 mmol/L (ref 135–145)
Sodium: 143 mmol/L (ref 135–145)
TCO2: 45 mmol/L — ABNORMAL HIGH (ref 22–32)
TCO2: 46 mmol/L — ABNORMAL HIGH (ref 22–32)
pCO2 arterial: 58.7 mmHg — ABNORMAL HIGH (ref 32.0–48.0)
pCO2 arterial: 59.3 mmHg — ABNORMAL HIGH (ref 32.0–48.0)
pH, Arterial: 7.473 — ABNORMAL HIGH (ref 7.350–7.450)
pH, Arterial: 7.482 — ABNORMAL HIGH (ref 7.350–7.450)
pO2, Arterial: 78 mmHg — ABNORMAL LOW (ref 83.0–108.0)
pO2, Arterial: 84 mmHg (ref 83.0–108.0)

## 2020-10-26 LAB — GLUCOSE, CAPILLARY
Glucose-Capillary: 117 mg/dL — ABNORMAL HIGH (ref 70–99)
Glucose-Capillary: 81 mg/dL (ref 70–99)
Glucose-Capillary: 88 mg/dL (ref 70–99)
Glucose-Capillary: 146 mg/dL — ABNORMAL HIGH (ref 70–99)
Glucose-Capillary: 126 mg/dL — ABNORMAL HIGH (ref 70–99)

## 2020-10-26 LAB — BLOOD GAS, ARTERIAL
Acid-Base Excess: 10.8 mmol/L — ABNORMAL HIGH (ref 0.0–2.0)
Bicarbonate: 38.4 mmol/L — ABNORMAL HIGH (ref 20.0–28.0)
Drawn by: 347621
FIO2: 100
O2 Saturation: 99.7 %
Patient temperature: 36.7
pCO2 arterial: 94.1 mmHg (ref 32.0–48.0)
pH, Arterial: 7.233 — ABNORMAL LOW (ref 7.350–7.450)
pO2, Arterial: 182 mmHg — ABNORMAL HIGH (ref 83.0–108.0)

## 2020-10-26 LAB — BASIC METABOLIC PANEL
Anion gap: 9 (ref 5–15)
Anion gap: 8 (ref 5–15)
BUN: 26 mg/dL — ABNORMAL HIGH (ref 6–20)
BUN: 23 mg/dL — ABNORMAL HIGH (ref 6–20)
CO2: 37 mmol/L — ABNORMAL HIGH (ref 22–32)
CO2: 38 mmol/L — ABNORMAL HIGH (ref 22–32)
Calcium: 8.1 mg/dL — ABNORMAL LOW (ref 8.9–10.3)
Calcium: 8.2 mg/dL — ABNORMAL LOW (ref 8.9–10.3)
Chloride: 93 mmol/L — ABNORMAL LOW (ref 98–111)
Chloride: 97 mmol/L — ABNORMAL LOW (ref 98–111)
Creatinine, Ser: 0.6 mg/dL — ABNORMAL LOW (ref 0.61–1.24)
Creatinine, Ser: 0.57 mg/dL — ABNORMAL LOW (ref 0.61–1.24)
GFR, Estimated: >60 mL/min (ref >60)
GFR, Estimated: >60 mL/min (ref >60)
Glucose, Bld: 195 mg/dL — ABNORMAL HIGH (ref 70–99)
Glucose, Bld: 93 mg/dL (ref 70–99)
Potassium: 3.3 mmol/L — ABNORMAL LOW (ref 3.5–5.1)
Potassium: 3.4 mmol/L — ABNORMAL LOW (ref 3.5–5.1)
Sodium: 139 mmol/L (ref 135–145)
Sodium: 143 mmol/L (ref 135–145)

## 2020-10-26 LAB — PREPARE RBC (CROSSMATCH)

## 2020-10-26 LAB — TROPONIN I (HIGH SENSITIVITY): Troponin I (High Sensitivity): 24 ng/L — ABNORMAL HIGH (ref <18)

## 2020-10-26 LAB — PHOSPHORUS
Phosphorus: 4.9 mg/dL — ABNORMAL HIGH (ref 2.5–4.6)
Phosphorus: 3.4 mg/dL (ref 2.5–4.6)

## 2020-10-26 LAB — MAGNESIUM
Magnesium: 1.7 mg/dL (ref 1.7–2.4)
Magnesium: 1.7 mg/dL (ref 1.7–2.4)

## 2020-10-26 LAB — ABO/RH: ABO/RH(D): B NEG

## 2020-10-26 LAB — PROCALCITONIN: Procalcitonin: 0.25 ng/mL

## 2020-10-26 LAB — MRSA PCR SCREENING: MRSA by PCR: NEGATIVE

## 2020-10-26 MED ORDER — MAGNESIUM SULFATE 2 GM/50ML IV SOLN
2.0000 g | Freq: Once | INTRAVENOUS | Status: AC
  Administered 2020-10-26: 2 g via INTRAVENOUS
  Filled 2020-10-26: qty 50

## 2020-10-26 MED ORDER — PROSOURCE TF PO LIQD: 45.0000 mL | Freq: Two times a day (BID) | ORAL | Status: DC

## 2020-10-26 MED ORDER — NOREPINEPHRINE 4 MG/250ML-% IV SOLN: 0.0000 ug/min | INTRAVENOUS | Status: DC

## 2020-10-26 MED ORDER — OSMOLITE 1.5 CAL PO LIQD
1000.0000 mL | ORAL | Status: DC
  Administered 2020-10-26 – 2020-11-01 (×7): 1000 mL

## 2020-10-26 MED ORDER — METOPROLOL TARTRATE 25 MG PO TABS: 25.0000 mg | ORAL_TABLET | Freq: Two times a day (BID) | ORAL | Status: DC

## 2020-10-26 MED ORDER — ORAL CARE MOUTH RINSE
15.0000 mL | OROMUCOSAL | Status: DC
  Administered 2020-10-26 – 2020-11-01 (×60): 15 mL via OROMUCOSAL

## 2020-10-26 MED ORDER — THIAMINE HCL 100 MG PO TABS
100.0000 mg | ORAL_TABLET | Freq: Every day | ORAL | Status: DC
  Administered 2020-10-26 – 2020-11-01 (×7): 100 mg
  Filled 2020-10-26 (×7): qty 1

## 2020-10-26 MED ORDER — GABAPENTIN 250 MG/5ML PO SOLN
200.0000 mg | Freq: Three times a day (TID) | ORAL | Status: DC
  Administered 2020-10-26 – 2020-11-01 (×18): 200 mg
  Filled 2020-10-26 (×23): qty 4

## 2020-10-26 MED ORDER — HYDRALAZINE HCL 20 MG/ML IJ SOLN
10.0000 mg | INTRAMUSCULAR | Status: DC | PRN
  Administered 2020-10-26 – 2020-10-30 (×5): 10 mg via INTRAVENOUS
  Filled 2020-10-26 (×8): qty 1

## 2020-10-26 MED ORDER — CLONAZEPAM 0.5 MG PO TBDP
1.0000 mg | ORAL_TABLET | Freq: Four times a day (QID) | ORAL | Status: DC
  Administered 2020-10-26 – 2020-10-31 (×21): 1 mg
  Filled 2020-10-26 (×22): qty 2

## 2020-10-26 MED ORDER — FOLIC ACID 1 MG PO TABS
1.0000 mg | ORAL_TABLET | Freq: Every day | ORAL | Status: DC
  Administered 2020-10-26 – 2020-11-01 (×7): 1 mg
  Filled 2020-10-26 (×7): qty 1

## 2020-10-26 MED ORDER — JUVEN PO PACK
1.0000 | PACK | Freq: Two times a day (BID) | ORAL | Status: DC
  Administered 2020-10-27 – 2020-11-01 (×12): 1
  Filled 2020-10-26 (×12): qty 1

## 2020-10-26 MED ORDER — POTASSIUM CHLORIDE 20 MEQ/15ML (10%) PO SOLN
40.0000 meq | Freq: Once | ORAL | Status: AC
  Administered 2020-10-26: 40 meq
  Filled 2020-10-26: qty 30

## 2020-10-26 MED ORDER — CHLORHEXIDINE GLUCONATE 0.12% ORAL RINSE (MEDLINE KIT)
15.0000 mL | Freq: Two times a day (BID) | OROMUCOSAL | Status: DC
  Administered 2020-10-26 – 2020-11-01 (×13): 15 mL via OROMUCOSAL

## 2020-10-26 MED ORDER — POLYETHYLENE GLYCOL 3350 17 G PO PACK: 17.0000 g | PACK | Freq: Every day | ORAL | Status: DC | PRN

## 2020-10-26 MED ORDER — QUETIAPINE FUMARATE 50 MG PO TABS
50.0000 mg | ORAL_TABLET | Freq: Every day | ORAL | Status: DC
  Administered 2020-10-26 – 2020-10-31 (×6): 50 mg
  Filled 2020-10-26 (×6): qty 1

## 2020-10-26 MED ORDER — DOCUSATE SODIUM 50 MG/5ML PO LIQD: 100.0000 mg | Freq: Two times a day (BID) | ORAL | Status: DC | PRN

## 2020-10-26 MED ORDER — AMANTADINE HCL 50 MG/5ML PO SOLN
100.0000 mg | Freq: Every day | ORAL | Status: DC
  Administered 2020-10-26 – 2020-11-01 (×7): 100 mg
  Filled 2020-10-26 (×7): qty 10

## 2020-10-26 MED ORDER — ALLOPURINOL 300 MG PO TABS
300.0000 mg | ORAL_TABLET | Freq: Every day | ORAL | Status: DC
  Administered 2020-10-26 – 2020-11-01 (×7): 300 mg
  Filled 2020-10-26 (×7): qty 1

## 2020-10-26 MED ORDER — ALBUMIN HUMAN 25 % IV SOLN
25.0000 g | Freq: Four times a day (QID) | INTRAVENOUS | Status: AC
  Administered 2020-10-26 (×4): 25 g via INTRAVENOUS
  Filled 2020-10-26 (×4): qty 100

## 2020-10-26 MED ORDER — OXYCODONE HCL 5 MG PO TABS
5.0000 mg | ORAL_TABLET | Freq: Four times a day (QID) | ORAL | Status: DC
  Administered 2020-10-26 – 2020-10-28 (×9): 5 mg
  Filled 2020-10-26 (×9): qty 1

## 2020-10-26 MED ORDER — PROSOURCE TF PO LIQD
45.0000 mL | Freq: Three times a day (TID) | ORAL | Status: DC
  Administered 2020-10-26 – 2020-11-01 (×17): 45 mL
  Filled 2020-10-26 (×16): qty 45

## 2020-10-26 MED ORDER — NOREPINEPHRINE 4 MG/250ML-% IV SOLN
INTRAVENOUS | Status: AC
  Administered 2020-10-26: 4 ug/min via INTRAVENOUS
  Filled 2020-10-26: qty 250

## 2020-10-26 MED ORDER — VITAL HIGH PROTEIN PO LIQD: 1000.0000 mL | ORAL | Status: DC

## 2020-10-26 MED ORDER — SODIUM CHLORIDE 0.9% IV SOLUTION: Freq: Once | INTRAVENOUS | Status: DC

## 2020-10-26 MED ORDER — CHLORHEXIDINE GLUCONATE CLOTH 2 % EX PADS
6.0000 | MEDICATED_PAD | Freq: Every day | CUTANEOUS | Status: DC
  Administered 2020-10-27 – 2020-11-01 (×3): 6 via TOPICAL

## 2020-10-26 MED ORDER — PRASUGREL HCL 10 MG PO TABS
10.0000 mg | ORAL_TABLET | Freq: Every day | ORAL | Status: DC
  Administered 2020-10-26: 10 mg
  Filled 2020-10-26: qty 1

## 2020-10-26 NOTE — Consult Note (Addendum)
WOC Nurse Consult Note: Reason for Consult: Consult requested for sacrum/buttocks Wound type: Left upper buttock with stage 2 pressure injury Pressure Injury POA: Yes Measurement: 2X.2X.1cm, pink and dry Drainage (amount, consistency, odor) no odor or drainage Periwound:  Intact skin surrounding Dressing procedure/placement/frequency: Topical treatment orders provided for bedside nurses to perform as follows to promote healing: Foam dressing to sacrum/buttocks, change Q 3 days or PRN soiling. Please re-consult if further assistance is needed.  Thank-you,  Cammie Mcgee MSN, RN, CWOCN, [employer], CNS [phone]

## 2020-10-26 NOTE — Progress Notes (Signed)
Called emergently to [facility] to assess patient whose tracheostomy site had been downsized with subsequent inability to oxygenate. Patient had significant crepitus in upper chest and face. Patient's trach had been removed and another provider was trying to intubate from above. CRNA did DL with glidescope, intubated , direct visualization of cords and tube passing through. Unable to ventilate, with crepitus increasing in face. Patient had a false lumen of trachea or possible tracheal tear.  ETT removed, and patient was able to be masked successfully while covering open trach site. We had a FOB brought to room, paged ENT Dr. Ezzard Standing stat and prepared to attempt replacing trach. Dr. Ezzard Standing used his scope to look thru trach site, was able to visualize tracheal rings, and we passed Bovina trach over FOB. Positive color change with CO2 indicator, oxygen saturation increased.  Stat CXR ordered, and attending physicians notified

## 2020-10-26 NOTE — Progress Notes (Signed)
CRITICAL VALUE ALERT  Critical Value: hgb 6.6  Date & Time Notied: 11/3 1551  Provider Notified: Dr. Leandrew Koyanagi to face  Orders Received/Actions taken: redraw

## 2020-10-26 NOTE — Progress Notes (Signed)
RT Note:  Received patient from [facility]. Placed patient on current settings of PC 22 Rate 36 100% +5.  Patient has #6 Portex. Bleeding/drainage from trach site.

## 2020-10-26 NOTE — Progress Notes (Signed)
Initial Nutrition Assessment  DOCUMENTATION CODES:   Non-severe (moderate) malnutrition in context of acute illness/injury  INTERVENTION:   Tube feeding:  -Osmolite 1.5 @ 20 ml/hr via PEG -Increase by 10 ml Q4 hours to 60 ml/hr (1440 ml) -ProSource 45 ml TID  Provides: 2280 kcals, 123 grams protein, 1098 ml free water.   Add Juven BID, each serving provides 95kcal and 2.5g of protein (amino acids glutamine and arginine)  NUTRITION DIAGNOSIS:   Moderate Malnutrition related to acute illness (COVID ARDS) as evidenced by mild fat depletion, moderate muscle depletion.  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 Presents this admission with PTX after trach change complication.    11/2- trach exchange per ENT, prolonged bagging, [facility]  Pt discussed during ICU rounds and with RN.   Requiring levophed. Weaning off steroids. Monitor for CBG within ICU range. Okay to start feeding via PEG.   Weight history limited in chart. Utilize 75.3 kg as EDW for now. Shows muscle and fat depletion. Suspect bilateral lower extremity wasting is multifactorial given limited mobility.   Patient requiring ventilator support via trach MV: 14.7 L/min Temp (24hrs), Avg:98.2 F (36.8 C), Min:97.7 F (36.5 C), Max:99 F (37.2 C)   UOP: 1095 ml x 24 hrs  Chest tube (R): 30 ml x 24 hrs   Drips: albumin, magnesium sulfate, levophed Medications: folic acid, SS novolog, lantus, solumedrol, thiamine  Labs: CBG 86-117  NUTRITION - FOCUSED PHYSICAL EXAM:    Most Recent Value  Orbital Region No depletion   Upper Arm Region Moderate depletion  Thoracic and Lumbar Region Unable to assess  Buccal Region Mild depletion  Temple Region Mild depletion  Clavicle Bone Region Moderate depletion  Clavicle and Acromion Bone Region Moderate depletion  Scapular Bone Region Unable to assess  Dorsal Hand Mild depletion  Patellar Region Severe depletion  Anterior Thigh Region Severe depletion  Posterior Calf Region Severe depletion  Edema (RD Assessment) Mild  Hair Reviewed  Eyes Reviewed  Mouth Unable to assess  Skin Reviewed  Nails Unable to assess     Diet Order:   Diet Order            Diet NPO time specified  Diet effective now                 EDUCATION NEEDS:   Not appropriate for education at this time  Skin:  Skin Assessment: Skin Integrity Issues: Skin Integrity Issues:: Stage II Stage II: buttocks  Last BM:  10/31  Height:   Ht Readings from Last 1 Encounters:  10/25/20 5\' 4"  (1.626 m)    Weight:   Wt Readings from Last 1 Encounters:  10/26/20 75.3 kg    BMI:  Body mass index is 28.49 kg/m.  Estimated Nutritional Needs:   Kcal:  2100-2300 kcal  Protein:  115-130 g  Fluid:  >/= 2 L/day  13/03/21 RD, LDN Clinical Nutrition Pager listed in AMION

## 2020-10-26 NOTE — Progress Notes (Signed)
RT Note:  Tracheal Aspirate sent to lab 

## 2020-10-26 NOTE — Progress Notes (Signed)
eLink Physician-Brief Progress Note Patient Name: Lawrence Freeman DOB: [birth year] MRN: [id number]   Date of Service  10/26/2020  HPI/Events of Note  Hypertension - BP = 185/95  eICU Interventions  Plan: 1. Hydralazine 10 mg IV Q 4 hours PRN SBP > 170 or DBP > 100.     Intervention Category Major Interventions: Hypertension - evaluation and management  Zephaniah Enyeart Eugene 10/26/2020, 11:03 PM

## 2020-10-26 NOTE — Progress Notes (Signed)
Initially FiO2 improved to 50% with sats in 90s now drifting down.  Attempts at various modes maxing out MV at 15LPM.  Discussed with RT, fine to keep as is, goal pH > 7.2.  Pressures also down a bit, some oozing from trach site and chest tube site.  Nothing alarming on repeat CXR.  Can start levophed PRN, will give some albumin as well.  Myrla Halsted MD PCCM

## 2020-10-26 NOTE — Plan of Care (Signed)

## 2020-10-26 NOTE — Progress Notes (Addendum)
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  History of present illness   History per below and confirmed with wife at bedside:  "Lawrence Romey Hensleyis an 49 y.o.malewith medical history significant of coronary disease, hypertension, hyperlipidemia, gout who was admitted to the acute facility after transfer from [facility] with COVID-19 pneumonia and pneumomediastinum. Apparently symptoms started on 08/09/2020, he tested positive for Covid on 08/15/2020. He was treated with remdesivir/dexamethasone and placed on high flow nasal cannula with subsequent pneumomediastinum. On 08/31/2019 when he was transferred to [facility] with initial improvement, weaned from BiPAP to high HFNC and then transferred out of the ICU. However, 09/05/2020 patient decompensated and was intubated. Janina Mayo was done on 09/20/2020. His hospital stay was complicated by ARDS, multiple infections."  Patient heavily sedated at time of evaluation. Was improving with intermittent PS trials. Today patient was to be downsized from size 9 to 6 cuffed shiley to facilitate weaning. Unforunately they were unable to pass new tracheostomy into stoma. Anesthesia called, multiple failed attempts at intubation.  >1 hour bagging. ENT called, placed 6-0 bivona into airway. Post these events, CXR showing large R PTX, extensive subcutaneous emphysema. Due to ER bed issues, patient to be direct admitted to ICU for management of unstable airway and pneumothorax.  Past Medical History  HTN HLD  Significant Hospital Events   08/15/20 +COVID 09/05/20 intubated  at [facility] 09/20/20 tracheostomy at [facility] 10/19/20 PEG by IR 10/25/20 trach exchange, prolonged bagging, transfer [facility] to [facility] 11/2 large bore chest tube R   Consults:  ID, cardiology  Procedures:  Trach exchange by ENT 10/25/20 11/2 R chest tube  Significant Diagnostic Tests:  11/1 CXR:>> extensive subcutaneous emphysema, R PTX 11/3 CXR>>no visible pneumothorax, continued pneumomediastinum and subc emphysema with patchy bilateral airspace opacities  Micro Data:  10/24/20 blood cx neg  Antimicrobials:  Complicated allergy history per Dr. Raymondo Band note (10/13/20)  He has finished course for MRSA PNA with vanc He is currently on vanc/inh tobra and cipro.  Interim history/subjective:  Consulted, patient heavily sedated.  Objective   Blood pressure 107/66, pulse (!) 59, temperature 97.7 F (36.5 C), temperature source Axillary, resp. rate (!) 33, height 5\' 4"  (1.626 m), weight 75.3 kg, SpO2 99 %.    Vent Mode: PCV FiO2 (%):  [60 %-100 %] 60 % Set Rate:  [33 bmp-36 bmp] 33 bmp PEEP:  [5 cmH20] 5 cmH20 Plateau Pressure:  [26 cmH20-30 cmH20] 30 cmH20   Intake/Output Summary (Last 24 hours) at 10/26/2020 0756 Last data filed at 10/26/2020 0700 Gross per 24 hour  Intake 335.95 ml  Output 1125 ml  Net -789.05 ml   Filed Weights   10/25/20 2147 10/26/20 0500  Weight: 75.3 kg 75.3 kg    General:  Thin, chronically ill appearing M sedated on vent  HEENT: MM pink/moist, trach in place without bleeding Neuro: initially not opening eyes or following commands on versed and fentanyl, improved and opening eyes with reduced sedation CV: s1s2 rrr, no m/r/g PULM:  On full vent support, no  Bleeding from new trach GI: soft, bsx4 active  Extremities: warm/dry,  edema  Skin: no rashes  or lesions   Resolved Hospital Problem list   N/A  Assessment & Plan:    Acute on chronic hypoxemic respiratory failure with iatrogenic  Large R pneumothorax After prolonged bagging  during difficulty changing trach and poor ventilation, Initial insult COVID 19 dx'd 07/2021, complicated by HCAP and post-COVID fibrosis requiring trach and LTAC P: - R chest tube in place with resolution of Pneumo on repeat CXR, continue on suction - ENT followup for trach, should be fine with 6-0 bivona  Need for sedation for prolonged mechanical ventilation, benzodiazepine dependence, iatrogenic Decreased sedation this AM and patient calm and awake P: - Work toward oral agents: oxycodone, seroquel, gabapentin, zoloft - Wean off versed drip, seems like has been on this for weeks  Acute Anemia Hgb 6.5 this AM down from 7.7 P: -Transfuse 1 unit PRBC's and trend CBC -hold heparin and Effient  Presumed cefepime-induced allergic reaction 10/21  on steroids (or were steroids for COPD?) unclear. P: - Wean steroids   Klebsiella PNA On tracheal aspirate culture done at [facility] and treated with Azactam though had worsening fever and leukocytosis, treated with Cefepime at [facility] then switched to Cipro as well as fluconazone, seen by ID 10/29, cultures in Epic negative since October P: - Recheck tracheal aspirate, Pct 0.25 will trend - Hold abx for now, monitor for fever, worsening respiratory infiltrates  DM worsened with steroids on lantus 20 units BID PTA P: - 10 units BID lantus given NPO status, resistant SSI - Wean steroids  CAD, tachy-brady issues, history 3-vessel disease with 2 stents placed 05/2019 On Effient, bradycardic while on Precedex P: -Cardiology has been consulted, no issues at present -hold Effient and heparin  Small SDH  per records at OSH, was on prasurgel at [facility], moving all 4 ext, monitor neuro status intermittently P: -CT head for acute neuro changes  Best practice:  Diet: NPO, start TF Pain/Anxiety/Delirium protocol (if indicated): Fentanyl, versed VAP protocol (if indicated): in place DVT prophylaxis: on hold for procedures GI prophylaxis:  PPI Glucose control: SSI Mobility: BR Code Status: full Family Communication: updated wife at bedside Disposition: ICU for monitoring today, if stable may be able to transition back to [facility] tomorrow   Labs   CBC: Recent Labs  Lab 10/21/20 0440 10/23/20 1823 10/24/20 0553 10/25/20 2358 10/26/20 0528  WBC 23.9* 46.8* 36.5* 36.1*  --   HGB 9.1* 8.5* 8.3* 7.7* 6.5*  HCT 28.2* 26.7* 25.2* 25.3* 19.0*  MCV 94.0 97.8 95.5 101.2*  --   PLT 791* 476* 403* 287  --     Basic Metabolic Panel: Recent Labs  Lab 10/21/20 0440 10/21/20 0440 10/23/20 1823 10/24/20 0553 10/25/20 2358 10/26/20 0528 10/26/20 0622  NA 137   < > 141 140 139 142 143  K 4.3   < > 4.1 3.7 3.3* 3.6 3.4*  CL 95*  --  93* 94* 93*  --  97*  CO2 32  --  39* 36* 37*  --  38*  GLUCOSE 238*  --  86 100* 195*  --  93  BUN 37*  --  36* 26* 26*  --  23*  CREATININE 0.81  --  0.81 0.62 0.60*  --  0.57*  CALCIUM 8.6*  --  8.4* 8.4* 8.1*  --  8.2*  MG  --   --   --   --  1.7  --  1.7  PHOS  --   --   --   --  4.9*  --  3.4   < > = values in this interval not displayed.   GFR: Estimated Creatinine Clearance: 102.5 mL/min (A) (by C-G formula based on SCr of 0.57 mg/dL (L)). Recent Labs  Lab 10/21/20 0440 10/23/20 1823 10/24/20 0553 10/25/20 2358  PROCALCITON  --   --   --  0.25  WBC 23.9* 46.8* 36.5* 36.1*    Liver Function Tests: Recent Labs  Lab 10/23/20 1823  AST 34  ALT 32  ALKPHOS 65  BILITOT 0.8  PROT 4.7*  ALBUMIN 2.5*   No results for input(s): LIPASE, AMYLASE in the last 168 hours. No results for input(s): AMMONIA in the last 168 hours.  ABG    Component Value Date/Time   PHART 7.473 (H) 10/26/2020 0528   PCO2ART 58.7 (H) 10/26/2020 0528   PO2ART 78 (L) 10/26/2020 0528   HCO3 43.2 (H) 10/26/2020 0528   TCO2 45 (H) 10/26/2020 0528   O2SAT 96.0 10/26/2020 0528     Coagulation Profile: No results for input(s): INR, PROTIME in the last 168 hours.  Cardiac Enzymes: No results for  input(s): CKTOTAL, CKMB, CKMBINDEX, TROPONINI in the last 168 hours.  HbA1C: Hgb A1c MFr Bld  Date/Time Value Ref Range Status  10/12/2020 05:54 AM 6.5 (H) 4.8 - 5.6 % Final    Comment:    (NOTE)         Prediabetes: 5.7 - 6.4         Diabetes: >6.4         Glycemic control for adults with diabetes: <7.0     CBG: Recent Labs  Lab 10/25/20 2342 10/26/20 0348  GLUCAP 182* 117*    Review of Systems:   Too sedated to answer  Past Medical History  He,  has a past medical history of Acute infective tracheobronchitis, Acute on chronic respiratory failure with hypoxia (HCC), COVID-19 virus infection, Pneumonia due to COVID-19 virus, and Tracheostomy status (HCC).   Surgical History   Trach  Social History      Family History   No family hx of ILD  Allergies Allergies  Allergen Reactions  . Penicillins Anaphylaxis  . Precedex [Dexmedetomidine Hcl In Nacl] Other (See Comments)    Severe bradycardia     Home Medications  N/A       Critical care time: 35 minutes      CRITICAL CARE Performed by: Darcella Gasman Yulissa Needham   Total critical care time: 35 minutes  Critical care time was exclusive of separately billable procedures and treating other patients.  Critical care was necessary to treat or prevent imminent or life-threatening deterioration.  Critical care was time spent personally by me on the following activities: development of treatment plan with patient and/or surrogate as well as nursing, discussions with consultants, evaluation of patient's response to treatment, examination of patient, obtaining history from patient or surrogate, ordering and performing treatments and interventions, ordering and review of laboratory studies, ordering and review of radiographic studies, pulse oximetry and re-evaluation of patient's condition.   Darcella Gasman Aubre Quincy, PA-C [place] [facility]  Pager# [phone], if no answer [phone]

## 2020-10-26 NOTE — Progress Notes (Signed)
CRITICAL VALUE ALERT  Critical Value:  Hgb 6.4  Date & Time Notied:  10/26/20 0800  Provider Notified: Vernona Rieger Gleason PA  Orders Received/Actions taken: Type and Screen per provider

## 2020-10-26 NOTE — Plan of Care (Signed)
  Problem: Education: Goal: Knowledge of General Education information will improve Description: Including pain rating scale, medication(s)/side effects and non-pharmacologic comfort measures 10/26/2020 0259 by Hulda Marin, RN Outcome: Progressing 10/26/2020 0259 by Hulda Marin, RN Outcome: Progressing   Problem: Health Behavior/Discharge Planning: Goal: Ability to manage health-related needs will improve 10/26/2020 0259 by Hulda Marin, RN Outcome: Progressing 10/26/2020 0259 by Hulda Marin, RN Outcome: Progressing   Problem: Clinical Measurements: Goal: Ability to maintain clinical measurements within normal limits will improve 10/26/2020 0259 by Hulda Marin, RN Outcome: Progressing 10/26/2020 0259 by Hulda Marin, RN Outcome: Progressing Goal: Will remain free from infection 10/26/2020 0259 by Hulda Marin, RN Outcome: Progressing 10/26/2020 0259 by Hulda Marin, RN Outcome: Progressing Goal: Diagnostic test results will improve 10/26/2020 0259 by Hulda Marin, RN Outcome: Progressing 10/26/2020 0259 by Hulda Marin, RN Outcome: Progressing Goal: Respiratory complications will improve 10/26/2020 0259 by Hulda Marin, RN Outcome: Progressing 10/26/2020 0259 by Hulda Marin, RN Outcome: Progressing Goal: Cardiovascular complication will be avoided 10/26/2020 0259 by Hulda Marin, RN Outcome: Progressing 10/26/2020 0259 by Hulda Marin, RN Outcome: Progressing   Problem: Activity: Goal: Risk for activity intolerance will decrease 10/26/2020 0259 by Hulda Marin, RN Outcome: Progressing 10/26/2020 0259 by Hulda Marin, RN Outcome: Progressing   Problem: Nutrition: Goal: Adequate nutrition will be maintained 10/26/2020 0259 by Hulda Marin, RN Outcome: Progressing 10/26/2020 0259 by Hulda Marin, RN Outcome: Progressing   Problem: Coping: Goal: Level  of anxiety will decrease 10/26/2020 0259 by Hulda Marin, RN Outcome: Progressing 10/26/2020 0259 by Hulda Marin, RN Outcome: Progressing   Problem: Elimination: Goal: Will not experience complications related to bowel motility 10/26/2020 0259 by Hulda Marin, RN Outcome: Progressing 10/26/2020 0259 by Hulda Marin, RN Outcome: Progressing Goal: Will not experience complications related to urinary retention 10/26/2020 0259 by Hulda Marin, RN Outcome: Progressing 10/26/2020 0259 by Hulda Marin, RN Outcome: Progressing   Problem: Pain Managment: Goal: General experience of comfort will improve 10/26/2020 0259 by Hulda Marin, RN Outcome: Progressing 10/26/2020 0259 by Hulda Marin, RN Outcome: Progressing   Problem: Safety: Goal: Ability to remain free from injury will improve 10/26/2020 0259 by Hulda Marin, RN Outcome: Progressing 10/26/2020 0259 by Hulda Marin, RN Outcome: Progressing   Problem: Skin Integrity: Goal: Risk for impaired skin integrity will decrease 10/26/2020 0259 by Hulda Marin, RN Outcome: Progressing 10/26/2020 0259 by Hulda Marin, RN Outcome: Progressing

## 2020-10-27 ENCOUNTER — Inpatient Hospital Stay (HOSPITAL_COMMUNITY): Payer: BC Managed Care – PPO

## 2020-10-27 DIAGNOSIS — R001 Bradycardia, unspecified: Secondary | ICD-10-CM

## 2020-10-27 DIAGNOSIS — Z930 Tracheostomy status: Secondary | ICD-10-CM | POA: Diagnosis not present

## 2020-10-27 DIAGNOSIS — J939 Pneumothorax, unspecified: Secondary | ICD-10-CM | POA: Diagnosis not present

## 2020-10-27 DIAGNOSIS — Z9911 Dependence on respirator [ventilator] status: Secondary | ICD-10-CM | POA: Diagnosis not present

## 2020-10-27 DIAGNOSIS — E440 Moderate protein-calorie malnutrition: Secondary | ICD-10-CM | POA: Insufficient documentation

## 2020-10-27 LAB — BASIC METABOLIC PANEL
Anion gap: 13 (ref 5–15)
BUN: 23 mg/dL — ABNORMAL HIGH (ref 6–20)
CO2: 32 mmol/L (ref 22–32)
Calcium: 8.5 mg/dL — ABNORMAL LOW (ref 8.9–10.3)
Chloride: 95 mmol/L — ABNORMAL LOW (ref 98–111)
Creatinine, Ser: 0.72 mg/dL (ref 0.61–1.24)
GFR, Estimated: >60 mL/min (ref >60)
Glucose, Bld: 221 mg/dL — ABNORMAL HIGH (ref 70–99)
Potassium: 4 mmol/L (ref 3.5–5.1)
Sodium: 140 mmol/L (ref 135–145)

## 2020-10-27 LAB — POCT I-STAT 7, (LYTES, BLD GAS, ICA,H+H)
Acid-Base Excess: 13 mmol/L — ABNORMAL HIGH (ref 0.0–2.0)
Bicarbonate: 41.2 mmol/L — ABNORMAL HIGH (ref 20.0–28.0)
Calcium, Ion: 1.21 mmol/L (ref 1.15–1.40)
HCT: 24 % — ABNORMAL LOW (ref 39.0–52.0)
Hemoglobin: 8.2 g/dL — ABNORMAL LOW (ref 13.0–17.0)
O2 Saturation: 93 %
Patient temperature: 98.1
Potassium: 3.9 mmol/L (ref 3.5–5.1)
Sodium: 139 mmol/L (ref 135–145)
TCO2: 43 mmol/L — ABNORMAL HIGH (ref 22–32)
pCO2 arterial: 76.9 mmHg (ref 32.0–48.0)
pH, Arterial: 7.335 — ABNORMAL LOW (ref 7.350–7.450)
pO2, Arterial: 76 mmHg — ABNORMAL LOW (ref 83.0–108.0)

## 2020-10-27 LAB — TYPE AND SCREEN
ABO/RH(D): B NEG
Antibody Screen: NEGATIVE
Unit division: 0

## 2020-10-27 LAB — PHOSPHORUS: Phosphorus: 4.3 mg/dL (ref 2.5–4.6)

## 2020-10-27 LAB — BPAM RBC
Blood Product Expiration Date: 202111182359
ISSUE DATE / TIME: 202111030945
Unit Type and Rh: 1700

## 2020-10-27 LAB — CBC
HCT: 24.6 % — ABNORMAL LOW (ref 39.0–52.0)
Hemoglobin: 7.7 g/dL — ABNORMAL LOW (ref 13.0–17.0)
MCH: 31.2 pg (ref 26.0–34.0)
MCHC: 31.3 g/dL (ref 30.0–36.0)
MCV: 99.6 fL (ref 80.0–100.0)
Platelets: 195 10*3/uL (ref 150–400)
RBC: 2.47 MIL/uL — ABNORMAL LOW (ref 4.22–5.81)
RDW: 20 % — ABNORMAL HIGH (ref 11.5–15.5)
WBC: 14.7 10*3/uL — ABNORMAL HIGH (ref 4.0–10.5)
nRBC: 0.2 % (ref 0.0–0.2)

## 2020-10-27 LAB — GLUCOSE, CAPILLARY
Glucose-Capillary: 158 mg/dL — ABNORMAL HIGH (ref 70–99)
Glucose-Capillary: 171 mg/dL — ABNORMAL HIGH (ref 70–99)
Glucose-Capillary: 173 mg/dL — ABNORMAL HIGH (ref 70–99)
Glucose-Capillary: 187 mg/dL — ABNORMAL HIGH (ref 70–99)
Glucose-Capillary: 219 mg/dL — ABNORMAL HIGH (ref 70–99)
Glucose-Capillary: 244 mg/dL — ABNORMAL HIGH (ref 70–99)

## 2020-10-27 LAB — MAGNESIUM: Magnesium: 2 mg/dL (ref 1.7–2.4)

## 2020-10-27 LAB — TROPONIN I (HIGH SENSITIVITY): Troponin I (High Sensitivity): 38 ng/L — ABNORMAL HIGH (ref <18)

## 2020-10-27 MED ORDER — METOPROLOL TARTRATE 25 MG PO TABS
25.0000 mg | ORAL_TABLET | Freq: Two times a day (BID) | ORAL | Status: DC
  Filled 2020-10-27: qty 1

## 2020-10-27 MED ORDER — SODIUM CHLORIDE 0.9 % IV SOLN
INTRAVENOUS | Status: DC | PRN
  Administered 2020-10-27 – 2020-10-28 (×2): 250 mL via INTRAVENOUS

## 2020-10-27 MED ORDER — AMIODARONE HCL IN DEXTROSE 360-4.14 MG/200ML-% IV SOLN
60.0000 mg/h | INTRAVENOUS | Status: DC
  Administered 2020-10-27 (×2): 60 mg/h via INTRAVENOUS
  Filled 2020-10-27: qty 200

## 2020-10-27 MED ORDER — AMIODARONE HCL IN DEXTROSE 360-4.14 MG/200ML-% IV SOLN
30.0000 mg/h | INTRAVENOUS | Status: DC
  Administered 2020-10-27: 30 mg/h via INTRAVENOUS
  Filled 2020-10-27 (×2): qty 200

## 2020-10-27 MED ORDER — METOPROLOL TARTRATE 25 MG PO TABS: 25.0000 mg | ORAL_TABLET | Freq: Two times a day (BID) | ORAL | Status: DC

## 2020-10-27 MED ORDER — METOPROLOL TARTRATE 25 MG PO TABS
25.0000 mg | ORAL_TABLET | Freq: Two times a day (BID) | ORAL | Status: DC
  Administered 2020-10-27: 25 mg

## 2020-10-27 MED ORDER — HEPARIN SODIUM (PORCINE) 5000 UNIT/ML IJ SOLN
5000.0000 [IU] | Freq: Three times a day (TID) | INTRAMUSCULAR | Status: DC
  Administered 2020-10-27 – 2020-11-01 (×15): 5000 [IU] via SUBCUTANEOUS
  Filled 2020-10-27 (×15): qty 1

## 2020-10-27 MED ORDER — AMIODARONE LOAD VIA INFUSION
150.0000 mg | Freq: Once | INTRAVENOUS | Status: AC
  Administered 2020-10-27: 150 mg via INTRAVENOUS
  Filled 2020-10-27: qty 83.34

## 2020-10-27 MED ORDER — LISINOPRIL 5 MG PO TABS: 5.0000 mg | ORAL_TABLET | Freq: Every day | ORAL | Status: DC

## 2020-10-27 MED ORDER — METOPROLOL TARTRATE 5 MG/5ML IV SOLN
2.5000 mg | INTRAVENOUS | Status: DC | PRN
  Administered 2020-10-28 (×2): 2.5 mg via INTRAVENOUS
  Filled 2020-10-27 (×2): qty 5

## 2020-10-27 MED ORDER — METOPROLOL TARTRATE 12.5 MG HALF TABLET
12.5000 mg | ORAL_TABLET | Freq: Two times a day (BID) | ORAL | Status: DC
  Filled 2020-10-27: qty 1

## 2020-10-27 NOTE — Progress Notes (Signed)
eLink Physician-Brief Progress Note Patient Name: Lawrence Freeman DOB: 26-Jan-1971 MRN: [id number]   Date of Service  10/27/2020  HPI/Events of Note  Patient experienced bradycardia to HR = 0 --> Asystole. CPR X 1 minute with ROSC. BP now = 169/102 and HR = 89. Sat = 100%.  eICU Interventions  Plan: 1. 12 Lead EKG STAT. 2. Cycle Troponin. 3. Will request that ground team evaluate the patient at bedside.      Intervention Category Major Interventions: Other:  Lenell Antu 10/27/2020, 8:04 PM

## 2020-10-27 NOTE — Progress Notes (Signed)
Brief bradycardia>> PEA arrest associated with increased WOB and desats. No epi given. Chest compressions < 1 min. ROSC with tracheal lavage. Patient awake, moving all 4 ext. Diminished breath sounds BL, no air leak in chest tube. CXR pending. HD stable. Sats okay. Will d/c metoprolol. F/u CXR.  Discussed with bedside RN.  Myrla Halsted MD PCCM

## 2020-10-27 NOTE — Progress Notes (Signed)
Attempt to call wife to update on code blue and patients condition. No answer and no ability to leave a message.

## 2020-10-27 NOTE — Progress Notes (Signed)
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  History of present illness   History per below and confirmed with wife at bedside:  "Harrol Novello Hensleyis an 49 y.o.malewith medical history significant of coronary disease, hypertension, hyperlipidemia, gout who was admitted to the acute facility after transfer from [facility] with COVID-19 pneumonia and pneumomediastinum. Apparently symptoms started on 08/09/2020, he tested positive for Covid on 08/15/2020. He was treated with remdesivir/dexamethasone and placed on high flow nasal cannula with subsequent pneumomediastinum. On 08/31/2019 when he was transferred to [facility] with initial improvement, weaned from BiPAP to high HFNC and then transferred out of the ICU. However, 09/05/2020 patient decompensated and was intubated. Janina Mayo was done on 09/20/2020. His hospital stay was complicated by ARDS, multiple infections."  Patient heavily sedated at time of evaluation. Was improving with intermittent PS trials. Today patient was to be downsized from size 9 to 6 cuffed shiley to facilitate weaning. Unforunately they were unable to pass new tracheostomy into stoma. Anesthesia called, multiple failed attempts at intubation.  >1 hour bagging. ENT called, placed 6-0 bivona into airway. Post these events, CXR showing large R PTX, extensive subcutaneous emphysema. Due to ER bed issues, patient to be direct admitted to ICU for management of unstable airway and pneumothorax.  Past Medical History  HTN HLD  Significant Hospital Events   08/15/20 +COVID 09/05/20 intubated  at [facility] 09/20/20 tracheostomy at [facility] 10/19/20 PEG by IR 10/25/20 trach exchange, prolonged bagging, [facility] 11/2 large bore chest tube R   Consults:  ID, cardiology  Procedures:  Trach exchange by ENT 10/25/20 11/2 R chest tube  Significant Diagnostic Tests:  11/1 CXR:>> extensive subcutaneous emphysema, R PTX 11/3 CXR>>no visible pneumothorax, continued pneumomediastinum and subc emphysema with patchy bilateral airspace opacities  Micro Data:  10/24/20 blood cx neg  Antimicrobials:  Complicated allergy history per Dr. Raymondo Band note (10/13/20)  He has finished course for MRSA PNA with vanc He was previously on vanc/inh tobra and cipro prior to transfer from [facility]  Interim history/subjective:  Heavily sedated this morning. Mental status improved with weaning, following commands, denies pain.   Objective   Blood pressure (!) 146/93, pulse 96, temperature 97.8 F (36.6 C), temperature source Oral, resp. rate (!) 31, height 5\' 10"  (1.778 m), weight 74.3 kg, SpO2 96 %.    Vent Mode: PCV FiO2 (%):  [40 %-60 %] 40 % Set Rate:  [33 bmp] 33 bmp PEEP:  [5 cmH20] 5 cmH20 Plateau Pressure:  [29 cmH20-31 cmH20] 30 cmH20   Intake/Output Summary (Last 24 hours) at 10/27/2020 0703 Last data filed at 10/27/2020 0600 Gross per 24 hour  Intake 1665.12 ml  Output 2025 ml  Net -359.88 ml   Filed Weights   10/25/20 2147 10/26/20 0500 10/27/20 0500  Weight: 75.3 kg 75.3 kg 74.3 kg   General:  Thin, chronically ill appearing M, awake, following commands HEENT: MM pink/moist, trach in place without bleeding Neuro: awake, following commands, answering some questions CV: s1s2 rrr, IV/VI systolic and diastolic murmur  PULM:  On full vent support, no bleeding from trach, clear breath sounds bilaterally  GI: soft,  bsx4 active  Extremities: warm/dry, edema  Skin: no rashes or lesions   Resolved Hospital Problem list   Pneumothorax   Assessment & Plan:    Acute on chronic hypoxemic respiratory failure with iatrogenic  Large R pneumothorax After prolonged bagging during difficulty changing trach and poor ventilation, Initial insult COVID 19 dx'd 07/2021, complicated by HCAP and post-COVID fibrosis requiring trach and LTAC. Trach change complicated by creation of false lumen requiring placement of 6-0 brivana. Resolution of pneumothorax with no leak of chest tube.  P: - R chest tube in place with resolution of Pneumo on repeat CXR, continue on suction - Dr. Ezzard Standing with ENT consulted, appreciate recs. Plan for trach change to 7-0 brivana today. All future trach changes will need to be planned with ENT due to complications.  - consult to [facility] with possible transfer back to [facility] later today   Need for sedation for prolonged mechanical ventilation, benzodiazepine dependence, iatrogenic Sedation increased overnight with agitation and tachycardia. Now weaning with improvement in mental status. Will need to wean slowly with possible withdrawal symptoms yesterday.  P: - Work toward oral agents: oxycodone, seroquel, gabapentin, zoloft - Wean precedex and propofol  Acute Anemia Baseline ~8 since admission, prior hemoglobin within normal limits in September. Trended down to 6.5 yesterday, no s/p 1U, stable at 7.7 this morning. Discussed with [facility], no history of work-up that they know of, likely secondary to chronic disease.  P: - ferritin, B12   - trend CBC - continue hold heparin and Effient  Presumed cefepime-induced allergic reaction 10/21  on steroids (or were steroids for COPD?) unclear. P: - Wean steroids   Klebsiella PNA On tracheal aspirate culture done at [facility] and treated with Azactam though had worsening fever and leukocytosis, treated with Cefepime at [facility] then switched to Cipro as well as fluconazone, seen by ID 10/29, cultures in Epic negative since October P: - Recheck tracheal aspirate, Pct 0.25 11/02 - Hold abx for  now, monitor for fever, worsening respiratory infiltrates  DM worsened with steroids on lantus 20 units BID PTA P: - 10 units BID lantus given NPO status, resistant SSI - Wean steroids  CAD, tachy-brady issues, history 3-vessel disease with 2 stents placed 05/2019 Sinus Tach w/intermittent SVT HTN On Effient, bradycardic while on Precedex. On Clonidine and metoprolol 25 mg bid at [facility]. Ep of sinus tachycardia overnight, started on amiodarone  P: -Cardiology has been consulted, no issues at present -hold Effient and heparin - stop amio, restart metoprolol 25 mg bid - stop clonidine, requiring pressor support on admission, now weaned  Small SDH  per records at OSH, was on prasurgel at [facility], moving all 4 ext, monitor neuro status intermittently. Moving extremities, answering questions 11/4.  P: -CT head for acute neuro changes  Stage II Pressure Ulcer WC consulted.   Best practice:  Diet: NPO, start TF Pain/Anxiety/Delirium protocol (if indicated): Fentanyl, versed VAP protocol (if indicated): in place DVT prophylaxis: on hold for bleeding requiring transfusion, SCDs GI prophylaxis: PPI Glucose control: SSI Mobility: BR Code Status: full Family Communication: attempted to call wife, no VM Disposition: likely transition back to [facility] today    Labs   CBC: Recent Labs  Lab 10/25/20 2358 10/26/20 0528 10/26/20 0622 10/26/20 0622 10/26/20 1610 10/26/20 1519 10/26/20 1629 10/27/20 0059 10/27/20 0115  WBC 36.1*  --  26.5*  --   --  14.5* 17.1*  --  14.7*  HGB 7.7*   < > 6.4*   < > 8.5*  6.6* 8.0* 8.2* 7.7*  HCT 25.3*   < > 20.0*   < > 25.0* 20.9* 25.0* 24.0* 24.6*  MCV 101.2*  --  100.0  --   --  96.8 97.3  --  99.6  PLT 287  --  244  --   --  198 231  --  195   < > = values in this interval not displayed.    Basic Metabolic Panel: Recent Labs  Lab 10/23/20 1823 10/23/20 1823 10/24/20 0553 10/24/20 0553 10/25/20 2358 10/25/20 2358 10/26/20 0528  10/26/20 0622 10/26/20 0937 10/27/20 0059 10/27/20 0115  NA 141   < > 140   < > 139   < > 142 143 143 139 140  K 4.1   < > 3.7   < > 3.3*   < > 3.6 3.4* 3.6 3.9 4.0  CL 93*  --  94*  --  93*  --   --  97*  --   --  95*  CO2 39*  --  36*  --  37*  --   --  38*  --   --  32  GLUCOSE 86  --  100*  --  195*  --   --  93  --   --  221*  BUN 36*  --  26*  --  26*  --   --  23*  --   --  23*  CREATININE 0.81  --  0.62  --  0.60*  --   --  0.57*  --   --  0.72  CALCIUM 8.4*  --  8.4*  --  8.1*  --   --  8.2*  --   --  8.5*  MG  --   --   --   --  1.7  --   --  1.7  --   --  2.0  PHOS  --   --   --   --  4.9*  --   --  3.4  --   --  4.3   < > = values in this interval not displayed.   GFR: Estimated Creatinine Clearance: 114.1 mL/min (by C-G formula based on SCr of 0.72 mg/dL). Recent Labs  Lab 10/25/20 2358 10/25/20 2358 10/26/20 0622 10/26/20 1519 10/26/20 1629 10/27/20 0115  PROCALCITON 0.25  --   --   --   --   --   WBC 36.1*   < > 26.5* 14.5* 17.1* 14.7*   < > = values in this interval not displayed.    Liver Function Tests: Recent Labs  Lab 10/23/20 1823  AST 34  ALT 32  ALKPHOS 65  BILITOT 0.8  PROT 4.7*  ALBUMIN 2.5*   No results for input(s): LIPASE, AMYLASE in the last 168 hours. No results for input(s): AMMONIA in the last 168 hours.  ABG    Component Value Date/Time   PHART 7.335 (L) 10/27/2020 0059   PCO2ART 76.9 (HH) 10/27/2020 0059   PO2ART 76 (L) 10/27/2020 0059   HCO3 41.2 (H) 10/27/2020 0059   TCO2 43 (H) 10/27/2020 0059   O2SAT 93.0 10/27/2020 0059     Coagulation Profile: No results for input(s): INR, PROTIME in the last 168 hours.  Cardiac Enzymes: No results for input(s): CKTOTAL, CKMB, CKMBINDEX, TROPONINI in the last 168 hours.  HbA1C: Hgb A1c MFr Bld  Date/Time Value Ref Range Status  10/12/2020 05:54 AM 6.5 (H) 4.8 - 5.6 % Final  Comment:    (NOTE)         Prediabetes: 5.7 - 6.4         Diabetes: >6.4         Glycemic control  for adults with diabetes: <7.0     CBG: Recent Labs  Lab 10/26/20 1213 10/26/20 1637 10/26/20 1922 10/27/20 0027 10/27/20 0426  GLUCAP 88 146* 126* 158* 244*    Kenzey Birkland A, DO 10/27/2020, 7:28 AM

## 2020-10-27 NOTE — Plan of Care (Signed)

## 2020-10-27 NOTE — Progress Notes (Signed)
Pt was noted to brady down to asystole. Chest compressions were started and patient was suctioned using lavage. Chest compressions lasted less than 1 minute. No medications were administered. EKG, troponin and CXR were ordered and obtained (see medical record).

## 2020-10-27 NOTE — Progress Notes (Signed)
eLink Physician-Brief Progress Note Patient Name: Lawrence Freeman DOB: Aug 15, 1971 MRN: [id number]   Date of Service  10/27/2020  HPI/Events of Note  Patient having sinus tachycardia with intermittent SVT to ventricular rate of 190.  eICU Interventions  Plan: 1. Amiodarone IV load and infusion. 2. Metoprolol 2.5 mg IV Q 3 hours PRN HR > 115. Hold dose for SBP < 110.  3. BMP and Mg++ level STAT.     Intervention Category Major Interventions: Arrhythmia - evaluation and management  [name] 10/27/2020, 1:31 AM

## 2020-10-27 NOTE — TOC Initial Note (Signed)
Transition of Care [facility]) - Initial/Assessment Note    Patient Details  Name: Lawrence Freeman MRN: [id number] Date of Birth: 1971-03-09  Transition of Care [facility]) CM/SW Contact:    Epifanio Lesches, RN Phone Number: 10/27/2020, 11:34 AM  Clinical Narrative:        Admitted from [facility] after tracheostomy          Per MD pt will be ready to d/c back to [facility] on today once trach has been switched. NCM received call from [facility]/ Victorino Dike informing NCM pt will need approval from insurance before returning to [place], NCM made nurse and MD aware. Call made to wife to discuss discharge planning. Call unsucessful unable to leave voice message. TOC team will continue to monitor and follow ....  Expected Discharge Plan: Long Term Acute Care (LTAC) Barriers to Discharge: Continued Medical Work up, English as a second language teacher   Patient Goals and CMS Choice        Expected Discharge Plan and Services Expected Discharge Plan: Long Term Acute Care (LTAC)                                              Prior Living Arrangements/Services                       Activities of Daily Living Home Assistive Devices/Equipment: None ADL Screening (condition at time of admission) Patient's cognitive ability adequate to safely complete daily activities?: No Is the patient deaf or have difficulty hearing?: No Does the patient have difficulty seeing, even when wearing glasses/contacts?: No Does the patient have difficulty concentrating, remembering, or making decisions?: No Patient able to express need for assistance with ADLs?: No Does the patient have difficulty dressing or bathing?: No Independently performs ADLs?: No Communication: Dependent Is this a change from baseline?: Pre-admission baseline Dressing (OT): Dependent Is this a change from baseline?: Pre-admission baseline Grooming: Dependent Is this a change from baseline?: Pre-admission baseline Bathing:  Dependent Is this a change from baseline?: Pre-admission baseline Toileting: Dependent Is this a change from baseline?: Pre-admission baseline In/Out Bed: Dependent Is this a change from baseline?: Pre-admission baseline Walks in Home: Dependent Is this a change from baseline?: Pre-admission baseline Does the patient have difficulty walking or climbing stairs?: Yes Weakness of Legs: Both Weakness of Arms/Hands: Both  Permission Sought/Granted                  Emotional Assessment              Admission diagnosis:  Pneumothorax [J93.9] Patient Active Problem List   Diagnosis Date Noted  . Malnutrition of moderate degree 10/27/2020  . Pressure injury of skin 10/26/2020  . Chest tube in place   . Pneumothorax on right 10/25/2020  . Acute on chronic respiratory failure with hypoxia (HCC)   . COVID-19 virus infection   . Acute infective tracheobronchitis   . Pneumonia due to COVID-19 virus   . Tracheostomy status (HCC)    PCP:  Gunnar Bulla, MD Pharmacy:   [facility] - [place], [place] - [address] [place] [address] Phone: [phone] Fax: [phone]     Social Determinants of Health (SDOH) Interventions    Readmission Risk Interventions No flowsheet data found.

## 2020-10-28 ENCOUNTER — Inpatient Hospital Stay (HOSPITAL_COMMUNITY): Payer: BC Managed Care – PPO

## 2020-10-28 ENCOUNTER — Encounter (HOSPITAL_COMMUNITY): Payer: Self-pay | Admitting: Internal Medicine

## 2020-10-28 ENCOUNTER — Other Ambulatory Visit (HOSPITAL_COMMUNITY): Payer: BC Managed Care – PPO

## 2020-10-28 DIAGNOSIS — J9621 Acute and chronic respiratory failure with hypoxia: Secondary | ICD-10-CM

## 2020-10-28 DIAGNOSIS — R011 Cardiac murmur, unspecified: Secondary | ICD-10-CM

## 2020-10-28 DIAGNOSIS — Z930 Tracheostomy status: Secondary | ICD-10-CM | POA: Diagnosis not present

## 2020-10-28 DIAGNOSIS — J1282 Pneumonia due to coronavirus disease 2019: Secondary | ICD-10-CM

## 2020-10-28 DIAGNOSIS — J209 Acute bronchitis, unspecified: Secondary | ICD-10-CM | POA: Diagnosis not present

## 2020-10-28 DIAGNOSIS — U071 COVID-19: Secondary | ICD-10-CM | POA: Diagnosis not present

## 2020-10-28 DIAGNOSIS — J9620 Acute and chronic respiratory failure, unspecified whether with hypoxia or hypercapnia: Secondary | ICD-10-CM

## 2020-10-28 LAB — CBC
HCT: 24.1 % — ABNORMAL LOW (ref 39.0–52.0)
Hemoglobin: 7.6 g/dL — ABNORMAL LOW (ref 13.0–17.0)
MCH: 30.5 pg (ref 26.0–34.0)
MCHC: 31.5 g/dL (ref 30.0–36.0)
MCV: 96.8 fL (ref 80.0–100.0)
Platelets: 183 10*3/uL (ref 150–400)
RBC: 2.49 MIL/uL — ABNORMAL LOW (ref 4.22–5.81)
RDW: 18.6 % — ABNORMAL HIGH (ref 11.5–15.5)
WBC: 9.2 10*3/uL (ref 4.0–10.5)
nRBC: 0 % (ref 0.0–0.2)

## 2020-10-28 LAB — BASIC METABOLIC PANEL
Anion gap: 10 (ref 5–15)
BUN: 38 mg/dL — ABNORMAL HIGH (ref 6–20)
CO2: 36 mmol/L — ABNORMAL HIGH (ref 22–32)
Calcium: 8.5 mg/dL — ABNORMAL LOW (ref 8.9–10.3)
Chloride: 95 mmol/L — ABNORMAL LOW (ref 98–111)
Creatinine, Ser: 0.61 mg/dL (ref 0.61–1.24)
GFR, Estimated: >60 mL/min (ref >60)
Glucose, Bld: 306 mg/dL — ABNORMAL HIGH (ref 70–99)
Potassium: 4.6 mmol/L (ref 3.5–5.1)
Sodium: 141 mmol/L (ref 135–145)

## 2020-10-28 LAB — ECHOCARDIOGRAM COMPLETE
Height: 70 in
S' Lateral: 2.4 cm
Weight: 2486.79 oz

## 2020-10-28 LAB — CULTURE, RESPIRATORY W GRAM STAIN: Culture: NORMAL

## 2020-10-28 LAB — IRON AND TIBC
Iron: 55 ug/dL (ref 45–182)
Saturation Ratios: 37 % (ref 17.9–39.5)
TIBC: 148 ug/dL — ABNORMAL LOW (ref 250–450)
UIBC: 93 ug/dL

## 2020-10-28 LAB — FERRITIN: Ferritin: 1026 ng/mL — ABNORMAL HIGH (ref 24–336)

## 2020-10-28 LAB — PHOSPHORUS: Phosphorus: 2.7 mg/dL (ref 2.5–4.6)

## 2020-10-28 LAB — GLUCOSE, CAPILLARY
Glucose-Capillary: 206 mg/dL — ABNORMAL HIGH (ref 70–99)
Glucose-Capillary: 206 mg/dL — ABNORMAL HIGH (ref 70–99)
Glucose-Capillary: 209 mg/dL — ABNORMAL HIGH (ref 70–99)
Glucose-Capillary: 212 mg/dL — ABNORMAL HIGH (ref 70–99)
Glucose-Capillary: 274 mg/dL — ABNORMAL HIGH (ref 70–99)
Glucose-Capillary: 290 mg/dL — ABNORMAL HIGH (ref 70–99)
Glucose-Capillary: 309 mg/dL — ABNORMAL HIGH (ref 70–99)

## 2020-10-28 LAB — MAGNESIUM: Magnesium: 1.9 mg/dL (ref 1.7–2.4)

## 2020-10-28 LAB — VITAMIN B12: Vitamin B-12: 2413 pg/mL — ABNORMAL HIGH (ref 180–914)

## 2020-10-28 LAB — TROPONIN I (HIGH SENSITIVITY): Troponin I (High Sensitivity): 32 ng/L — ABNORMAL HIGH (ref <18)

## 2020-10-28 MED ORDER — LISINOPRIL 10 MG PO TABS
10.0000 mg | ORAL_TABLET | Freq: Every day | ORAL | Status: DC
  Administered 2020-10-29 – 2020-10-30 (×2): 10 mg
  Filled 2020-10-28 (×2): qty 1

## 2020-10-28 MED ORDER — PREDNISONE 5 MG/ML PO CONC: 15.0000 mg | Freq: Every day | ORAL | Status: DC

## 2020-10-28 MED ORDER — OXYCODONE HCL 5 MG PO TABS
5.0000 mg | ORAL_TABLET | Freq: Four times a day (QID) | ORAL | Status: DC
  Administered 2020-10-28: 5 mg
  Filled 2020-10-28: qty 1

## 2020-10-28 MED ORDER — NUTRISOURCE FIBER PO PACK
1.0000 | PACK | Freq: Two times a day (BID) | ORAL | Status: DC
  Administered 2020-10-28 – 2020-11-01 (×8): 1
  Filled 2020-10-28 (×9): qty 1

## 2020-10-28 MED ORDER — ACETAMINOPHEN 160 MG/5ML PO SOLN: 650.0000 mg | Freq: Four times a day (QID) | ORAL | Status: DC | PRN

## 2020-10-28 MED ORDER — LISINOPRIL 5 MG PO TABS
5.0000 mg | ORAL_TABLET | Freq: Every day | ORAL | Status: DC
  Administered 2020-10-28: 5 mg
  Filled 2020-10-28: qty 1

## 2020-10-28 MED ORDER — OXYCODONE HCL 5 MG PO TABS
10.0000 mg | ORAL_TABLET | Freq: Four times a day (QID) | ORAL | Status: DC
  Administered 2020-10-28 – 2020-10-31 (×11): 10 mg
  Filled 2020-10-28 (×12): qty 2

## 2020-10-28 MED ORDER — PREDNISONE 5 MG/ML PO CONC
30.0000 mg | Freq: Every day | ORAL | Status: DC
  Administered 2020-10-29 – 2020-11-01 (×4): 30 mg
  Filled 2020-10-28 (×5): qty 6

## 2020-10-28 MED ORDER — OXYCODONE HCL 5 MG PO TABS: 10.0000 mg | ORAL_TABLET | Freq: Four times a day (QID) | ORAL | Status: DC

## 2020-10-28 MED ORDER — FUROSEMIDE 10 MG/ML IJ SOLN
40.0000 mg | Freq: Once | INTRAMUSCULAR | Status: AC
  Administered 2020-10-28: 40 mg via INTRAVENOUS
  Filled 2020-10-28: qty 4

## 2020-10-28 MED ORDER — LIDOCAINE 5 % EX PTCH
1.0000 | MEDICATED_PATCH | CUTANEOUS | Status: DC
  Administered 2020-10-28 – 2020-11-01 (×3): 1 via TRANSDERMAL
  Filled 2020-10-28 (×5): qty 1

## 2020-10-28 MED ORDER — OXYCODONE HCL 5 MG PO TABS: 5.0000 mg | ORAL_TABLET | Freq: Four times a day (QID) | ORAL | Status: DC

## 2020-10-28 NOTE — Progress Notes (Signed)
Inpatient Diabetes Program Recommendations  AACE/ADA: New Consensus Statement on Inpatient Glycemic Control (2015)  Target Ranges:  Prepandial:   less than 140 mg/dL      Peak postprandial:   less than 180 mg/dL (1-2 hours)      Critically ill patients:  140 - 180 mg/dL   Lab Results  Component Value Date   GLUCAP 206 (H) 10/28/2020   HGBA1C 6.5 (H) 10/12/2020    Review of Glycemic Control Results for DRAVYN, SEVERS (MRN [id number]) as of 10/28/2020 15:30  Ref. Range 10/27/2020 20:37 10/28/2020 00:02 10/28/2020 04:20 10/28/2020 07:59 10/28/2020 12:10  Glucose-Capillary Latest Ref Range: 70 - 99 mg/dL 505 (H) 697 (H) 948 (H) 212 (H) 206 (H)   Diabetes history: None  Current orders for Inpatient glycemic control:  Lantus 10 units bid Novolog resistant q 4 hours Prednisone 30 mg daily (titrating down 11/03/20 to Prednisone 15 mg).  Inpatient Diabetes Program Recommendations:    Blood sugars>goal.  Consider adding Novolog 2 units q 4 hours for tube feed coverage.   Thanks,  Beryl Meager, RN, BC-ADM Inpatient Diabetes Coordinator Pager [phone] (8a-5p)

## 2020-10-28 NOTE — Progress Notes (Signed)
Notified Dr Drucie Ip that pt was tachypneic, hypertensive, and tachycardic.  Unresponsive to 50 mg IV boluses X 2.  No new orders at this time. MD said that patient just needs to work through it.

## 2020-10-28 NOTE — Consult Note (Signed)
Patient known to me from [facility] as he had problems when his trach was being changed and I was consulted emergently to replace his trach. I was able to place a Bovina 110 mm # 6 trach  Recent CXR revealed this to be close to the right mainstem bronchi just above the carina It has been pulled back about 2 cm and on exam has about 2.5 cmm length of trach pulled back. FOL through the trach reveals it to be about 2-3 cm above the carina in good position. Patient is being ventilated well on 40 % O2 and sats of 95

## 2020-10-28 NOTE — Progress Notes (Signed)
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  History of present illness   History per below and confirmed with wife at bedside:  "Lawrence Freeman an 89 y.o.malewith medical history significant of coronary disease, hypertension, hyperlipidemia, gout who was admitted to the acute facility after transfer from [facility] with COVID-19 pneumonia and pneumomediastinum. Apparently symptoms started on 08/09/2020, he tested positive for Covid on 08/15/2020. He was treated with remdesivir/dexamethasone and placed on high flow nasal cannula with subsequent pneumomediastinum. On 08/31/2019 when he was transferred to [facility] with initial improvement, weaned from BiPAP to high HFNC and then transferred out of the ICU. However, 09/05/2020 patient decompensated and was intubated. Janina Mayo was done on 09/20/2020. His hospital stay was complicated by ARDS, multiple infections."  Patient heavily sedated at time of evaluation. Was improving with intermittent PS trials. Today patient was to be downsized from size 9 to 6 cuffed shiley to facilitate weaning. Unforunately they were unable to pass new tracheostomy into stoma. Anesthesia called, multiple failed attempts at intubation.  >1 hour bagging. ENT called, placed 6-0 bivona into airway. Post these events, CXR showing large R PTX, extensive subcutaneous emphysema. Due to ER bed issues, patient to be direct admitted to ICU for management of unstable airway and pneumothorax.  Past Medical History  HTN HLD  Significant Hospital Events   08/15/20 +COVID 09/05/20 intubated  at [facility] 09/20/20 tracheostomy at [facility] 10/19/20 PEG by IR 10/25/20 trach exchange, prolonged bagging, [facility] 11/2 large bore chest tube R   Consults:  ID, cardiology  Procedures:  Trach exchange by ENT 10/25/20 11/2 R chest tube  Significant Diagnostic Tests:  11/1 CXR:>> extensive subcutaneous emphysema, R PTX 11/3 CXR>>no visible pneumothorax, continued pneumomediastinum and subc emphysema with patchy bilateral airspace opacities 11/4 CXR >> No PTX 11/5 CXR >> trach tube just above right mainstem, increased diffuse patchiness compared to prior   Micro Data:  10/24/20 blood cx neg  Antimicrobials:  Complicated allergy history per Dr. Raymondo Band note (10/13/20)  He has finished course for MRSA PNA with vanc He was previously on vanc/inh tobra and cipro prior to transfer from [facility]  Interim history/subjective:  Went into very short PEA arrest yesterday evening with <1 minute CPR. CXR this morning shows trach dislodged and partially in the right mainstem. Saturations currently stable.  Awake, following commands and more interactive than yesterday. Has right sided lower rib pain likely from chest compressions. Denies other pain or abdominal pain or nausea. No shortness of breath.   Objective   Blood pressure 130/81, pulse 75, temperature 97.9 F (36.6 C), temperature source Oral, resp. rate (!) 34, height 5\' 10"  (1.778 m), weight 70.5 kg, SpO2 100 %.    Vent Mode: PCV FiO2 (%):  [40 %-100 %] 60 % Set Rate:  [33 bmp] 33 bmp PEEP:  [5 cmH20] 5 cmH20 Plateau Pressure:  [22 cmH20-31 cmH20] 30 cmH20   Intake/Output Summary (Last 24 hours) at 10/28/2020 0709 Last data filed at 10/28/2020 0700 Gross per 24 hour  Intake 2210.58 ml  Output 2470 ml  Net -259.42 ml   Filed Weights   10/26/20 0500  10/27/20 0500 10/28/20 0300  Weight: 75.3 kg 74.3 kg 70.5 kg    General:  Thin, chronically ill appearing M, awake, following commands HEENT: MM pink/moist,  trach in place without bleeding Neuro: awake, following commands, answering yes or no questions  CV: s1s2 rrr, IV/VI systolic and diastolic murmur  PULM:  On full vent support, no bleeding from trach, course breath sounds bilaterally GI: soft, bsx4 active  Extremities: warm/dry, edema  Skin: no rashes or lesions   Resolved Hospital Problem list   Pneumothorax   Assessment & Plan:   Acute on chronic hypoxemic respiratory failure with iatrogenic  Large R pneumothorax After prolonged bagging during difficulty changing trach and poor ventilation, Initial insult COVID 19 dx'd 07/2021, complicated by HCAP and post-COVID fibrosis requiring trach and LTAC. Resolution of pneumothorax with no leak of chest tube. CXR shows trach tube slightly toward right mainstem after short episode of PEA arrest yesterday evening.  P: - trach management per ENT with history of complications. Trach adjusted by Dr. Ezzard Standing under scope, appreciate assistance   - R chest tube in place with resolution of Pneumo on repeat CXR, place to water seal today  - consult to [facility] with possible transfer back to [facility] pending insurance auth  PEA Arrest CAD, tachy-brady issues, history 3-vessel disease with 2 stents placed 05/2019 Sinus Tach w/intermittent SVT HTN On Effient, bradycardic while on Precedex. On Clonidine and metoprolol 25 mg bid at [facility]. Ep of sinus tachycardia overnight, started on amiodarone. Amio stopped and home metoprolol restarted but patient subsequently had pauses overnight which progressed to PEA arrest with ROSC achieved in <1 minute, possible very long pause. Prior episodes of bradycardia thought to be vasovagal secondary to rectal tube.   P: -Cardiology previously consulted at [facility] -hold Effient and heparin -metoprolol stopped  - clonidine stopped at admission due to requiring pressor support on admission - increase lisinopril to 10 mg qd - increase fiber with goal to d/c rectal tube  Need for  sedation for prolonged mechanical ventilation, benzodiazepine dependence, iatrogenic Alert and oriented today. Apparently was on analgesic/sedative drip at [facility] from talking to daughter. Having difficulty weaning here.  P: - Work toward oral agents:, seroquel, gabapentin, zoloft, clonazepam - oxycodone increased to 10 mg qid  - Wean precedex and fentanyl as able.    Anemia of Chronic Disease Baseline ~8 since admission, prior hemoglobin within normal limits in September, labs consistent with anemia of chronic disease. s/p 1U for hgb 6.5, now stable.  P: - trend CBC - heparin restarted yesterday  - restart prasugrel tomorrow if hemoglobin stable.   Presumed cefepime-induced allergic reaction 10/21  on steroids (or were steroids for COPD?) unclear. P: - on steroid taper   Klebsiella PNA On tracheal aspirate culture done at [facility] and treated with Azactam though had worsening fever and leukocytosis, treated with Cefepime at [facility] then switched to Cipro as well as fluconazone, seen by ID 10/29, cultures in Epic negative since October.  P: -  Trach aspirate 11/3 with no growth, Pct 0.25 11/02 - Hold abx for now, monitor for fever, worsening respiratory infiltrates  DM worsened with steroids on lantus 20 units BID PTA P: - 10 units BID lantus, resistant SSI - Weaning steroids  Small SDH  per records at OSH, was on prasurgel at [facility], moving all 4 ext, monitor neuro status intermittently. Moving extremities, answering questions 11/4.  P: -CT head for acute neuro changes  Stage II Pressure Ulcer WC consulted.   Best  practice:  Diet: NPO, on TF Pain/Anxiety/Delirium protocol (if indicated): Fentanyl, versed VAP protocol (if indicated): in place DVT prophylaxis: on hold for bleeding requiring transfusion, SCDs GI prophylaxis: PPI Glucose control: SSI Mobility: BR Code Status: full Family Communication: daughter updated at bedside Disposition: likely transition back  to [facility] today    Labs   CBC: Recent Labs  Lab 10/26/20 0622 10/26/20 0937 10/26/20 1519 10/26/20 1629 10/27/20 0059 10/27/20 0115 10/28/20 0023  WBC 26.5*  --  14.5* 17.1*  --  14.7* 9.2  HGB 6.4*   < > 6.6* 8.0* 8.2* 7.7* 7.6*  HCT 20.0*   < > 20.9* 25.0* 24.0* 24.6* 24.1*  MCV 100.0  --  96.8 97.3  --  99.6 96.8  PLT 244  --  198 231  --  195 183   < > = values in this interval not displayed.    Basic Metabolic Panel: Recent Labs  Lab 10/24/20 0553 10/24/20 0553 10/25/20 2358 10/26/20 0528 10/26/20 0622 10/26/20 4709 10/27/20 0059 10/27/20 0115 10/28/20 0023  NA 140   < > 139   < > 143 143 139 140 141  K 3.7   < > 3.3*   < > 3.4* 3.6 3.9 4.0 4.6  CL 94*  --  93*  --  97*  --   --  95* 95*  CO2 36*  --  37*  --  38*  --   --  32 36*  GLUCOSE 100*  --  195*  --  93  --   --  221* 306*  BUN 26*  --  26*  --  23*  --   --  23* 38*  CREATININE 0.62  --  0.60*  --  0.57*  --   --  0.72 0.61  CALCIUM 8.4*  --  8.1*  --  8.2*  --   --  8.5* 8.5*  MG  --   --  1.7  --  1.7  --   --  2.0 1.9  PHOS  --   --  4.9*  --  3.4  --   --  4.3 2.7   < > = values in this interval not displayed.   GFR: Estimated Creatinine Clearance: 110.2 mL/min (by C-G formula based on SCr of 0.61 mg/dL). Recent Labs  Lab 10/25/20 2358 10/26/20 0622 10/26/20 1519 10/26/20 1629 10/27/20 0115 10/28/20 0023  PROCALCITON 0.25  --   --   --   --   --   WBC 36.1*   < > 14.5* 17.1* 14.7* 9.2   < > = values in this interval not displayed.    Liver Function Tests: Recent Labs  Lab 10/23/20 1823  AST 34  ALT 32  ALKPHOS 65  BILITOT 0.8  PROT 4.7*  ALBUMIN 2.5*   No results for input(s): LIPASE, AMYLASE in the last 168 hours. No results for input(s): AMMONIA in the last 168 hours.  ABG    Component Value Date/Time   PHART 7.335 (L) 10/27/2020 0059   PCO2ART 76.9 (HH) 10/27/2020 0059   PO2ART 76 (L) 10/27/2020 0059   HCO3 41.2 (H) 10/27/2020 0059   TCO2 43 (H) 10/27/2020 0059    O2SAT 93.0 10/27/2020 0059     Coagulation Profile: No results for input(s): INR, PROTIME in the last 168 hours.  Cardiac Enzymes: No results for input(s): CKTOTAL, CKMB, CKMBINDEX, TROPONINI in the last 168 hours.  HbA1C: Hgb A1c MFr Bld  Date/Time Value Ref Range  Status  10/12/2020 05:54 AM 6.5 (H) 4.8 - 5.6 % Final    Comment:    (NOTE)         Prediabetes: 5.7 - 6.4         Diabetes: >6.4         Glycemic control for adults with diabetes: <7.0     CBG: Recent Labs  Lab 10/27/20 1159 10/27/20 1615 10/27/20 2037 10/28/20 0002 10/28/20 0420  GLUCAP 173* 187* 219* 290* 209*    Ellisa Devivo A, DO 10/28/2020, 7:09 AM

## 2020-10-28 NOTE — Plan of Care (Signed)
  Problem: Education: Goal: Knowledge of General Education information will improve Description: Including pain rating scale, medication(s)/side effects and non-pharmacologic comfort measures Outcome: Progressing   Problem: Clinical Measurements: Goal: Ability to maintain clinical measurements within normal limits will improve Outcome: Progressing Goal: Will remain free from infection Outcome: Progressing Goal: Diagnostic test results will improve Outcome: Progressing   Problem: Activity: Goal: Risk for activity intolerance will decrease Outcome: Progressing   Problem: Nutrition: Goal: Adequate nutrition will be maintained Outcome: Progressing   Problem: Coping: Goal: Level of anxiety will decrease Outcome: Progressing   Problem: Elimination: Goal: Will not experience complications related to bowel motility Outcome: Progressing Goal: Will not experience complications related to urinary retention Outcome: Progressing   Problem: Pain Managment: Goal: General experience of comfort will improve Outcome: Progressing   Problem: Safety: Goal: Ability to remain free from injury will improve Outcome: Progressing   Problem: Skin Integrity: Goal: Risk for impaired skin integrity will decrease Outcome: Progressing   

## 2020-10-28 NOTE — Progress Notes (Signed)
Notified Tessie Fass, NP that pt was tachypneic, tachycardic, and hypertensive despite PRN medications.  Lungs sound wet and coarse on assessment.

## 2020-10-28 NOTE — Progress Notes (Signed)
Echocardiogram 2D Echocardiogram has been performed.  Warren Lacy Noella Kipnis 10/28/2020, 3:46 PM

## 2020-10-29 ENCOUNTER — Encounter (HOSPITAL_COMMUNITY): Payer: Self-pay | Admitting: Internal Medicine

## 2020-10-29 ENCOUNTER — Inpatient Hospital Stay (HOSPITAL_COMMUNITY): Payer: BC Managed Care – PPO

## 2020-10-29 DIAGNOSIS — J209 Acute bronchitis, unspecified: Secondary | ICD-10-CM | POA: Diagnosis not present

## 2020-10-29 DIAGNOSIS — U071 COVID-19: Secondary | ICD-10-CM | POA: Diagnosis not present

## 2020-10-29 DIAGNOSIS — J9621 Acute and chronic respiratory failure with hypoxia: Secondary | ICD-10-CM | POA: Diagnosis not present

## 2020-10-29 DIAGNOSIS — Z930 Tracheostomy status: Secondary | ICD-10-CM | POA: Diagnosis not present

## 2020-10-29 LAB — CULTURE, BLOOD (ROUTINE X 2)
Culture: NO GROWTH
Culture: NO GROWTH

## 2020-10-29 LAB — BASIC METABOLIC PANEL
Anion gap: 9 (ref 5–15)
BUN: 37 mg/dL — ABNORMAL HIGH (ref 6–20)
CO2: 41 mmol/L — ABNORMAL HIGH (ref 22–32)
Calcium: 8.2 mg/dL — ABNORMAL LOW (ref 8.9–10.3)
Chloride: 92 mmol/L — ABNORMAL LOW (ref 98–111)
Creatinine, Ser: 0.54 mg/dL — ABNORMAL LOW (ref 0.61–1.24)
GFR, Estimated: >60 mL/min (ref >60)
Glucose, Bld: 194 mg/dL — ABNORMAL HIGH (ref 70–99)
Potassium: 3.7 mmol/L (ref 3.5–5.1)
Sodium: 142 mmol/L (ref 135–145)

## 2020-10-29 LAB — CBC
HCT: 26.1 % — ABNORMAL LOW (ref 39.0–52.0)
Hemoglobin: 8.1 g/dL — ABNORMAL LOW (ref 13.0–17.0)
MCH: 30.7 pg (ref 26.0–34.0)
MCHC: 31 g/dL (ref 30.0–36.0)
MCV: 98.9 fL (ref 80.0–100.0)
Platelets: 175 10*3/uL (ref 150–400)
RBC: 2.64 MIL/uL — ABNORMAL LOW (ref 4.22–5.81)
RDW: 17.9 % — ABNORMAL HIGH (ref 11.5–15.5)
WBC: 6.9 10*3/uL (ref 4.0–10.5)
nRBC: 0 % (ref 0.0–0.2)

## 2020-10-29 LAB — GLUCOSE, CAPILLARY
Glucose-Capillary: 152 mg/dL — ABNORMAL HIGH (ref 70–99)
Glucose-Capillary: 227 mg/dL — ABNORMAL HIGH (ref 70–99)
Glucose-Capillary: 283 mg/dL — ABNORMAL HIGH (ref 70–99)
Glucose-Capillary: 230 mg/dL — ABNORMAL HIGH (ref 70–99)
Glucose-Capillary: 229 mg/dL — ABNORMAL HIGH (ref 70–99)
Glucose-Capillary: 210 mg/dL — ABNORMAL HIGH (ref 70–99)

## 2020-10-29 LAB — MAGNESIUM: Magnesium: 1.9 mg/dL (ref 1.7–2.4)

## 2020-10-29 LAB — PHOSPHORUS: Phosphorus: 2.4 mg/dL — ABNORMAL LOW (ref 2.5–4.6)

## 2020-10-29 MED ORDER — TESTOSTERONE 50 MG/5GM (1%) TD GEL
5.0000 g | Freq: Every day | TRANSDERMAL | Status: DC
  Administered 2020-10-29 – 2020-11-01 (×4): 5 g via TRANSDERMAL
  Filled 2020-10-29 (×4): qty 5

## 2020-10-29 MED ORDER — PRASUGREL HCL 10 MG PO TABS
10.0000 mg | ORAL_TABLET | Freq: Every day | ORAL | Status: DC
  Administered 2020-10-29 – 2020-10-30 (×2): 10 mg via ORAL
  Filled 2020-10-29 (×2): qty 1

## 2020-10-29 NOTE — Plan of Care (Signed)
  Problem: Clinical Measurements: Goal: Ability to maintain clinical measurements within normal limits will improve Outcome: Progressing Goal: Will remain free from infection Outcome: Progressing Goal: Diagnostic test results will improve Outcome: Progressing Goal: Cardiovascular complication will be avoided Outcome: Progressing   Problem: Activity: Goal: Risk for activity intolerance will decrease Outcome: Progressing   Problem: Nutrition: Goal: Adequate nutrition will be maintained Outcome: Progressing   Problem: Coping: Goal: Level of anxiety will decrease Outcome: Progressing   Problem: Elimination: Goal: Will not experience complications related to bowel motility Outcome: Progressing Goal: Will not experience complications related to urinary retention Outcome: Progressing   Problem: Pain Managment: Goal: General experience of comfort will improve Outcome: Progressing   Problem: Safety: Goal: Ability to remain free from injury will improve Outcome: Progressing   Problem: Skin Integrity: Goal: Risk for impaired skin integrity will decrease Outcome: Progressing   

## 2020-10-29 NOTE — Progress Notes (Signed)
Patient was noted to have excessive secretions coming from trach stoma and  an airleak was noted. In addition patient breath sounds were very faint and oxygen saturations were decreased. RT was notified and proceeded to bedside. RT removed normal saline from cuff and added 89ml of normal saline to cuff resolving the leak and increasing breath sounds and oxygen saturations.

## 2020-10-29 NOTE — Progress Notes (Signed)
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  History of present illness   History per below and confirmed with wife at bedside:  "Lawrence Andreoni Hensleyis an 49 y.o.malewith medical history significant of coronary disease, hypertension, hyperlipidemia, gout who was admitted to the acute facility after transfer from [facility] with COVID-19 pneumonia and pneumomediastinum. Apparently symptoms started on 08/09/2020, he tested positive for Covid on 08/15/2020. He was treated with remdesivir/dexamethasone and placed on high flow nasal cannula with subsequent pneumomediastinum. On 08/31/2019 when he was transferred to [facility] with initial improvement, weaned from BiPAP to high HFNC and then transferred out of the ICU. However, 09/05/2020 patient decompensated and was intubated. Janina Mayo was done on 09/20/2020. His hospital stay was complicated by ARDS, multiple infections."  Patient heavily sedated at time of evaluation. Was improving with intermittent PS trials. Today patient was to be downsized from size 9 to 6 cuffed shiley to facilitate weaning. Unforunately they were unable to pass new tracheostomy into stoma. Anesthesia called, multiple failed attempts at intubation.  >1 hour bagging. ENT called, placed 6-0 bivona into airway. Post these events, CXR showing large R PTX, extensive subcutaneous emphysema. Due to ER bed issues, patient to be direct admitted to ICU for management of unstable airway and pneumothorax.  Past Medical History  HTN HLD  Significant Hospital Events   08/15/20 +COVID 09/05/20 intubated  at [facility] 09/20/20 tracheostomy at [facility] 10/19/20 PEG by IR 10/25/20 trach exchange, prolonged bagging, [facility] 11/2 large bore chest tube R   Consults:  ID, cardiology  Procedures:  Trach exchange by ENT 10/25/20 11/2 R chest tube  Significant Diagnostic Tests:  11/1 CXR:>> extensive subcutaneous emphysema, R PTX 11/3 CXR>>no visible pneumothorax, continued pneumomediastinum and subc emphysema with patchy bilateral airspace opacities 11/4 CXR >> No PTX 11/5 CXR >> trach tube just above right mainstem, increased diffuse patchiness compared to prior   Micro Data:  10/24/20 blood cx neg  Antimicrobials:  Complicated allergy history per Dr. Raymondo Band note (10/13/20)  He has finished course for MRSA PNA with vanc He was previously on vanc/inh tobra and cipro prior to transfer from [facility]  Interim history/subjective:   Cuff leak overnight with resolution with placement of saline. Episodes of tachycardia and bradycardia and agitation overnight. This morning resting.   Objective   Blood pressure (!) 174/81, pulse 100, temperature 98.8 F (37.1 C), temperature source Oral, resp. rate (!) 30, height 5\' 10"  (1.778 m), weight 67.3 kg, SpO2 93 %.    Vent Mode: PCV FiO2 (%):  [40 %-60 %] 40 % Set Rate:  [33 bmp] 33 bmp PEEP:  [5 cmH20] 5 cmH20 Plateau Pressure:  [20 cmH20-26 cmH20] 22 cmH20   Intake/Output Summary (Last 24 hours) at 10/29/2020 0643 Last data filed at 10/29/2020 0600 Gross per 24 hour  Intake 2726.74 ml  Output 5000 ml  Net -2273.26 ml   Filed Weights   10/27/20 0500 10/28/20 0300 10/29/20 0357  Weight: 74.3 kg 70.5 kg 67.3 kg   General:  Thin, chronically ill appearing M, awake, tired appearing, following commands HEENT: MM pink/moist, trach in place without bleeding but with  increased secretions Neuro: awake, following commands, answering yes or no questions  CV: s1s2 rrr, IV/VI systolic and diastolic murmur  PULM:  On full vent  support, clear breath sounds bilaterally GI: soft, bsx4 active  Extremities: warm/dry, edema  Skin: no rashes or lesions   Resolved Hospital Problem list   Pneumothorax   Assessment & Plan:   Acute on chronic hypoxemic respiratory failure with iatrogenic  Large R pneumothorax After prolonged bagging during difficulty changing trach and poor ventilation, Initial insult COVID 19 dx'd 07/2021, complicated by HCAP and post-COVID fibrosis requiring trach and LTAC. Resolution of pneumothorax with no leak of chest tube. CXR shows trach tube slightly toward right mainstem after short episode of PEA arrest yesterday evening.  P: - trach management per ENT with history of complications. Trach adjusted by Dr. Ezzard Standing under scope yesterday, appreciate assistance   - no PTX after repeat CXR today, water seal yest, remove chest tube   - consult to [facility] with possible transfer back to [facility] pending insurance auth  PEA Arrest CAD, tachy-brady issues, history 3-vessel disease with 2 stents placed 05/2019 Sinus Tach w/intermittent SVT HTN On Effient, bradycardic while on Precedex. On Clonidine and metoprolol 25 mg bid at [facility]. Intermittent sinus tach & bradycardia with one episodes of long pause vs. PEA arrest. Prior episodes at [facility] of bradycardia thought to be vasovagal secondary to rectal tube. Rectal tube now removed.  P: -Cardiology previously consulted at [facility] -hold Effient, cont. Heparin  -metoprolol stopped  - clonidine stopped at admission due to requiring pressor support on admission - increase lisinopril to 10 mg qd  Need for sedation for prolonged mechanical ventilation, benzodiazepine dependence, iatrogenic Apparently was on analgesic/sedative drip at [facility] from talking to daughter. Having difficulty weaning here.  P: - Work toward oral agents:, seroquel, gabapentin, zoloft, clonazepam - oxycodone increased yest to 10 mg qid  - Wean precedex and fentanyl as able.    Anemia of  Chronic Disease Baseline ~8 since admission, prior hemoglobin within normal limits in September, labs consistent with anemia of chronic disease. s/p 1U for hgb 6.5, now stable.  P: - trend CBC - cont heparin   - restart prasugrel  Presumed cefepime-induced allergic reaction 10/21  on steroids (or were steroids for COPD?) unclear. P: - on steroid taper   Klebsiella PNA On tracheal aspirate culture done at [facility] and treated with Azactam though had worsening fever and leukocytosis, treated with Cefepime at [facility] then switched to Cipro as well as fluconazone, seen by ID 10/29, cultures in Epic negative since October.  P: -  Trach aspirate 11/3 with no growth, Pct 0.25 11/02 - Hold abx for now, monitor for fever, worsening respiratory infiltrates  DM worsened with steroids on lantus 20 units BID PTA P: - 10 units BID lantus, resistant SSI - Weaning steroids  Small SDH  per records at OSH, was on prasurgel at [facility], moving all 4 ext, monitor neuro status intermittently. Moving extremities, answering questions 11/4.  P: -CT head for acute neuro changes  Stage II Pressure Ulcer WC consulted.   Best practice:  Diet: NPO, on TF Pain/Anxiety/Delirium protocol (if indicated): Fentanyl, versed VAP protocol (if indicated): in place DVT prophylaxis: heparin GI prophylaxis: PPI Glucose control: SSI Mobility: BR Code Status: full Family Communication: will update wife once she arrives today  Disposition: discharge pending insurance authorization   Labs   CBC: Recent Labs  Lab 10/26/20 1519 10/26/20 1519 10/26/20 1629 10/27/20 0059 10/27/20 0115 10/28/20 0023 10/29/20  0214  WBC 14.5*  --  17.1*  --  14.7* 9.2 6.9  HGB 6.6*   < > 8.0* 8.2* 7.7* 7.6* 8.1*  HCT 20.9*   < > 25.0* 24.0* 24.6* 24.1* 26.1*  MCV 96.8  --  97.3  --  99.6 96.8 98.9  PLT 198  --  231  --  195 183 175   < > = values in this interval not displayed.    Basic Metabolic Panel: Recent Labs   Lab 10/25/20 2358 10/26/20 0528 10/26/20 0622 10/26/20 0622 10/26/20 [id number] 10/27/20 0059 10/27/20 0115 10/28/20 0023 10/29/20 0214  NA 139   < > 143   < > 143 139 140 141 142  K 3.3*   < > 3.4*   < > 3.6 3.9 4.0 4.6 3.7  CL 93*  --  97*  --   --   --  95* 95* 92*  CO2 37*  --  38*  --   --   --  32 36* 41*  GLUCOSE 195*  --  93  --   --   --  221* 306* 194*  BUN 26*  --  23*  --   --   --  23* 38* 37*  CREATININE 0.60*  --  0.57*  --   --   --  0.72 0.61 0.54*  CALCIUM 8.1*  --  8.2*  --   --   --  8.5* 8.5* 8.2*  MG 1.7  --  1.7  --   --   --  2.0 1.9 1.9  PHOS 4.9*  --  3.4  --   --   --  4.3 2.7 2.4*   < > = values in this interval not displayed.   GFR: Estimated Creatinine Clearance: 105.2 mL/min (A) (by C-G formula based on SCr of 0.54 mg/dL (L)). Recent Labs  Lab 10/25/20 2358 10/26/20 0622 10/26/20 1629 10/27/20 0115 10/28/20 0023 10/29/20 0214  PROCALCITON 0.25  --   --   --   --   --   WBC 36.1*   < > 17.1* 14.7* 9.2 6.9   < > = values in this interval not displayed.    Liver Function Tests: Recent Labs  Lab 10/23/20 1823  AST 34  ALT 32  ALKPHOS 65  BILITOT 0.8  PROT 4.7*  ALBUMIN 2.5*   No results for input(s): LIPASE, AMYLASE in the last 168 hours. No results for input(s): AMMONIA in the last 168 hours.  ABG    Component Value Date/Time   PHART 7.335 (L) 10/27/2020 0059   PCO2ART 76.9 (HH) 10/27/2020 0059   PO2ART 76 (L) 10/27/2020 0059   HCO3 41.2 (H) 10/27/2020 0059   TCO2 43 (H) 10/27/2020 0059   O2SAT 93.0 10/27/2020 0059     Coagulation Profile: No results for input(s): INR, PROTIME in the last 168 hours.  Cardiac Enzymes: No results for input(s): CKTOTAL, CKMB, CKMBINDEX, TROPONINI in the last 168 hours.  HbA1C: Hgb A1c MFr Bld  Date/Time Value Ref Range Status  10/12/2020 05:54 AM 6.5 (H) 4.8 - 5.6 % Final    Comment:    (NOTE)         Prediabetes: 5.7 - 6.4         Diabetes: >6.4         Glycemic control for adults with  diabetes: <7.0     CBG: Recent Labs  Lab 10/28/20 1210 10/28/20 1620 10/28/20 2051 10/28/20 2357 10/29/20 0403  GLUCAP 206* 309* 274* 206* 152*    Satina Jerrell A, DO 10/29/2020, 6:43 AM

## 2020-10-29 NOTE — Progress Notes (Signed)
Patient with a brief period of coughing resulting in an episode of bradycardia (~46) before recovering no intervention required.

## 2020-10-29 NOTE — TOC Progression Note (Signed)
Transition of Care [facility]) - Progression Note    Patient Details  Name: Lawrence Freeman MRN: [id number] Date of Birth: March 08, 1971  Transition of Care [facility]) CM/SW Contact  Verna Czech [place], [place] Phone Number: [phone] 10/29/2020, 12:30 PM  Clinical Narrative:    Phone call to [facility] Liaison [phone] to follow up on authorization for LTAC. Voicemail message left requesting a return call.   Ericia Moxley, LCSW Transitions of Care [phone]     Expected Discharge Plan: Long Term Acute Care (LTAC) Barriers to Discharge: Continued Medical Work up, English as a second language teacher  Expected Discharge Plan and Services Expected Discharge Plan: Long Term Acute Care (LTAC)                                               Social Determinants of Health (SDOH) Interventions    Readmission Risk Interventions No flowsheet data found.

## 2020-10-30 ENCOUNTER — Inpatient Hospital Stay (HOSPITAL_COMMUNITY): Payer: BC Managed Care – PPO

## 2020-10-30 DIAGNOSIS — J9621 Acute and chronic respiratory failure with hypoxia: Secondary | ICD-10-CM | POA: Diagnosis not present

## 2020-10-30 DIAGNOSIS — Z930 Tracheostomy status: Secondary | ICD-10-CM | POA: Diagnosis not present

## 2020-10-30 DIAGNOSIS — U071 COVID-19: Secondary | ICD-10-CM | POA: Diagnosis not present

## 2020-10-30 DIAGNOSIS — J209 Acute bronchitis, unspecified: Secondary | ICD-10-CM | POA: Diagnosis not present

## 2020-10-30 LAB — BASIC METABOLIC PANEL
Anion gap: 11 (ref 5–15)
BUN: 32 mg/dL — ABNORMAL HIGH (ref 6–20)
CO2: 38 mmol/L — ABNORMAL HIGH (ref 22–32)
Calcium: 8.1 mg/dL — ABNORMAL LOW (ref 8.9–10.3)
Chloride: 95 mmol/L — ABNORMAL LOW (ref 98–111)
Creatinine, Ser: 0.49 mg/dL — ABNORMAL LOW (ref 0.61–1.24)
GFR, Estimated: >60 mL/min (ref >60)
Glucose, Bld: 154 mg/dL — ABNORMAL HIGH (ref 70–99)
Potassium: 4.2 mmol/L (ref 3.5–5.1)
Sodium: 144 mmol/L (ref 135–145)

## 2020-10-30 LAB — CBC
HCT: 27 % — ABNORMAL LOW (ref 39.0–52.0)
Hemoglobin: 8.5 g/dL — ABNORMAL LOW (ref 13.0–17.0)
MCH: 31.5 pg (ref 26.0–34.0)
MCHC: 31.5 g/dL (ref 30.0–36.0)
MCV: 100 fL (ref 80.0–100.0)
Platelets: 175 10*3/uL (ref 150–400)
RBC: 2.7 MIL/uL — ABNORMAL LOW (ref 4.22–5.81)
RDW: 18 % — ABNORMAL HIGH (ref 11.5–15.5)
WBC: 8.6 10*3/uL (ref 4.0–10.5)
nRBC: 0 % (ref 0.0–0.2)

## 2020-10-30 LAB — GLUCOSE, CAPILLARY
Glucose-Capillary: 164 mg/dL — ABNORMAL HIGH (ref 70–99)
Glucose-Capillary: 169 mg/dL — ABNORMAL HIGH (ref 70–99)
Glucose-Capillary: 253 mg/dL — ABNORMAL HIGH (ref 70–99)
Glucose-Capillary: 248 mg/dL — ABNORMAL HIGH (ref 70–99)
Glucose-Capillary: 185 mg/dL — ABNORMAL HIGH (ref 70–99)

## 2020-10-30 LAB — MAGNESIUM: Magnesium: 1.8 mg/dL (ref 1.7–2.4)

## 2020-10-30 LAB — PHOSPHORUS: Phosphorus: 2.4 mg/dL — ABNORMAL LOW (ref 2.5–4.6)

## 2020-10-30 MED ORDER — AMLODIPINE BESYLATE 10 MG PO TABS
10.0000 mg | ORAL_TABLET | Freq: Every day | ORAL | Status: DC
  Administered 2020-10-30: 10 mg via ORAL
  Filled 2020-10-30: qty 1

## 2020-10-30 MED ORDER — AMLODIPINE BESYLATE 10 MG PO TABS: 10.0000 mg | ORAL_TABLET | Freq: Every day | ORAL | Status: DC

## 2020-10-30 MED ORDER — PRASUGREL HCL 10 MG PO TABS
10.0000 mg | ORAL_TABLET | Freq: Every day | ORAL | Status: DC
  Administered 2020-10-31 – 2020-11-01 (×2): 10 mg
  Filled 2020-10-30 (×2): qty 1

## 2020-10-30 NOTE — TOC Progression Note (Signed)
Transition of Care [facility]) - Progression Note    Patient Details  Name: Kristoff Coonradt MRN: [id number] Date of Birth: 25-Feb-1971  Transition of Care [facility]) CM/SW Contact  Verna Czech [place], [place] Phone Number: [phone] 10/30/2020, 3:37 PM  Clinical Narrative:    Return phone call from [facility] Liaison-Joe Panero. Patient authorization still pending, she will follow up tomorrow.  Fatimata Talsma, LCSW Transition of Care [phone]    Expected Discharge Plan: Long Term Acute Care (LTAC) Barriers to Discharge: Continued Medical Work up, English as a second language teacher  Expected Discharge Plan and Services Expected Discharge Plan: Long Term Acute Care (LTAC)                                               Social Determinants of Health (SDOH) Interventions    Readmission Risk Interventions No flowsheet data found.

## 2020-10-30 NOTE — Progress Notes (Signed)
Patients wife, [name], requesting Social Work to call her tomorrow, 10/31/2020 to touch base on status of placement at [facility]. She would also like to discuss applying for disability for the patient.

## 2020-10-30 NOTE — Progress Notes (Signed)
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  History of present illness   History per below and confirmed with wife at bedside:  "Lawrence Freeman an 13 y.o.malewith medical history significant of coronary disease, hypertension, hyperlipidemia, gout who was admitted to the acute facility after transfer from [facility] with COVID-19 pneumonia and pneumomediastinum. Apparently symptoms started on 08/09/2020, he tested positive for Covid on 08/15/2020. He was treated with remdesivir/dexamethasone and placed on high flow nasal cannula with subsequent pneumomediastinum. On 08/31/2019 when he was transferred to [facility] with initial improvement, weaned from BiPAP to high HFNC and then transferred out of the ICU. However, 09/05/2020 patient decompensated and was intubated. Janina Mayo was done on 09/20/2020. His hospital stay was complicated by ARDS, multiple infections."  Patient heavily sedated at time of evaluation. Was improving with intermittent PS trials. Today patient was to be downsized from size 9 to 6 cuffed shiley to facilitate weaning. Unforunately they were unable to pass new tracheostomy into stoma. Anesthesia called, multiple failed attempts at intubation.  >1 hour bagging. ENT called, placed 6-0 bivona into airway. Post these events, CXR showing large R PTX, extensive subcutaneous emphysema. Due to ER bed issues, patient to be direct admitted to ICU for management of unstable airway and pneumothorax.  Past Medical History  HTN HLD  Significant Hospital Events   08/15/20 +COVID 09/05/20 intubated  at [facility] 09/20/20 tracheostomy at [facility] 10/19/20 PEG by IR 10/25/20 trach exchange, prolonged bagging, [facility] 11/2 large bore chest tube R   Consults:  ID, cardiology  Procedures:  Trach exchange by ENT 10/25/20 11/2 R chest tube  Significant Diagnostic Tests:  11/1 CXR:>> extensive subcutaneous emphysema, R PTX 11/3 CXR>>no visible pneumothorax, continued pneumomediastinum and subc emphysema with patchy bilateral airspace opacities 11/4 CXR >> No PTX 11/5 CXR >> trach tube just above right mainstem, increased diffuse patchiness compared to prior   Micro Data:  10/24/20 blood cx neg  Antimicrobials:  Complicated allergy history per Dr. Raymondo Band note (10/13/20)  He has finished course for MRSA PNA with vanc He was previously on vanc/inh tobra and cipro prior to transfer from [facility]  Interim history/subjective:   Chest tube removed yesterday. Breathing appears more labored this morning but patient denies this. Also more awake today .   Objective   Blood pressure (!) 198/89, pulse 88, temperature 97.9 F (36.6 C), temperature source Axillary, resp. rate (!) 34, height 5\' 10"  (1.778 m), weight 68.1 kg, SpO2 96 %.    Vent Mode: PCV FiO2 (%):  [40 %] 40 % Set Rate:  [33 bmp] 33 bmp PEEP:  [5 cmH20] 5 cmH20 Plateau Pressure:  [26 cmH20-29 cmH20] 27 cmH20   Intake/Output Summary (Last 24 hours) at 10/30/2020 0747 Last data filed at 10/30/2020 0700 Gross per 24 hour  Intake 2372.78 ml  Output 1825 ml  Net 547.78 ml   Filed Weights   10/28/20 0300 10/29/20 0357 10/30/20 0500  Weight: 70.5 kg 67.3 kg 68.1 kg   General:  Thin, chronically ill appearing M, awake, tired appearing, following commands HEENT: MM pink/moist, trach in place without bleeding but with increased secretions  Neuro: awake, following commands, answering yes or no questions  CV: s1s2 rrr, IV/VI systolic and diastolic murmur  PULM:  On full vent support, clear breath sounds  bilaterally GI: soft, bsx4 active  Extremities: warm/dry, edema  Skin: no rashes or lesions   Resolved Hospital Problem list   Pneumothorax   Assessment & Plan:   Acute on chronic hypoxemic respiratory failure with iatrogenic Large R pneumothorax PEA Arrest CAD, tachy-brady issues, history 3-vessel disease with 2 stents placed 05/2019 Sinus Tach w/intermittent SVT HTN Need for sedation for prolonged mechanical ventilation, benzodiazepine dependence, iatrogenic  Anemia of Chronic Disease Presumed cefepime-induced allergic reaction 10/21   Klebsiella PNA DM worsened with steroids Small SDH Stage II Pressure Ulcer  Plan:  Breathing appears more labored this morning, but patient denies distress. This may be simply a fact that he is more awake and still has very stiff lungs. Trial of bronchodilator.  Repeat CXR to ensure small residual pneumothorax remains stable.  No further bradycardia now that rectal tube removed.  No signs of bleeding, can resume antiplatelet agents for cardiac stents.  Weaning off steroids. May have contributed to current neuromuscular weakness.  Starting amlodipine for hypertension.   Best practice:  Diet: NPO, on TF Pain/Anxiety/Delirium protocol (if indicated): Fentanyl, versed - transition to enteral sedation and allow patient to be more awake during the day to allow more mobilization/rehabilitation.  VAP protocol (if indicated): in place DVT prophylaxis: heparin GI prophylaxis: PPI Glucose control: SSI - control is suboptimal but should improve with steroid weaning.  Mobility: PT/OT - try chair position today. Code Status: full Family Communication: will update wife once she arrives today  Disposition: discharge pending insurance authorization   Labs   CBC: Recent Labs  Lab 10/26/20 1629 10/26/20 1629 10/27/20 0059 10/27/20 0115 10/28/20 0023 10/29/20 0214 10/30/20 0534  WBC 17.1*  --   --  14.7* 9.2 6.9 8.6  HGB 8.0*   < > 8.2* 7.7*  7.6* 8.1* 8.5*  HCT 25.0*   < > 24.0* 24.6* 24.1* 26.1* 27.0*  MCV 97.3  --   --  99.6 96.8 98.9 100.0  PLT 231  --   --  195 183 175 175   < > = values in this interval not displayed.    Basic Metabolic Panel: Recent Labs  Lab 10/26/20 0622 10/26/20 0937 10/27/20 0059 10/27/20 0115 10/28/20 0023 10/29/20 0214 10/30/20 0534  NA 143   < > 139 140 141 142 144  K 3.4*   < > 3.9 4.0 4.6 3.7 4.2  CL 97*  --   --  95* 95* 92* 95*  CO2 38*  --   --  32 36* 41* 38*  GLUCOSE 93  --   --  221* 306* 194* 154*  BUN 23*  --   --  23* 38* 37* 32*  CREATININE 0.57*  --   --  0.72 0.61 0.54* 0.49*  CALCIUM 8.2*  --   --  8.5* 8.5* 8.2* 8.1*  MG 1.7  --   --  2.0 1.9 1.9 1.8  PHOS 3.4  --   --  4.3 2.7 2.4* 2.4*   < > = values in this interval not displayed.   GFR: Estimated Creatinine Clearance: 106.4 mL/min (A) (by C-G formula based on SCr of 0.49 mg/dL (L)). Recent Labs  Lab 10/25/20 2358 10/26/20 0622 10/27/20 0115 10/28/20 0023 10/29/20 0214 10/30/20 0534  PROCALCITON 0.25  --   --   --   --   --  WBC 36.1*   < > 14.7* 9.2 6.9 8.6   < > = values in this interval not displayed.    Liver Function Tests: Recent Labs  Lab 10/23/20 1823  AST 34  ALT 32  ALKPHOS 65  BILITOT 0.8  PROT 4.7*  ALBUMIN 2.5*   No results for input(s): LIPASE, AMYLASE in the last 168 hours. No results for input(s): AMMONIA in the last 168 hours.  ABG    Component Value Date/Time   PHART 7.335 (L) 10/27/2020 0059   PCO2ART 76.9 (HH) 10/27/2020 0059   PO2ART 76 (L) 10/27/2020 0059   HCO3 41.2 (H) 10/27/2020 0059   TCO2 43 (H) 10/27/2020 0059   O2SAT 93.0 10/27/2020 0059     Coagulation Profile: No results for input(s): INR, PROTIME in the last 168 hours.  Cardiac Enzymes: No results for input(s): CKTOTAL, CKMB, CKMBINDEX, TROPONINI in the last 168 hours.  HbA1C: Hgb A1c MFr Bld  Date/Time Value Ref Range Status  10/12/2020 05:54 AM 6.5 (H) 4.8 - 5.6 % Final    Comment:     (NOTE)         Prediabetes: 5.7 - 6.4         Diabetes: >6.4         Glycemic control for adults with diabetes: <7.0     CBG: Recent Labs  Lab 10/29/20 1116 10/29/20 1559 10/29/20 1939 10/29/20 2312 10/30/20 0434  GLUCAP 283* 230* 229* 210* 164*    Lynnell Catalan, MD 10/30/2020, 7:47 AM

## 2020-10-31 ENCOUNTER — Inpatient Hospital Stay (HOSPITAL_COMMUNITY): Payer: BC Managed Care – PPO

## 2020-10-31 DIAGNOSIS — I959 Hypotension, unspecified: Secondary | ICD-10-CM

## 2020-10-31 DIAGNOSIS — U071 COVID-19: Secondary | ICD-10-CM | POA: Diagnosis not present

## 2020-10-31 DIAGNOSIS — J9621 Acute and chronic respiratory failure with hypoxia: Secondary | ICD-10-CM | POA: Diagnosis not present

## 2020-10-31 DIAGNOSIS — J9500 Unspecified tracheostomy complication: Secondary | ICD-10-CM

## 2020-10-31 DIAGNOSIS — J209 Acute bronchitis, unspecified: Secondary | ICD-10-CM | POA: Diagnosis not present

## 2020-10-31 DIAGNOSIS — J398 Other specified diseases of upper respiratory tract: Secondary | ICD-10-CM

## 2020-10-31 DIAGNOSIS — Z930 Tracheostomy status: Secondary | ICD-10-CM | POA: Diagnosis not present

## 2020-10-31 LAB — BLOOD GAS, ARTERIAL
Acid-Base Excess: 15.8 mmol/L — ABNORMAL HIGH (ref 0.0–2.0)
Acid-Base Excess: 13.7 mmol/L — ABNORMAL HIGH (ref 0.0–2.0)
Bicarbonate: 45 mmol/L — ABNORMAL HIGH (ref 20.0–28.0)
Bicarbonate: 39.5 mmol/L — ABNORMAL HIGH (ref 20.0–28.0)
Drawn by: 331001
FIO2: 100
FIO2: 60
O2 Saturation: 99.6 %
O2 Saturation: 99.4 %
Patient temperature: 37
Patient temperature: 36.6
pCO2 arterial: >120 mmHg (ref 32.0–48.0)
pCO2 arterial: 66.9 mmHg (ref 32.0–48.0)
pH, Arterial: 7.14 — CL (ref 7.350–7.450)
pH, Arterial: 7.386 (ref 7.350–7.450)
pO2, Arterial: 344 mmHg — ABNORMAL HIGH (ref 83.0–108.0)
pO2, Arterial: 133 mmHg — ABNORMAL HIGH (ref 83.0–108.0)

## 2020-10-31 LAB — POCT I-STAT 7, (LYTES, BLD GAS, ICA,H+H)
Calcium, Ion: 1.26 mmol/L (ref 1.15–1.40)
Calcium, Ion: 1.21 mmol/L (ref 1.15–1.40)
HCT: 29 % — ABNORMAL LOW (ref 39.0–52.0)
HCT: 27 % — ABNORMAL LOW (ref 39.0–52.0)
Hemoglobin: 9.9 g/dL — ABNORMAL LOW (ref 13.0–17.0)
Hemoglobin: 9.2 g/dL — ABNORMAL LOW (ref 13.0–17.0)
Patient temperature: 98.8
Patient temperature: 98.8
Potassium: 4.8 mmol/L (ref 3.5–5.1)
Potassium: 5 mmol/L (ref 3.5–5.1)
Sodium: 140 mmol/L (ref 135–145)
Sodium: 139 mmol/L (ref 135–145)
pCO2 arterial: >97 mmHg (ref 32.0–48.0)
pCO2 arterial: >97 mmHg (ref 32.0–48.0)
pH, Arterial: 7.17 — CL (ref 7.350–7.450)
pH, Arterial: 7.292 — ABNORMAL LOW (ref 7.350–7.450)
pO2, Arterial: 448 mmHg — ABNORMAL HIGH (ref 83.0–108.0)
pO2, Arterial: 142 mmHg — ABNORMAL HIGH (ref 83.0–108.0)

## 2020-10-31 LAB — GLUCOSE, CAPILLARY
Glucose-Capillary: 157 mg/dL — ABNORMAL HIGH (ref 70–99)
Glucose-Capillary: 177 mg/dL — ABNORMAL HIGH (ref 70–99)
Glucose-Capillary: 208 mg/dL — ABNORMAL HIGH (ref 70–99)
Glucose-Capillary: 229 mg/dL — ABNORMAL HIGH (ref 70–99)
Glucose-Capillary: 245 mg/dL — ABNORMAL HIGH (ref 70–99)
Glucose-Capillary: 263 mg/dL — ABNORMAL HIGH (ref 70–99)
Glucose-Capillary: 323 mg/dL — ABNORMAL HIGH (ref 70–99)

## 2020-10-31 MED ORDER — AMLODIPINE BESYLATE 5 MG PO TABS: 2.5000 mg | ORAL_TABLET | Freq: Every day | ORAL | Status: DC

## 2020-10-31 MED ORDER — LISINOPRIL 5 MG PO TABS
5.0000 mg | ORAL_TABLET | Freq: Every day | ORAL | Status: DC
  Filled 2020-10-31: qty 1

## 2020-10-31 MED ORDER — LACTATED RINGERS IV BOLUS
500.0000 mL | Freq: Once | INTRAVENOUS | Status: AC
  Administered 2020-10-31: 500 mL via INTRAVENOUS

## 2020-10-31 NOTE — Progress Notes (Signed)
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  History of present illness   History per below and confirmed with wife at bedside:  "Lawrence Pettingill Hensleyis an 49 y.o.malewith medical history significant of coronary disease, hypertension, hyperlipidemia, gout who was admitted to the acute facility after transfer from [facility] with COVID-19 pneumonia and pneumomediastinum. Apparently symptoms started on 08/09/2020, he tested positive for Covid on 08/15/2020. He was treated with remdesivir/dexamethasone and placed on high flow nasal cannula with subsequent pneumomediastinum. On 08/31/2019 when he was transferred to [facility] with initial improvement, weaned from BiPAP to high HFNC and then transferred out of the ICU. However, 09/05/2020 patient decompensated and was intubated. Janina Mayo was done on 09/20/2020. His hospital stay was complicated by ARDS, multiple infections."  Patient heavily sedated at time of evaluation. Was improving with intermittent PS trials. Today patient was to be downsized from size 9 to 6 cuffed shiley to facilitate weaning. Unforunately they were unable to pass new tracheostomy into stoma. Anesthesia called, multiple failed attempts at intubation.  >1 hour bagging. ENT called, placed 6-0 bivona into airway. Post these events, CXR showing large R PTX, extensive subcutaneous emphysema. Due to ER bed issues, patient to be direct admitted to ICU for management of unstable airway and pneumothorax.  Past Medical History  HTN HLD  Significant Hospital Events   08/15/20 +COVID 09/05/20 intubated  at [facility] 09/20/20 tracheostomy at [facility] 10/19/20 PEG by IR 10/25/20 trach exchange, prolonged bagging, [facility] 11/2 large bore chest tube R 11/8 desaturation and obtundation, trach partially dislodged but not out of trachea, ABG with hypercapnia,  Bronch performed and trach advanced 2cm with improvement  Consults:  ID, cardiology  Procedures:  Trach exchange by ENT 10/25/20 11/2 R chest tube  Significant Diagnostic Tests:  11/1 CXR:>> extensive subcutaneous emphysema, R PTX 11/3 CXR>>no visible pneumothorax, continued pneumomediastinum and subc emphysema with patchy bilateral airspace opacities 11/4 CXR >> No PTX 11/5 CXR >> trach tube just above right mainstem, increased diffuse patchiness compared to prior   Micro Data:  10/24/20 blood cx neg  Antimicrobials:  Complicated allergy history per Dr. Raymondo Band note (10/13/20)  He has finished course for MRSA PNA with vanc He was previously on vanc/inh tobra and cipro prior to transfer from [facility]  Interim history/subjective:  Desaturation early this AM.  Trach partially dislodged but on bronch eval, not out of trachea.  Advanced 2cm with improvement. Hypotensive after.  Now getting 500cc LR bolus, have asked RN to assess response.  Objective   Blood pressure 138/70, pulse 96, temperature 97.9 F (36.6 C), temperature source Oral, resp. rate 15, height 5\' 10"  (1.778 m), weight 68.1 kg, SpO2 100 %.    Vent Mode: PRVC FiO2 (%):  [40 %-100 %] 60 % Set Rate:  [15 bmp-25 bmp] 20 bmp Vt Set:  [540 mL] 540 mL PEEP:  [5 cmH20] 5 cmH20 Plateau Pressure:  [28 cmH20-33 cmH20] 33 cmH20   Intake/Output Summary (Last 24 hours) at 10/31/2020 0848 Last data filed at 10/31/2020 0600 Gross per 24 hour  Intake 1389.01 ml  Output 2100 ml  Net -710.99 ml  Filed Weights   10/28/20 0300 10/29/20 0357 10/30/20 0500  Weight: 70.5 kg 67.3 kg 68.1 kg   Physical Exam: General: Adult male, cachectic, chronically ill  appearing, resting in bed, in NAD. Neuro: Opens eyes to noxious stimuli. HEENT: [place]/AT. Sclerae anicteric. Trach C/D/I. Cardiovascular: RRR, no M/R/G.  Lungs: Respirations even and unlabored.  CTA bilaterally, No W/R/R. Abdomen: BS x 4, soft, NT/ND.  Musculoskeletal: No gross deformities, no edema.  Skin: Intact, warm, no rashes.   Assessment & Plan:   Acute on chronic hypoxemic and hypercapnic respiratory failure with iatrogenic large R pneumothorax - PTX now resolved after CT placement Worsening hypercapnia AM 11/8 after partial trach dislodgement - much improved after trach respositioning Hypotension - post trach repositioning and 1 dose lisinopril AM 11/8 Hx COVID 19 complicated by HCAP and post COVID fibrosis PEA Arrest CAD, tachy-brady issues, history 3-vessel disease with 2 stents placed 05/2019 Sinus Tach w/intermittent SVT HTN Need for sedation for prolonged mechanical ventilation, benzodiazepine dependence, iatrogenic Anemia of Chronic Disease Presumed cefepime-induced allergic reaction 10/21, started on steroids Klebsiella PNA DM worsened with steroids Small SDH Stage II Pressure Ulcer  Plan: Continue full vent support at current settings, ABG much improved Continue BD's Hold antihypertensives 500cc LR bolus now, assess response and redose as needed Continue to wean steroids Continue SSI Return to [facility] once bed available   Best practice:  Diet: NPO, on TF Pain/Anxiety/Delirium protocol (if indicated): Fentanyl, versed - transition to enteral sedation and allow patient to be more awake during the day to allow more mobilization/rehabilitation.  VAP protocol (if indicated): in place DVT prophylaxis: heparin GI prophylaxis: PPI Glucose control: SSI - control is suboptimal but should improve with steroid weaning.  Mobility: PT/OT - try chair position today. Code Status: full Family Communication: will update wife once she arrives today  Disposition: discharge back  to [facility] pending insurance authorization  CC time: 30 min   Rutherford Guys, [place] [facility] 10/31/2020, 9:02 AM

## 2020-10-31 NOTE — Progress Notes (Signed)
Inpatient Diabetes Program Recommendations  AACE/ADA: New Consensus Statement on Inpatient Glycemic Control (2015)  Target Ranges:  Prepandial:   less than 140 mg/dL      Peak postprandial:   less than 180 mg/dL (1-2 hours)      Critically ill patients:  140 - 180 mg/dL   Lab Results  Component Value Date   GLUCAP 323 (H) 10/31/2020   HGBA1C 6.5 (H) 10/12/2020    Review of Glycemic Control Results for Lawrence Freeman, Lawrence Freeman (MRN [id number]) as of 10/31/2020 10:35  Ref. Range 10/30/2020 15:29 10/30/2020 19:40 10/31/2020 00:12 10/31/2020 03:59 10/31/2020 08:13  Glucose-Capillary Latest Ref Range: 70 - 99 mg/dL 004 (H) 599 (H) 774 (H) 177 (H) 323 (H)   Outpatient Diabetes medications:  None Current orders for Inpatient glycemic control:  Novolog resistant q 4 hours Lantus 10 units q 12 hours Osmolite 60 cc/hr Inpatient Diabetes Program Recommendations:    May consider adding Novolog tube feed coverage 2 units q 4 hours.   Thanks,  Beryl Meager, RN, BC-ADM Inpatient Diabetes Coordinator Pager [phone] (8a-5p)

## 2020-10-31 NOTE — Progress Notes (Signed)
eLink Physician-Brief Progress Note Patient Name: Lawrence Freeman DOB: 04-Mar-1971 MRN: [id number]   Date of Service  10/31/2020  HPI/Events of Note  Desaturation and obtundation.  eICU Interventions  Repeat stat portable CXR, will ask ground crew to see patient, stat head CT if respiratory etiology is ruled out.        Thomasene Lot Latalia Etzler 10/31/2020, 6:25 AM

## 2020-10-31 NOTE — Progress Notes (Signed)
Will, NT got in report that patient received bath on day shift. Patient confirmed in front of this RN and Will, NT that he received a bath on day shift. Bath was not documented under daily cares.

## 2020-10-31 NOTE — Progress Notes (Signed)
Upon waiting for MD to call back for ABG results, RT sitting outside room and noticed brady to 59 whereas had been 80-100's. Tidal volumes had dropped in 100's from 230-379mL. Pressed E-link button to have someone camera in, RN in room as well. I administered breathing treatment as Diminished breath sounds were noted, some expiratory wheezes. I also suctioned pt with no return and catheter passing easily. MD asked RT to take pt off vent and bag to see how he ventilates via AMBU. RT met no resistance and pt was very easy to bag. MD stated to place back on the vent. Upon completion of neb, very minimal changes in breath sounds. Volumes on vent reading 300's.

## 2020-10-31 NOTE — Progress Notes (Signed)
  Called to bedside to evaluate reference desaturation and now obtunded.  Janina Mayo had partially come out earlier, advanced back by RT.  ABG obtained, 7.140/ > 120/ 344, CXR stable on right PTX.  On PCV, rate had been changed to 15 during the day yesterday.   Patient then had episode of becoming bradycardic into 50's and rate increased to 25 by RT with resolution.   On arrival, on PCV 26 with rate 25 with TV's around 500, PIP ~40 despite changing to [facility] to match 8cc/ 580 TV.   Bedside bronch performed by Dr. Everardo All to evaluate if false track was present.  See separate procedure note.  No false lumen.    On review of CXRs, trach appears to become more shallow, needing more distal positioning.    Patient starting to wake up and follow commands.   Brovanna trach was advanced by 2 cm with immediate improvement of PIP.    P:  Repeat CXR now ABG in 1 hr    Posey Boyer, ACNP [place] [facility] 10/31/2020, 7:17 AM  See Loretha Stapler for personal pager PCCM on call pager [phone]

## 2020-10-31 NOTE — Discharge Summary (Signed)
Physician Discharge Summary   Patient ID: Lawrence Freeman MRN: [id number] DOB/AGE: 01/30/1971 49 y.o.  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 - No further interventions required. - Follow CXR, anticipate small apical PTX to reabsorb.   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 - Wean as able. - Follow CXR intermittently.  HTN. Hx PEA Arrest, CAD, tachy-brady issues, history 3-vessel disease with 2 stents placed 05/2019, HTN. - Low dose lisinopril, caution with overshooting as he has had intermittent drops in BP. - Supportive care. - Follow up with Dr. Doylene Canard as needed.  Need for sedation for prolonged mechanical ventilation, benzodiazepine dependence, iatrogenic. - Continue enteral sedation, caution with oversedation. - Avoid continuous infusions.  Anemia of Chronic Disease. - Transfuse for Hgb < 7.  Presumed cefepime-induced allergic reaction 10/21, started on steroids. - Continue steroid wean x 5 days.  DM worsened with steroids. - Continue SSI, lantus.  Small SDH - chronic. - Supportive care.  Stage II Pressure Ulcer - Supportive care. - Wound dressing changes.    Discharge Summary  Pt is encephelopathic; therefore, this HPI is obtained from chart review.   Lawrence Freeman is a 49 y.o. y/o male with a PMH significant of coronary disease, hypertension, hyperlipidemia, gout who was admitted to the acute facility after transfer from [facility] with COVID-19 pneumonia and pneumomediastinum. Apparently symptoms started on 08/09/2020, he tested positive for Covid on 08/15/2020. He was treated with remdesivir/dexamethasone and placed on high flow  nasal cannula with subsequent pneumomediastinum. On 08/31/2019 when he was transferred to [facility] with initial improvement, weaned from BiPAP to high HFNC and then transferred out of the ICU. However, 09/05/2020 patient decompensated and was intubated. Lawrence Freeman was done on 09/20/2020. His hospital stay was complicated by ARDS, multiple infections. He was later transferred to [facility] in [place] for ongoing care and ongoing rehab efforts.  On 11/2, pt was to have size 9 trach downsized to a 6.  Unfortunately, team was unable to exchange his trach.  Anesthesia was called and was unable to intubate him from above.  He had over 1 hour of bagging while ENT was called emergently.  ENT was able to place a 6 bivona successfully.  Following trach placement, CXR demonstrated large right PTX with extensive subcutaneous emphysema.  He was subsequently transferred to [facility] as direct admit for further management.  Upon arrival to [facility] ICU, he had a right chest tube placed.  Follow up CXR demonstrated resolution of PTX.  Chest tube was continued on 20cm suction until 11/5 when it was change to water seal.  Repeat CXR was stable and on 11/6, chest tube was subsequently removed.  He was also started on enteral sedation in order to facilitate weaning from continuous infusions.  11/8 he had desaturation and change in mental status.  ABG revealed hypoxia and hypercapnia.  Lawrence Freeman was found to be partially dislodged.  He had bronchoscopy performed which confirmed trach to be within the trachea.  It was advanced 2cm and he had improvement in saturation.  Repeat ABG afterwards also showed improvement. Following trach change, he had hypotension that gradually responded to fluids.  His antihypertensives and enteral sedation were held for the day.  11/9, antihypertensives were adjusted / lowered given his drop the day prior.  Caution should be taken with increasing too abruptly.  Would also caution against over sedation now that he is on  enteral agents and we appear to have reached a happy medium.  On 11/9, he was deemed medically stable and was cleared for discharge back to [facility].         Significant Hospital Events   08/15/20 +COVID 09/05/20 intubated at [facility] 09/20/20 tracheostomy at [facility] 10/19/20 PEG by IR 10/25/20 trach exchange, prolonged bagging, [facility] 11/2 large bore chest tube R 11/6 chest tube removed 11/8 desaturation and obtundation, trach partially dislodged but not out of trachea, ABG with hypercapnia,  Bronch performed and trach advanced 2cm with improvement 11/9 transfer back to [facility]  Significant Diagnostic Studies  11/1 CXR >> extensive subcutaneous emphysema, R PTX 11/3 CXR>>no visible pneumothorax, continued pneumomediastinum and subc emphysema with patchy bilateral airspace opacities. 11/4 CXR >> No PTX 11/5 CXR >> trach tube just above right mainstem, increased diffuse patchiness compared to prior 11/7 CXR > small right apical PTX, stable 11/8 CXR > stable  Micro Data  11/1 blood cx > neg  Antimicrobials  None.  Consults  None.  Objective:  Blood pressure 120/88, pulse (!) 103, temperature 98.3 F (36.8 C), temperature source Oral, resp. rate (!) 26, height $RemoveBe'5\' 10"'UslrTPLis$  (1.778 m), weight 68 kg, SpO2 97 %.    Vent Mode: PRVC FiO2 (%):  [40 %-50 %] 40 % Set Rate:  [25 bmp] 25 bmp Vt Set:  [540 mL] 540 mL PEEP:  [5 cmH20] 5 cmH20 Plateau Pressure:  [28 cmH20-34 cmH20] 34 cmH20   Intake/Output Summary (Last 24 hours) at 11/01/2020 1408 Last data filed at 11/01/2020 1200 Gross per 24 hour  Intake 2010 ml  Output 1800 ml  Net 210 ml   Filed Weights   10/29/20 0357 10/30/20 0500 11/01/20 0417  Weight: 67.3 kg 68.1 kg 68 kg    Physical Examination: General: Adult male, resting in bed, in NAD. Neuro: Awake, tracks appropriately, follows intermittent commands. HEENT: /AT. Sclerae anicteric. Trach C/D/I. Cardiovascular: RRR, no M/R/G.  Lungs: Respirations even  and unlabored.  CTA bilaterally, No W/R/R. Abdomen: BS x 4, soft, NT/ND.  Musculoskeletal: No gross deformities, no edema.  Skin: Intact, warm, no rashes.  Discharge Labs:  BMET Recent Labs  Lab 10/26/20 0622 10/26/20 4970 10/27/20 0115 10/27/20 0115 10/28/20 0023 10/28/20 0023 10/29/20 0214 10/29/20 0214 10/30/20 0534 10/30/20 0534 10/31/20 0622 10/31/20 0646  NA 143   < > 140   < > 141  --  142  --  144  --  140 139  K 3.4*   < > 4.0   < > 4.6   < > 3.7   < > 4.2   < > 4.8 5.0  CL 97*  --  95*  --  95*  --  92*  --  95*  --   --   --   CO2 38*  --  32  --  36*  --  41*  --  38*  --   --   --   GLUCOSE 93  --  221*  --  306*  --  194*  --  154*  --   --   --   BUN 23*  --  23*  --  38*  --  37*  --  32*  --   --   --   CREATININE 0.57*  --  0.72  --  0.61  --  0.54*  --  0.49*  --   --   --   CALCIUM 8.2*  --  8.5*  --  8.5*  --  8.2*  --  8.1*  --   --   --   MG 1.7  --  2.0  --  1.9  --  1.9  --  1.8  --   --   --   PHOS 3.4  --  4.3  --  2.7  --  2.4*  --  2.4*  --   --   --    < > = values in this interval not displayed.    CBC Recent Labs  Lab 10/28/20 0023 10/28/20 0023 10/29/20 0214 10/29/20 0214 10/30/20 0534 10/31/20 0622 10/31/20 0646  HGB 7.6*   < > 8.1*   < > 8.5* 9.9* 9.2*  HCT 24.1*   < > 26.1*   < > 27.0* 29.0* 27.0*  WBC 9.2  --  6.9  --  8.6  --   --   PLT 183  --  175  --  175  --   --    < > = values in this interval not displayed.    Anti-Coagulation No results for input(s): INR in the last 168 hours.  Discharge Instructions    Change dressing (specify)   Complete by: As directed    Foam dressing to sacrum / buttocks q3days or PRN   Diet - low sodium heart healthy   Complete by: As directed    Increase activity slowly   Complete by: As directed           Allergies as of 11/01/2020      Reactions   Penicillins Anaphylaxis   Precedex [dexmedetomidine Hcl In Nacl] Other (See Comments)   Severe bradycardia      Medication  List    STOP taking these medications   aspirin EC 81 MG tablet   azelastine 0.1 % nasal spray Commonly known as: ASTELIN   Dexilant 60 MG capsule Generic drug: dexlansoprazole   gabapentin 300 MG capsule Commonly known as: NEURONTIN Replaced by: gabapentin 250 MG/5ML solution   metoprolol succinate 50 MG 24 hr tablet Commonly known as: TOPROL-XL   nitroGLYCERIN 0.4 MG SL tablet Commonly known as: NITROSTAT   rosuvastatin 10 MG tablet Commonly known as: CRESTOR   Vitamin D (Ergocalciferol) 1.25 MG (50000 UNIT) Caps capsule Commonly known as: DRISDOL     TAKE these medications   acetaminophen 160 MG/5ML solution Commonly known as: TYLENOL Place 20.3 mLs (650 mg total) into feeding tube every 6 (six) hours as needed for mild pain.   allopurinol 300 MG tablet Commonly known as: ZYLOPRIM Place 1 tablet (300 mg total) into feeding tube daily. Start taking on: November 02, 2020 What changed: how to take this   amantadine 50 MG/5ML solution Commonly known as: SYMMETREL Place 10 mLs (100 mg total) into feeding tube daily. Start taking on: November 02, 2020   chlorhexidine gluconate (MEDLINE KIT) 0.12 % solution Commonly known as: PERIDEX 15 mLs by Mouth Rinse route 2 (two) times daily.   Chlorhexidine Gluconate Cloth 2 % Pads Apply 6 each topically daily. Start taking on: November 02, 2020   clonazePAM 0.5 MG disintegrating tablet Commonly known as: KLONOPIN Place 1 tablet (0.5 mg total) into feeding tube every 6 (six)  hours.   docusate 50 MG/5ML liquid Commonly known as: COLACE Place 10 mLs (100 mg total) into feeding tube 2 (two) times daily as needed for mild constipation.   feeding supplement (PROSource TF) liquid Place 45 mLs into feeding tube 3 (three) times daily.   feeding supplement (OSMOLITE 1.5 CAL) Liqd Place 1,000 mLs into feeding tube continuous.   nutrition supplement (JUVEN) Pack Place 1 packet into feeding tube 2 (two) times daily between  meals.   fentaNYL 100 MCG/2ML injection Commonly known as: SUBLIMAZE Inject 1 mL (50 mcg total) into the vein once for 1 dose.   fentaNYL Soln Commonly known as: SUBLIMAZE Inject 50 mcg into the vein every 15 (fifteen) minutes as needed (to maintain RASS & CPOT goal.).   fiber Pack packet Place 1 packet into feeding tube 2 (two) times daily.   folic acid 1 MG tablet Commonly known as: FOLVITE Place 1 tablet (1 mg total) into feeding tube daily. Start taking on: November 02, 2020   gabapentin 250 MG/5ML solution Commonly known as: NEURONTIN Place 4 mLs (200 mg total) into feeding tube every 8 (eight) hours. Replaces: gabapentin 300 MG capsule   heparin 5000 UNIT/ML injection Inject 1 mL (5,000 Units total) into the skin every 8 (eight) hours.   hydrALAZINE 20 MG/ML injection Commonly known as: APRESOLINE Inject 0.5 mLs (10 mg total) into the vein every 4 (four) hours as needed (SBP > 170 or DBP > 100.).   insulin aspart 100 UNIT/ML injection Commonly known as: novoLOG Inject 0-20 Units into the skin every 4 (four) hours.   insulin glargine 100 UNIT/ML injection Commonly known as: LANTUS Inject 0.1 mLs (10 Units total) into the skin 2 (two) times daily.   ipratropium-albuterol 0.5-2.5 (3) MG/3ML Soln Commonly known as: DUONEB Take 3 mLs by nebulization every 4 (four) hours as needed.   lidocaine 5 % Commonly known as: LIDODERM Place 1 patch onto the skin daily. Remove & Discard patch within 12 hours or as directed by MD Start taking on: November 02, 2020   lisinopril 5 MG tablet Commonly known as: ZESTRIL Place 1 tablet (5 mg total) into feeding tube daily. Start taking on: November 02, 2020 What changed:   how to take this  when to take this   metoprolol tartrate 5 MG/5ML Soln injection Commonly known as: LOPRESSOR Inject 2.5 mLs (2.5 mg total) into the vein every 3 (three) hours as needed (HR > 115.).   ondansetron 4 MG/2ML Soln injection Commonly known  as: ZOFRAN Inject 2 mLs (4 mg total) into the vein every 6 (six) hours as needed for nausea.   oxyCODONE 5 MG immediate release tablet Commonly known as: Oxy IR/ROXICODONE Place 1 tablet (5 mg total) into feeding tube every 6 (six) hours.   pantoprazole 40 MG injection Commonly known as: PROTONIX Inject 40 mg into the vein at bedtime.   polyethylene glycol 17 g packet Commonly known as: MIRALAX / GLYCOLAX Place 17 g into feeding tube daily as needed for moderate constipation.   prasugrel 10 MG Tabs tablet Commonly known as: EFFIENT Place 1 tablet (10 mg total) into feeding tube daily. Start taking on: November 02, 2020 What changed: how to take this   predniSONE 5 MG/ML concentrated solution Place 3 mLs (15 mg total) into feeding tube daily with breakfast for 5 days. Start taking on: November 03, 2020   QUEtiapine 50 MG tablet Commonly known as: SEROQUEL Place 1 tablet (50 mg total) into feeding tube at bedtime.  sertraline 50 MG tablet Commonly known as: ZOLOFT Place 1 tablet (50 mg total) into feeding tube daily. Start taking on: November 02, 2020   sodium chloride flush 0.9 % Soln Commonly known as: NS 10-40 mLs by Intracatheter route every 12 (twelve) hours.   sodium chloride flush 0.9 % Soln Commonly known as: NS 10-40 mLs by Intracatheter route as needed (flush).   testosterone 50 MG/5GM (1%) Gel Commonly known as: ANDROGEL Place 5 g onto the skin daily. Start taking on: November 02, 2020   thiamine 100 MG tablet Place 1 tablet (100 mg total) into feeding tube daily. Start taking on: November 02, 2020            Discharge Care Instructions  (From admission, onward)         Start     Ordered   11/01/20 0000  Change dressing (specify)       Comments: Foam dressing to sacrum / buttocks q3days or PRN   11/01/20 1404           Disposition: [facility]   Discharge Condition:  Isay Perleberg has met maximum benefit of  inpatient care and is medically stable and cleared for discharge.  Patient is pending follow up as above.     Time spent on discharge: Greater than 35 minutes.    Montey Hora, [facility] 11/01/2020, 2:09 PM

## 2020-10-31 NOTE — Evaluation (Signed)
Physical Therapy Evaluation Patient Details Name: Lawrence Freeman MRN: [id number] DOB: 1971/09/05 Today's Date: 10/31/2020   History of Present Illness  Lawrence Freeman is an 49 y.o. male with medical history significant of coronary disease, hypertension, hyperlipidemia, gout who was admitted to the acute facility after transfer from [facility] with COVID-19 pneumonia and pneumomediastinum.  Apparently symptoms started on 08/09/2020, he tested positive for Covid on 08/15/2020.  He was treated with remdesivir/dexamethasone and placed on high flow nasal cannula with subsequent pneumomediastinum.  On 08/31/2019 when he was transferred to [facility] with initial improvement, weaned from BiPAP to high HFNC and then transferred out of the ICU.  However, 09/05/2020 patient decompensated and was intubated.  Janina Mayo was done on 09/20/2020.  His hospital stay was complicated by ARDS, multiple infections. [facility] was at [facility] end of Sept to 10/25/20 when he had issues with a trach exchange and was transferred to [facility].    Clinical Impression  Pt admitted with above diagnosis. Bed level evaluation completed nurse requests due to BP soft this am.  Pt deconditioned and will need to return to [facility] for further therapy.  Vent setting on 60%FiO2 with PEEP 5.  HR 85 bpm, 100% O2, intialy BP was 93/65 and post exercise 116/66.   Pt currently with functional limitations due to the deficits listed below (see PT Problem List). Pt will benefit from skilled PT to increase their independence and safety with mobility to allow discharge to the venue listed below.      Follow Up Recommendations LTACH;Supervision/Assistance - 24 hour    Equipment Recommendations  Other (comment) (TBA)    Recommendations for Other Services       Precautions / Restrictions Precautions Precautions: Fall Restrictions Weight Bearing Restrictions: No      Mobility  Bed Mobility               General bed mobility  comments: had to perform bed level eval as pts BP has been soft and nursing did not want PT getting pt OOB or to EOB.      Transfers                    Ambulation/Gait                Stairs            Wheelchair Mobility    Modified Rankin (Stroke Patients Only)       Balance                                             Pertinent Vitals/Pain Pain Assessment: No/denies pain    Home Living Family/patient expects to be discharged to:: Other (Comment)                 Additional Comments: [facility]    Prior Function Level of Independence: Needs assistance   Gait / Transfers Assistance Needed: Total assist - max assist transfers and most likely use of lift  ADL's / Homemaking Assistance Needed: total care        Hand Dominance        Extremity/Trunk Assessment   Upper Extremity Assessment Upper Extremity Assessment: Generalized weakness    Lower Extremity Assessment Lower Extremity Assessment: RLE deficits/detail;LLE deficits/detail RLE Deficits / Details: grossly 2/5 LLE Deficits / Details: grossly 2/5  Communication   Communication: Tracheostomy  Cognition Arousal/Alertness: Awake/alert Behavior During Therapy: Flat affect Overall Cognitive Status: Difficult to assess                                        General Comments      Exercises General Exercises - Upper Extremity Shoulder Flexion: AAROM;Both;10 reps;Supine Elbow Flexion: AAROM;Both;10 reps;Supine Elbow Extension: AAROM;Both;10 reps;Supine General Exercises - Lower Extremity Ankle Circles/Pumps: AAROM;Both;10 reps;Supine Heel Slides: AAROM;Both;10 reps;Seated Straight Leg Raises: AAROM;Both;10 reps;Supine   Assessment/Plan    PT Assessment Patient needs continued PT services  PT Problem List Decreased activity tolerance;Decreased balance;Decreased strength;Decreased range of motion;Decreased mobility;Decreased knowledge  of use of DME;Decreased safety awareness;Decreased knowledge of precautions;Cardiopulmonary status limiting activity       PT Treatment Interventions DME instruction;Functional mobility training;Therapeutic activities;Therapeutic exercise;Balance training;Patient/family education    PT Goals (Current goals can be found in the Care Plan section)  Acute Rehab PT Goals Patient Stated Goal: unable to state PT Goal Formulation: Patient unable to participate in goal setting Time For Goal Achievement: 11/14/20 Potential to Achieve Goals: Good    Frequency Min 3X/week   Barriers to discharge        Co-evaluation               AM-PAC PT "6 Clicks" Mobility  Outcome Measure Help needed turning from your back to your side while in a flat bed without using bedrails?: Total Help needed moving from lying on your back to sitting on the side of a flat bed without using bedrails?: Total Help needed moving to and from a bed to a chair (including a wheelchair)?: Total Help needed standing up from a chair using your arms (e.g., wheelchair or bedside chair)?: Total Help needed to walk in hospital room?: Total Help needed climbing 3-5 steps with a railing? : Total 6 Click Score: 6    End of Session   Activity Tolerance: Patient limited by fatigue Patient left: in bed;with call bell/phone within reach;with SCD's reapplied Nurse Communication: Mobility status;Need for lift equipment PT Visit Diagnosis: Muscle weakness (generalized) (M62.81)    Time: 6073-7106 PT Time Calculation (min) (ACUTE ONLY): 14 min   Charges:   PT Evaluation $PT Eval Moderate Complexity: 1 Mod          Jonavin Seder W,PT Acute Rehabilitation Services Pager:  [phone]  Office:  [phone]    Berline Lopes 10/31/2020, 1:34 PM

## 2020-10-31 NOTE — Procedures (Signed)
Bronchoscopy Procedure Note  Lawrence Freeman  [id number]  05/12/1971  Date:10/31/20  Time:7:06 AM   Provider Performing:Darsi Tien Rodman Pickle   Procedure(s):  Flexible Bronchoscopy [phone])  Indication(s) Tracheostomy placement  Consent Unable to obtain consent due to emergent nature of procedure.  Anesthesia None   Time Out Verified patient identification, verified procedure, site/side was marked, verified correct patient position, special equipment/implants available, medications/allergies/relevant history reviewed, required imaging and test results available.   Sterile Technique Usual hand hygiene, masks and gloves were used   Procedure Description Bronchoscope advanced through tracheostomy tube and into airway.  Airways were examined down to subsegmental level with findings noted below.    Findings: Tracheostomy tip within trachea, adjacent to tracheal wall. Visualized main carina.  Complications/Tolerance None; patient tolerated the procedure well. Chest X-ray is needed post procedure.   EBL Minimal   Specimen(s) None  Action Taken: Tracheostomy advanced 2 cm with improvement in pressures  Rodman Pickle, M.D. [facility]monary/Critical Care Medicine 10/31/2020 7:08 AM

## 2020-10-31 NOTE — Progress Notes (Signed)
Called to patients room due to desaturation and air leak. Upon my arrival, I noticed trach had slid out from mark of where adequate placement from when trach was placed. Charge RT and myself removed saline from cuff and pushed trach back in to mark and re-secured the lock for the trach and added saline back until MOV was reached. Pt was placed back on 40%, Spo2 currently 96% and BBS are equal and HR has come down from 140's to 118.

## 2020-10-31 NOTE — Progress Notes (Signed)
eLink Physician-Brief Progress Note Patient Name: Lawrence Freeman DOB: [birth year] MRN: [id number]   Date of Service  10/31/2020  HPI/Events of Note  Patient with partial dislodgement of trach which was reinserted by RT, patient desaturated following event but has returned to baseline saturation after reinsertion.  eICU Interventions  Stat portable CXR ordered to verify trach position and exclude aspiration during dislodgement.        [name] Such 10/31/2020, 4:22 AM

## 2020-10-31 NOTE — Progress Notes (Signed)
eLink Physician-Brief Progress Note Patient Name: Lawrence Freeman DOB: [birth year] MRN: [id number]   Date of Service  10/31/2020  HPI/Events of Note  CXR reviewed.  eICU Interventions  Stable right pneumothorax, otherwise essentially unchanged CXR.        [name] 10/31/2020, 5:19 AM

## 2020-10-31 NOTE — Progress Notes (Signed)
ABG ran on ISTAT and ph of 7.170/CO2 is greater than 97.0 and Pao2 of 448, all other results will not show. RT will attempt to send down to lab for more values. Elink called with results.

## 2020-10-31 NOTE — TOC Progression Note (Addendum)
Transition of Care [facility]) - Progression Note    Patient Details  Name: Lawrence Freeman MRN: [id number] Date of Birth: Jul 09, 1971  Transition of Care [facility]) CM/SW Contact  Epifanio Lesches, RN Phone Number: 10/31/2020, 10:07 AM  Clinical Narrative:     NCM received call from [facility] admission liaison Krebs. Victorino Dike made [facility] aware insurance  approval for LTAC received. States can receive pt late afternoon today if medically ready...will need d/c summary from MD. NCM made nurse and wife, Lawrence Freeman aware. TOC team will continue to monitor and follow....  Expected Discharge Plan: Long Term Acute Care (LTAC) Barriers to Discharge: Continued Medical Work up, Conservator, museum/gallery and Services Expected Discharge Plan: Long Term Acute Care (LTAC)                                               Social Determinants of Health (SDOH) Interventions    Readmission Risk Interventions No flowsheet data found.

## 2020-10-31 NOTE — Progress Notes (Signed)
CRITICAL VALUE ALERT  Critical Value:  CO2   Date & Time Notied:  10/31/20 9476  Provider Notified: Celine Mans PA  Orders Received/Actions taken: no new orders  Malva Limes RN

## 2020-11-01 ENCOUNTER — Other Ambulatory Visit (HOSPITAL_COMMUNITY): Payer: Self-pay

## 2020-11-01 ENCOUNTER — Inpatient Hospital Stay
Admission: RE | Admit: 2020-11-01 | Discharge: 2020-11-21 | Disposition: A | Discharge status: Other Acute Inpatient Hospital | Payer: BC Managed Care – PPO | Source: Ambulatory Visit | Attending: Pulmonary Disease | Admitting: Pulmonary Disease

## 2020-11-01 DIAGNOSIS — R112 Nausea with vomiting, unspecified: Secondary | ICD-10-CM

## 2020-11-01 DIAGNOSIS — J1282 Pneumonia due to coronavirus disease 2019: Secondary | ICD-10-CM | POA: Diagnosis present

## 2020-11-01 DIAGNOSIS — Z431 Encounter for attention to gastrostomy: Secondary | ICD-10-CM

## 2020-11-01 DIAGNOSIS — R0989 Other specified symptoms and signs involving the circulatory and respiratory systems: Secondary | ICD-10-CM

## 2020-11-01 DIAGNOSIS — U071 COVID-19: Secondary | ICD-10-CM | POA: Diagnosis present

## 2020-11-01 DIAGNOSIS — J9690 Respiratory failure, unspecified, unspecified whether with hypoxia or hypercapnia: Secondary | ICD-10-CM

## 2020-11-01 DIAGNOSIS — J9621 Acute and chronic respiratory failure with hypoxia: Secondary | ICD-10-CM | POA: Diagnosis present

## 2020-11-01 DIAGNOSIS — J9500 Unspecified tracheostomy complication: Secondary | ICD-10-CM | POA: Diagnosis present

## 2020-11-01 LAB — BLOOD GAS, ARTERIAL
Acid-Base Excess: 12.2 mmol/L — ABNORMAL HIGH (ref 0.0–2.0)
Bicarbonate: 38.2 mmol/L — ABNORMAL HIGH (ref 20.0–28.0)
FIO2: 40
O2 Saturation: 97.5 %
Patient temperature: 36.6
pCO2 arterial: 69.9 mmHg (ref 32.0–48.0)
pH, Arterial: 7.354 (ref 7.350–7.450)
pO2, Arterial: 91.2 mmHg (ref 83.0–108.0)

## 2020-11-01 LAB — CBC WITH DIFFERENTIAL/PLATELET
Abs Immature Granulocytes: 0.12 10*3/uL — ABNORMAL HIGH (ref 0.00–0.07)
Basophils Absolute: 0 10*3/uL (ref 0.0–0.1)
Basophils Relative: 0 %
Eosinophils Absolute: 0 10*3/uL (ref 0.0–0.5)
Eosinophils Relative: 0 %
HCT: 30.3 % — ABNORMAL LOW (ref 39.0–52.0)
Hemoglobin: 9.7 g/dL — ABNORMAL LOW (ref 13.0–17.0)
Immature Granulocytes: 1 %
Lymphocytes Relative: 3 %
Lymphs Abs: 0.6 10*3/uL — ABNORMAL LOW (ref 0.7–4.0)
MCH: 31.2 pg (ref 26.0–34.0)
MCHC: 32 g/dL (ref 30.0–36.0)
MCV: 97.4 fL (ref 80.0–100.0)
Monocytes Absolute: 0.5 10*3/uL (ref 0.1–1.0)
Monocytes Relative: 2 %
Neutro Abs: 18.5 10*3/uL — ABNORMAL HIGH (ref 1.7–7.7)
Neutrophils Relative %: 94 %
Platelets: 215 10*3/uL (ref 150–400)
RBC: 3.11 MIL/uL — ABNORMAL LOW (ref 4.22–5.81)
RDW: 18.6 % — ABNORMAL HIGH (ref 11.5–15.5)
WBC: 19.8 10*3/uL — ABNORMAL HIGH (ref 4.0–10.5)
nRBC: 0 % (ref 0.0–0.2)

## 2020-11-01 LAB — GLUCOSE, CAPILLARY
Glucose-Capillary: 179 mg/dL — ABNORMAL HIGH (ref 70–99)
Glucose-Capillary: 144 mg/dL — ABNORMAL HIGH (ref 70–99)
Glucose-Capillary: 210 mg/dL — ABNORMAL HIGH (ref 70–99)

## 2020-11-01 MED ORDER — FOLIC ACID 1 MG PO TABS: 1.0000 mg | ORAL_TABLET | Freq: Every day | ORAL | Status: AC

## 2020-11-01 MED ORDER — QUETIAPINE FUMARATE 50 MG PO TABS: 50.0000 mg | ORAL_TABLET | Freq: Every day | ORAL | Status: AC

## 2020-11-01 MED ORDER — FENTANYL CITRATE (PF) 100 MCG/2ML IJ SOLN: 50.0000 ug | Freq: Once | INTRAMUSCULAR | 0 refills | Status: AC

## 2020-11-01 MED ORDER — SODIUM CHLORIDE 0.9% FLUSH
10.0000 mL | INTRAVENOUS | Status: AC | PRN
Start: 2020-11-01 — End: ?

## 2020-11-01 MED ORDER — OXYCODONE HCL 5 MG PO TABS: 5.0000 mg | ORAL_TABLET | Freq: Four times a day (QID) | ORAL | 0 refills | Status: DC

## 2020-11-01 MED ORDER — HYDRALAZINE HCL 20 MG/ML IJ SOLN: 10.0000 mg | INTRAMUSCULAR | Status: DC | PRN

## 2020-11-01 MED ORDER — FENTANYL BOLUS VIA INFUSION: 50.0000 ug | INTRAVENOUS | 0 refills | Status: DC | PRN

## 2020-11-01 MED ORDER — JUVEN PO PACK: 1.0000 | PACK | Freq: Two times a day (BID) | ORAL | 0 refills | Status: AC

## 2020-11-01 MED ORDER — THIAMINE HCL 100 MG PO TABS: 100.0000 mg | ORAL_TABLET | Freq: Every day | ORAL | Status: AC

## 2020-11-01 MED ORDER — POLYETHYLENE GLYCOL 3350 17 G PO PACK: 17.0000 g | PACK | Freq: Every day | ORAL | 0 refills | Status: AC | PRN

## 2020-11-01 MED ORDER — LIDOCAINE 5 % EX PTCH: 1.0000 | MEDICATED_PATCH | CUTANEOUS | 0 refills | Status: AC

## 2020-11-01 MED ORDER — SODIUM CHLORIDE 0.9% FLUSH
10.0000 mL | Freq: Two times a day (BID) | INTRAVENOUS | Status: DC
  Administered 2020-11-01: 20 mL

## 2020-11-01 MED ORDER — ONDANSETRON HCL 4 MG/2ML IJ SOLN: 4.0000 mg | Freq: Four times a day (QID) | INTRAMUSCULAR | 0 refills | Status: AC | PRN

## 2020-11-01 MED ORDER — CHLORHEXIDINE GLUCONATE CLOTH 2 % EX PADS: 6.0000 | MEDICATED_PAD | Freq: Every day | CUTANEOUS | Status: AC

## 2020-11-01 MED ORDER — SERTRALINE HCL 50 MG PO TABS: 50.0000 mg | ORAL_TABLET | Freq: Every day | ORAL | Status: AC

## 2020-11-01 MED ORDER — AMANTADINE HCL 50 MG/5ML PO SOLN: 100.0000 mg | Freq: Every day | ORAL | Status: AC

## 2020-11-01 MED ORDER — CHLORHEXIDINE GLUCONATE 0.12% ORAL RINSE (MEDLINE KIT): 15.0000 mL | Freq: Two times a day (BID) | OROMUCOSAL | 0 refills | Status: AC

## 2020-11-01 MED ORDER — GABAPENTIN 250 MG/5ML PO SOLN
200.0000 mg | Freq: Three times a day (TID) | ORAL | 12 refills | Status: DC
Start: 2020-11-01 — End: 2020-11-23

## 2020-11-01 MED ORDER — TESTOSTERONE 50 MG/5GM (1%) TD GEL: 5.0000 g | Freq: Every day | TRANSDERMAL | Status: AC

## 2020-11-01 MED ORDER — OSMOLITE 1.5 CAL PO LIQD: 1000.0000 mL | ORAL | 0 refills | Status: AC

## 2020-11-01 MED ORDER — PREDNISONE 5 MG/ML PO CONC: 15.0000 mg | Freq: Every day | ORAL | 0 refills | Status: AC

## 2020-11-01 MED ORDER — SODIUM CHLORIDE 0.9% FLUSH: 10.0000 mL | INTRAVENOUS | Status: DC | PRN

## 2020-11-01 MED ORDER — HEPARIN SODIUM (PORCINE) 5000 UNIT/ML IJ SOLN: 5000.0000 [IU] | Freq: Three times a day (TID) | INTRAMUSCULAR | Status: AC

## 2020-11-01 MED ORDER — INSULIN GLARGINE 100 UNIT/ML ~~LOC~~ SOLN: 10.0000 [IU] | Freq: Two times a day (BID) | SUBCUTANEOUS | 11 refills | Status: DC

## 2020-11-01 MED ORDER — IPRATROPIUM-ALBUTEROL 0.5-2.5 (3) MG/3ML IN SOLN: 3.0000 mL | RESPIRATORY_TRACT | Status: DC | PRN

## 2020-11-01 MED ORDER — PANTOPRAZOLE SODIUM 40 MG IV SOLR
40.0000 mg | Freq: Every day | INTRAVENOUS | Status: DC
Start: 2020-11-01 — End: 2020-11-23

## 2020-11-01 MED ORDER — LISINOPRIL 5 MG PO TABS
5.0000 mg | ORAL_TABLET | Freq: Every day | ORAL | Status: DC
Start: 2020-11-02 — End: 2020-11-23

## 2020-11-01 MED ORDER — CLONAZEPAM 0.5 MG PO TBDP
0.5000 mg | ORAL_TABLET | Freq: Four times a day (QID) | ORAL | 0 refills | Status: DC
Start: 2020-11-01 — End: 2020-11-23

## 2020-11-01 MED ORDER — INSULIN ASPART 100 UNIT/ML ~~LOC~~ SOLN
0.0000 [IU] | SUBCUTANEOUS | 11 refills | Status: DC
Start: 2020-11-01 — End: 2020-11-23

## 2020-11-01 MED ORDER — PRASUGREL HCL 10 MG PO TABS: 10.0000 mg | ORAL_TABLET | Freq: Every day | ORAL | Status: AC

## 2020-11-01 MED ORDER — METOPROLOL TARTRATE 5 MG/5ML IV SOLN
2.5000 mg | INTRAVENOUS | Status: DC | PRN
Start: 2020-11-01 — End: 2020-11-23

## 2020-11-01 MED ORDER — ACETAMINOPHEN 160 MG/5ML PO SOLN: 650.0000 mg | Freq: Four times a day (QID) | ORAL | 0 refills | Status: AC | PRN

## 2020-11-01 MED ORDER — NUTRISOURCE FIBER PO PACK: 1.0000 | PACK | Freq: Two times a day (BID) | ORAL | Status: AC

## 2020-11-01 MED ORDER — ALLOPURINOL 300 MG PO TABS: 300.0000 mg | ORAL_TABLET | Freq: Every day | ORAL | Status: AC

## 2020-11-01 MED ORDER — OXYCODONE HCL 5 MG PO TABS
5.0000 mg | ORAL_TABLET | Freq: Four times a day (QID) | ORAL | Status: DC
  Administered 2020-11-01: 5 mg
  Filled 2020-11-01: qty 1

## 2020-11-01 MED ORDER — DOCUSATE SODIUM 50 MG/5ML PO LIQD: 100.0000 mg | Freq: Two times a day (BID) | ORAL | 0 refills | Status: AC | PRN

## 2020-11-01 MED ORDER — IOHEXOL 300 MG/ML  SOLN
50.0000 mL | Freq: Once | INTRAMUSCULAR | Status: AC | PRN
  Administered 2020-11-01: 50 mL

## 2020-11-01 MED ORDER — CLONAZEPAM 0.5 MG PO TBDP
0.5000 mg | ORAL_TABLET | Freq: Four times a day (QID) | ORAL | Status: DC
  Administered 2020-11-01: 0.5 mg
  Filled 2020-11-01: qty 1

## 2020-11-01 MED ORDER — SODIUM CHLORIDE 0.9% FLUSH
10.0000 mL | Freq: Two times a day (BID) | INTRAVENOUS | Status: AC
Start: 2020-11-01 — End: ?

## 2020-11-01 MED ORDER — PROSOURCE TF PO LIQD: 45.0000 mL | Freq: Three times a day (TID) | ORAL | Status: AC

## 2020-11-01 NOTE — TOC Transition Note (Addendum)
Transition of Care [facility]) - CM/SW Discharge Note   Patient Details  Name: Lawrence Freeman MRN: [id number] Date of Birth: February 21, 1971  Transition of Care [facility]) CM/SW Contact:  Epifanio Lesches, RN Phone Number: 11/01/2020, 11:26 AM   Clinical Narrative:    [facility] will [facility] to: [facility] Anticipated DC date: 11/01/2020 Family notified: wife  Presented with severe COVID ARDS /post-COVID fibrosis, vent-dependent   developed pneumothorax after a tracheostomy change complication. Pt from [facility]. Supportive wife, [name].  Per MD patient ready for DC today. RN, patient, patient's family, and facility notified of DC. Pt will d/c back to [facility]. Rm # [phone]. Receiving Dr.Hijazi. Nurse to call  report to [phone].   RNCM will sign off for now as intervention is no longer needed. Please consult [place] again if new needs arise.      Final next level of care: Long Term Acute Care (LTAC) Barriers to Discharge: No Barriers Identified   Patient Goals and CMS Choice        Discharge Placement                       Discharge Plan and Services                                     Social Determinants of Health (SDOH) Interventions     Readmission Risk Interventions No flowsheet data found.

## 2020-11-01 NOTE — Progress Notes (Signed)
Rested well overnight. Gave scheduled oxycodone and klonopin at 2015 last night because he had not had either since 0015 on 11/8 and was anxious and restless. Received scheduled gabapentin and Seroquel. Patient resting well this morning therefore did not give 0600 dose of klonopin nor oxycodone since patient was obtunded yesterday morning.

## 2020-11-02 DIAGNOSIS — U071 COVID-19: Secondary | ICD-10-CM

## 2020-11-02 DIAGNOSIS — J1282 Pneumonia due to coronavirus disease 2019: Secondary | ICD-10-CM

## 2020-11-02 DIAGNOSIS — J9621 Acute and chronic respiratory failure with hypoxia: Secondary | ICD-10-CM | POA: Diagnosis not present

## 2020-11-02 DIAGNOSIS — J9500 Unspecified tracheostomy complication: Secondary | ICD-10-CM | POA: Diagnosis not present

## 2020-11-02 LAB — BASIC METABOLIC PANEL
Anion gap: 12 (ref 5–15)
BUN: 49 mg/dL — ABNORMAL HIGH (ref 6–20)
CO2: 34 mmol/L — ABNORMAL HIGH (ref 22–32)
Calcium: 9.3 mg/dL (ref 8.9–10.3)
Chloride: 93 mmol/L — ABNORMAL LOW (ref 98–111)
Creatinine, Ser: 0.67 mg/dL (ref 0.61–1.24)
GFR, Estimated: >60 mL/min (ref >60)
Glucose, Bld: 179 mg/dL — ABNORMAL HIGH (ref 70–99)
Potassium: 5.1 mmol/L (ref 3.5–5.1)
Sodium: 139 mmol/L (ref 135–145)

## 2020-11-02 LAB — BLOOD GAS, ARTERIAL
Acid-Base Excess: 11.9 mmol/L — ABNORMAL HIGH (ref 0.0–2.0)
Bicarbonate: 37.6 mmol/L — ABNORMAL HIGH (ref 20.0–28.0)
FIO2: 40
O2 Saturation: 98.3 %
Patient temperature: 36.6
pCO2 arterial: 66 mmHg (ref 32.0–48.0)
pH, Arterial: 7.372 (ref 7.350–7.450)
pO2, Arterial: 105 mmHg (ref 83.0–108.0)

## 2020-11-03 DIAGNOSIS — J1282 Pneumonia due to coronavirus disease 2019: Secondary | ICD-10-CM | POA: Diagnosis not present

## 2020-11-03 DIAGNOSIS — J9621 Acute and chronic respiratory failure with hypoxia: Secondary | ICD-10-CM | POA: Diagnosis not present

## 2020-11-03 DIAGNOSIS — J9500 Unspecified tracheostomy complication: Secondary | ICD-10-CM | POA: Diagnosis not present

## 2020-11-03 DIAGNOSIS — U071 COVID-19: Secondary | ICD-10-CM | POA: Diagnosis not present

## 2020-11-03 LAB — BASIC METABOLIC PANEL
Anion gap: 14 (ref 5–15)
BUN: 46 mg/dL — ABNORMAL HIGH (ref 6–20)
CO2: 31 mmol/L (ref 22–32)
Calcium: 9.5 mg/dL (ref 8.9–10.3)
Chloride: 93 mmol/L — ABNORMAL LOW (ref 98–111)
Creatinine, Ser: 0.49 mg/dL — ABNORMAL LOW (ref 0.61–1.24)
GFR, Estimated: >60 mL/min (ref >60)
Glucose, Bld: 102 mg/dL — ABNORMAL HIGH (ref 70–99)
Potassium: 4.5 mmol/L (ref 3.5–5.1)
Sodium: 138 mmol/L (ref 135–145)

## 2020-11-03 LAB — CBC
HCT: 29.7 % — ABNORMAL LOW (ref 39.0–52.0)
Hemoglobin: 9.8 g/dL — ABNORMAL LOW (ref 13.0–17.0)
MCH: 31.7 pg (ref 26.0–34.0)
MCHC: 33 g/dL (ref 30.0–36.0)
MCV: 96.1 fL (ref 80.0–100.0)
Platelets: 186 10*3/uL (ref 150–400)
RBC: 3.09 MIL/uL — ABNORMAL LOW (ref 4.22–5.81)
RDW: 18.6 % — ABNORMAL HIGH (ref 11.5–15.5)
WBC: 14.9 10*3/uL — ABNORMAL HIGH (ref 4.0–10.5)
nRBC: 0 % (ref 0.0–0.2)

## 2020-11-03 NOTE — Progress Notes (Signed)
Pulmonary Critical Care Medicine [facility] [place]   PULMONARY CRITICAL CARE SERVICE  PROGRESS NOTE  Date of Service: 11/03/2020  Lawrence Freeman  PIR:[id number]  DOB: [birth year]   DOA: 11/01/2020  Referring Physician: Carron Curie, MD  HPI: Lawrence Freeman is a 49 y.o. male seen for follow up of Acute on Chronic Respiratory Failure.  Patient at this time is on pressure support is been on 40% FiO2 appears to be comfortable right now without distress  Medications: Reviewed on Rounds  Physical Exam:  Vitals: Temperature is 96.4 pulse ninety-seven respiratory rate is thirty blood pressure is 122/43 saturations 97%  Ventilator Settings on pressure support FiO2 40% pressure 12/5  . General: Comfortable at this time . Eyes: Grossly normal lids, irises & conjunctiva . ENT: grossly tongue is normal . Neck: no obvious mass . Cardiovascular: S1 S2 normal no gallop . Respiratory: Scattered rhonchi very coarse breath sounds . Abdomen: soft . Skin: no rash seen on limited exam . Musculoskeletal: not rigid . Psychiatric:unable to assess . Neurologic: no seizure no involuntary movements         Lab Data:   Basic Metabolic Panel: Recent Labs  Lab 10/28/20 0023 10/28/20 0023 10/29/20 0214 10/29/20 0214 10/30/20 0534 10/31/20 0622 10/31/20 0646 11/02/20 0428 11/03/20 0420  NA 141   < > 142   < > 144 140 139 139 138  K 4.6   < > 3.7   < > 4.2 4.8 5.0 5.1 4.5  CL 95*  --  92*  --  95*  --   --  93* 93*  CO2 36*  --  41*  --  38*  --   --  34* 31  GLUCOSE 306*  --  194*  --  154*  --   --  179* 102*  BUN 38*  --  37*  --  32*  --   --  49* 46*  CREATININE 0.61  --  0.54*  --  0.49*  --   --  0.67 0.49*  CALCIUM 8.5*  --  8.2*  --  8.1*  --   --  9.3 9.5  MG 1.9  --  1.9  --  1.8  --   --   --   --   PHOS 2.7  --  2.4*  --  2.4*  --   --   --   --    < > = values in this interval not displayed.    ABG: Recent Labs  Lab 10/31/20 0527 10/31/20 0527  10/31/20 0622 10/31/20 0646 10/31/20 0834 11/01/20 2130 11/02/20 0230  PHART 7.140*   < > 7.170* 7.292* 7.386 7.354 7.372  PCO2ART >120*   < > >97.0* >97.0* 66.9* 69.9* 66.0*  PO2ART 344*   < > 448* 142* 133* 91.2 105  HCO3 45.0*  --   --   --  39.5* 38.2* 37.6*  O2SAT 99.6  --   --   --  99.4 97.5 98.3   < > = values in this interval not displayed.    Liver Function Tests: No results for input(s): AST, ALT, ALKPHOS, BILITOT, PROT, ALBUMIN in the last 168 hours. No results for input(s): LIPASE, AMYLASE in the last 168 hours. No results for input(s): AMMONIA in the last 168 hours.  CBC: Recent Labs  Lab 10/28/20 0023 10/28/20 0023 10/29/20 0214 10/29/20 0214 10/30/20 0534 10/31/20 0622 10/31/20 0646 11/01/20 1859 11/03/20 0420  WBC 9.2  --  6.9  --  8.6  --   --  19.8* 14.9*  NEUTROABS  --   --   --   --   --   --   --  18.5*  --   HGB 7.6*   < > 8.1*   < > 8.5* 9.9* 9.2* 9.7* 9.8*  HCT 24.1*   < > 26.1*   < > 27.0* 29.0* 27.0* 30.3* 29.7*  MCV 96.8  --  98.9  --  100.0  --   --  97.4 96.1  PLT 183  --  175  --  175  --   --  215 186   < > = values in this interval not displayed.    Cardiac Enzymes: No results for input(s): CKTOTAL, CKMB, CKMBINDEX, TROPONINI in the last 168 hours.  BNP (last 3 results) No results for input(s): BNP in the last 8760 hours.  ProBNP (last 3 results) No results for input(s): PROBNP in the last 8760 hours.  Radiological Exams: DG ABDOMEN PEG TUBE LOCATION  Result Date: 11/01/2020 CLINICAL DATA:  Peg tube placement EXAM: ABDOMEN - 1 VIEW COMPARISON:  None. FINDINGS: Contrast was injected through a gastrostomy catheter with contrast flowing into the stomach. No contrast extravasation. Contrast flows from the stomach into the duodenum. There is no bowel dilatation or air-fluid level to suggest bowel obstruction. No free air. IMPRESSION: Gastrostomy catheter positioned in the stomach. No contrast extravasation. Contrast flows from stomach  and duodenum. No bowel obstruction or free air evident. Electronically Signed   By: Bretta Bang III M.D.   On: 11/01/2020 19:07   DG CHEST PORT 1 VIEW  Result Date: 11/01/2020 CLINICAL DATA:  Tracheostomy placement EXAM: PORTABLE CHEST 1 VIEW COMPARISON:  October 31, 2020 FINDINGS: Tracheostomy catheter tip is 3.1 cm above the carina. Central catheter tip is in the superior vena cava. Pneumothorax on the right noted 1 day prior has virtually completely resolved with only minimal residual pneumothorax on the right. Patchy airspace opacity throughout the lungs remains. No new opacity evident. No consolidation. Heart size and pulmonary vascularity are normal. No adenopathy. No bone lesions. IMPRESSION: Tube and catheter positions as described. Minimal right pneumothorax. Patchy airspace opacity bilaterally likely represents multifocal pneumonia. No airspace consolidation evident. Stable cardiac silhouette. Electronically Signed   By: Bretta Bang III M.D.   On: 11/01/2020 19:09    Assessment/Plan Active Problems:   Acute on chronic respiratory failure with hypoxia (HCC)   COVID-19 virus infection   Pneumonia due to COVID-19 virus   Tracheostomy complication (HCC)   1. Acute on chronic respiratory failure with hypoxia patient is right now on pressure support goal of up to 4 hours 2. COVID-19 virus infection in recovery we will continue to follow 3. Pneumonia due to COVID-19 treated 4. Tracheostomy remains in place we will continue to follow along   I have personally seen and evaluated the patient, evaluated laboratory and imaging results, formulated the assessment and plan and placed orders. The Patient requires high complexity decision making with multiple systems involvement.  Rounds were done with the Respiratory Therapy Director and Staff therapists and discussed with nursing staff also.  Yevonne Pax, MD [facility] Pulmonary Critical Care Medicine Sleep Medicine

## 2020-11-03 NOTE — Progress Notes (Addendum)
Pulmonary Critical Care Medicine [facility] [place]   PULMONARY CRITICAL CARE SERVICE  PROGRESS NOTE  Date of Service: 11/02/2020  Lawrence Freeman  UMP:[id number]  DOB: 1971/06/18   DOA: 11/01/2020  Referring Physician: Carron Curie, MD  HPI: Lawrence Freeman is a 49 y.o. male seen for follow up of Acute on Chronic Respiratory Failure.  Patient is back from the ICU has been on pressure control mode and currently is on 40% FiO2.  Good saturations are noted no sign of active bleeding  Medications: Reviewed on Rounds  Physical Exam:  Vitals: Temperature is 97.2 pulse 108 respiratory rate 30 blood pressure is 146/90 saturations 99%  Ventilator Settings on pressure assist control FiO2 40% IP 20 PEEP 5  . General: Comfortable at this time . Eyes: Grossly normal lids, irises & conjunctiva . ENT: grossly tongue is normal . Neck: no obvious mass . Cardiovascular: S1 S2 normal no gallop . Respiratory: No rhonchi no rales are noted at this time . Abdomen: soft . Skin: no rash seen on limited exam . Musculoskeletal: not rigid . Psychiatric:unable to assess . Neurologic: no seizure no involuntary movements         Lab Data:   Basic Metabolic Panel: Recent Labs  Lab 10/28/20 0023 10/28/20 0023 10/29/20 0214 10/29/20 0214 10/30/20 0534 10/31/20 0622 10/31/20 0646 11/02/20 0428 11/03/20 0420  NA 141   < > 142   < > 144 140 139 139 138  K 4.6   < > 3.7   < > 4.2 4.8 5.0 5.1 4.5  CL 95*  --  92*  --  95*  --   --  93* 93*  CO2 36*  --  41*  --  38*  --   --  34* 31  GLUCOSE 306*  --  194*  --  154*  --   --  179* 102*  BUN 38*  --  37*  --  32*  --   --  49* 46*  CREATININE 0.61  --  0.54*  --  0.49*  --   --  0.67 0.49*  CALCIUM 8.5*  --  8.2*  --  8.1*  --   --  9.3 9.5  MG 1.9  --  1.9  --  1.8  --   --   --   --   PHOS 2.7  --  2.4*  --  2.4*  --   --   --   --    < > = values in this interval not displayed.    ABG: Recent Labs  Lab  10/31/20 0527 10/31/20 0527 10/31/20 0622 10/31/20 0646 10/31/20 0834 11/01/20 2130 11/02/20 0230  PHART 7.140*   < > 7.170* 7.292* 7.386 7.354 7.372  PCO2ART >120*   < > >97.0* >97.0* 66.9* 69.9* 66.0*  PO2ART 344*   < > 448* 142* 133* 91.2 105  HCO3 45.0*  --   --   --  39.5* 38.2* 37.6*  O2SAT 99.6  --   --   --  99.4 97.5 98.3   < > = values in this interval not displayed.    Liver Function Tests: No results for input(s): AST, ALT, ALKPHOS, BILITOT, PROT, ALBUMIN in the last 168 hours. No results for input(s): LIPASE, AMYLASE in the last 168 hours. No results for input(s): AMMONIA in the last 168 hours.  CBC: Recent Labs  Lab 10/28/20 0023 10/28/20 0023 10/29/20 0214 10/29/20 0214 10/30/20 0534 10/31/20 0622 10/31/20  1287 11/01/20 1859 11/03/20 0420  WBC 9.2  --  6.9  --  8.6  --   --  19.8* 14.9*  NEUTROABS  --   --   --   --   --   --   --  18.5*  --   HGB 7.6*   < > 8.1*   < > 8.5* 9.9* 9.2* 9.7* 9.8*  HCT 24.1*   < > 26.1*   < > 27.0* 29.0* 27.0* 30.3* 29.7*  MCV 96.8  --  98.9  --  100.0  --   --  97.4 96.1  PLT 183  --  175  --  175  --   --  215 186   < > = values in this interval not displayed.    Cardiac Enzymes: No results for input(s): CKTOTAL, CKMB, CKMBINDEX, TROPONINI in the last 168 hours.  BNP (last 3 results) No results for input(s): BNP in the last 8760 hours.  ProBNP (last 3 results) No results for input(s): PROBNP in the last 8760 hours.  Radiological Exams: DG ABDOMEN PEG TUBE LOCATION  Result Date: 11/01/2020 CLINICAL DATA:  Peg tube placement EXAM: ABDOMEN - 1 VIEW COMPARISON:  None. FINDINGS: Contrast was injected through a gastrostomy catheter with contrast flowing into the stomach. No contrast extravasation. Contrast flows from the stomach into the duodenum. There is no bowel dilatation or air-fluid level to suggest bowel obstruction. No free air. IMPRESSION: Gastrostomy catheter positioned in the stomach. No contrast extravasation.  Contrast flows from stomach and duodenum. No bowel obstruction or free air evident. Electronically Signed   By: Bretta Bang III M.D.   On: 11/01/2020 19:07   DG CHEST PORT 1 VIEW  Result Date: 11/01/2020 CLINICAL DATA:  Tracheostomy placement EXAM: PORTABLE CHEST 1 VIEW COMPARISON:  October 31, 2020 FINDINGS: Tracheostomy catheter tip is 3.1 cm above the carina. Central catheter tip is in the superior vena cava. Pneumothorax on the right noted 1 day prior has virtually completely resolved with only minimal residual pneumothorax on the right. Patchy airspace opacity throughout the lungs remains. No new opacity evident. No consolidation. Heart size and pulmonary vascularity are normal. No adenopathy. No bone lesions. IMPRESSION: Tube and catheter positions as described. Minimal right pneumothorax. Patchy airspace opacity bilaterally likely represents multifocal pneumonia. No airspace consolidation evident. Stable cardiac silhouette. Electronically Signed   By: Bretta Bang III M.D.   On: 11/01/2020 19:09    Assessment/Plan Active Problems:   Acute on chronic respiratory failure with hypoxia (HCC)   COVID-19 virus infection   Pneumonia due to COVID-19 virus   Tracheostomy complication (HCC)   1. Acute on chronic respiratory failure with hypoxia patient is back on the ventilator on assist control mode pressure control has been on 40% FiO2 with good saturations. 2. COVID-19 virus infection in recovery we will continue to with supportive care 3. Pneumonia due to COVID-19 treated 4. Tracheostomy appears to be a no bleeding noted continue to follow   I have personally seen and evaluated the patient, evaluated laboratory and imaging results, formulated the assessment and plan and placed orders. The Patient requires high complexity decision making with multiple systems involvement.  Rounds were done with the Respiratory Therapy Director and Staff therapists and discussed with nursing staff  also.  Yevonne Pax, MD [facility] Pulmonary Critical Care Medicine Sleep Medicine

## 2020-11-04 DIAGNOSIS — J9500 Unspecified tracheostomy complication: Secondary | ICD-10-CM | POA: Diagnosis not present

## 2020-11-04 DIAGNOSIS — U071 COVID-19: Secondary | ICD-10-CM | POA: Diagnosis not present

## 2020-11-04 DIAGNOSIS — J1282 Pneumonia due to coronavirus disease 2019: Secondary | ICD-10-CM | POA: Diagnosis not present

## 2020-11-04 DIAGNOSIS — J9621 Acute and chronic respiratory failure with hypoxia: Secondary | ICD-10-CM | POA: Diagnosis not present

## 2020-11-04 NOTE — Progress Notes (Signed)
Pulmonary Critical Care Medicine [facility] [place]   PULMONARY CRITICAL CARE SERVICE  PROGRESS NOTE  Date of Service: 11/04/2020  Lawrence Freeman  TDV:[id number]  DOB: 03-Oct-1971   DOA: 11/01/2020  Referring Physician: Carron Curie, MD  HPI: Lawrence Freeman is a 49 y.o. male seen for follow up of Acute on Chronic Respiratory Failure.  Patient at this time is on pressure assist control mode has been doing about 4-hour goal of pressure support yesterday today will be up to 8 hours  Medications: Reviewed on Rounds  Physical Exam:  Vitals: Temperature is 97.6 pulse 106 respiratory rate 27 blood pressure is 134/67 saturations 97%  Ventilator Settings on pressure assist control FiO2 40% PEEP 5 tidal volume 460  . General: Comfortable at this time . Eyes: Grossly normal lids, irises & conjunctiva . ENT: grossly tongue is normal . Neck: no obvious mass . Cardiovascular: S1 S2 normal no gallop . Respiratory: No rhonchi no rales . Abdomen: soft . Skin: no rash seen on limited exam . Musculoskeletal: not rigid . Psychiatric:unable to assess . Neurologic: no seizure no involuntary movements         Lab Data:   Basic Metabolic Panel: Recent Labs  Lab 10/29/20 0214 10/29/20 0214 10/30/20 0534 10/31/20 0622 10/31/20 0646 11/02/20 0428 11/03/20 0420  NA 142   < > 144 140 139 139 138  K 3.7   < > 4.2 4.8 5.0 5.1 4.5  CL 92*  --  95*  --   --  93* 93*  CO2 41*  --  38*  --   --  34* 31  GLUCOSE 194*  --  154*  --   --  179* 102*  BUN 37*  --  32*  --   --  49* 46*  CREATININE 0.54*  --  0.49*  --   --  0.67 0.49*  CALCIUM 8.2*  --  8.1*  --   --  9.3 9.5  MG 1.9  --  1.8  --   --   --   --   PHOS 2.4*  --  2.4*  --   --   --   --    < > = values in this interval not displayed.    ABG: Recent Labs  Lab 10/31/20 0527 10/31/20 0527 10/31/20 0622 10/31/20 0646 10/31/20 0834 11/01/20 2130 11/02/20 0230  PHART 7.140*   < > 7.170* 7.292* 7.386  7.354 7.372  PCO2ART >120*   < > >97.0* >97.0* 66.9* 69.9* 66.0*  PO2ART 344*   < > 448* 142* 133* 91.2 105  HCO3 45.0*  --   --   --  39.5* 38.2* 37.6*  O2SAT 99.6  --   --   --  99.4 97.5 98.3   < > = values in this interval not displayed.    Liver Function Tests: No results for input(s): AST, ALT, ALKPHOS, BILITOT, PROT, ALBUMIN in the last 168 hours. No results for input(s): LIPASE, AMYLASE in the last 168 hours. No results for input(s): AMMONIA in the last 168 hours.  CBC: Recent Labs  Lab 10/29/20 0214 10/29/20 0214 10/30/20 0534 10/31/20 0622 10/31/20 0646 11/01/20 1859 11/03/20 0420  WBC 6.9  --  8.6  --   --  19.8* 14.9*  NEUTROABS  --   --   --   --   --  18.5*  --   HGB 8.1*   < > 8.5* 9.9* 9.2* 9.7* 9.8*  HCT  26.1*   < > 27.0* 29.0* 27.0* 30.3* 29.7*  MCV 98.9  --  100.0  --   --  97.4 96.1  PLT 175  --  175  --   --  215 186   < > = values in this interval not displayed.    Cardiac Enzymes: No results for input(s): CKTOTAL, CKMB, CKMBINDEX, TROPONINI in the last 168 hours.  BNP (last 3 results) No results for input(s): BNP in the last 8760 hours.  ProBNP (last 3 results) No results for input(s): PROBNP in the last 8760 hours.  Radiological Exams: No results found.  Assessment/Plan Active Problems:   Acute on chronic respiratory failure with hypoxia (HCC)   COVID-19 virus infection   Pneumonia due to COVID-19 virus   Tracheostomy complication (HCC)   1. Acute on chronic respiratory failure hypoxia plan is to wean for 6 hours up to 8 hours on pressure support 2. COVID-19 virus infection in recovery we will continue to follow 3. Pneumonia due to COVID-19 treated slow improvement 4. Tracheostomy bleed no further bleeding has been noted.   I have personally seen and evaluated the patient, evaluated laboratory and imaging results, formulated the assessment and plan and placed orders. The Patient requires high complexity decision making with multiple  systems involvement.  Rounds were done with the Respiratory Therapy Director and Staff therapists and discussed with nursing staff also.  Yevonne Pax, MD [facility] Pulmonary Critical Care Medicine Sleep Medicine

## 2020-11-04 NOTE — Consult Note (Signed)
Ref: Gunnar Bulla, MD   Subjective:  Bradycardia during physical therapy in upright position and lying down lasting for few seconds.. No Beta-blocker or calcium channel blocker use.  Objective:  Vital Signs in the last 24 hours:  P: 106 to 120/min.  R: 30 BP: 101/62 O2 sat: 97 %  On 40 % FiO2 on pressure support.  Physical Exam: BP Readings from Last 1 Encounters:  11/01/20 119/88     Wt Readings from Last 1 Encounters:  11/01/20 68 kg    Weight change:  There is no height or weight on file to calculate BMI. HEENT: [place]/AT, Eyes-Blue, PERL, EOMI, Conjunctiva-Pale pink, Sclera-Non-icteric Neck: No JVD, No bruit, Trachea midline. Lungs:  Rhonchi, Bilateral. Cardiac:  Regular rhythm, normal S1 and S2, no S3. II/VI systolic murmur. Abdomen:  Soft, non-tender. BS present. Extremities:  No edema present. No cyanosis. No clubbing. CNS: AxOx1, Cranial nerves grossly intact, moves all 4 extremities.  Skin: Warm and dry.   Intake/Output from previous day: No intake/output data recorded.    Lab Results: BMET    Component Value Date/Time   NA 138 11/03/2020 0420   NA 139 11/02/2020 0428   NA 139 10/31/2020 0646   K 4.5 11/03/2020 0420   K 5.1 11/02/2020 0428   K 5.0 10/31/2020 0646   CL 93 (L) 11/03/2020 0420   CL 93 (L) 11/02/2020 0428   CL 95 (L) 10/30/2020 0534   CO2 31 11/03/2020 0420   CO2 34 (H) 11/02/2020 0428   CO2 38 (H) 10/30/2020 0534   GLUCOSE 102 (H) 11/03/2020 0420   GLUCOSE 179 (H) 11/02/2020 0428   GLUCOSE 154 (H) 10/30/2020 0534   BUN 46 (H) 11/03/2020 0420   BUN 49 (H) 11/02/2020 0428   BUN 32 (H) 10/30/2020 0534   CREATININE 0.49 (L) 11/03/2020 0420   CREATININE 0.67 11/02/2020 0428   CREATININE 0.49 (L) 10/30/2020 0534   CALCIUM 9.5 11/03/2020 0420   CALCIUM 9.3 11/02/2020 0428   CALCIUM 8.1 (L) 10/30/2020 0534   GFRNONAA >60 11/03/2020 0420   GFRNONAA >60 11/02/2020 0428   GFRNONAA >60 10/30/2020 0534   CBC    Component Value Date/Time    WBC 14.9 (H) 11/03/2020 0420   RBC 3.09 (L) 11/03/2020 0420   HGB 9.8 (L) 11/03/2020 0420   HCT 29.7 (L) 11/03/2020 0420   PLT 186 11/03/2020 0420   MCV 96.1 11/03/2020 0420   MCH 31.7 11/03/2020 0420   MCHC 33.0 11/03/2020 0420   RDW 18.6 (H) 11/03/2020 0420   LYMPHSABS 0.6 (L) 11/01/2020 1859   MONOABS 0.5 11/01/2020 1859   EOSABS 0.0 11/01/2020 1859   BASOSABS 0.0 11/01/2020 1859   HEPATIC Function Panel Recent Labs    10/12/20 0554 10/13/20 0928 10/23/20 1823  PROT 6.4* 6.7 4.7*   HEMOGLOBIN A1C No components found for: HGA1C,  MPG CARDIAC ENZYMES No results found for: CKTOTAL, CKMB, CKMBINDEX, TROPONINI BNP No results for input(s): PROBNP in the last 8760 hours. TSH No results for input(s): TSH in the last 8760 hours. CHOLESTEROL No results for input(s): CHOL in the last 8760 hours.  Scheduled Meds: Continuous Infusions: PRN Meds:.  Assessment/Plan: Acute on chronic respiratory failure with hypoxia COVID-19 pneumonia ARDS Klebsiella pneumonia CAD S/P stent placement Bradycardia, vasovagal Dehydration/Moderate protein calorie malnutrition  Increase oral intake. Hypotension due to malnutrition, dehydration and vagal inhibition. Resume physical therapy after 24-48 hours.    LOS: 0 days   Time spent including chart review, lab review, examination, discussion  with patient :  min   Orpah Cobb  MD  11/04/2020, 1:14 PM

## 2020-11-05 DIAGNOSIS — J9621 Acute and chronic respiratory failure with hypoxia: Secondary | ICD-10-CM | POA: Diagnosis not present

## 2020-11-05 DIAGNOSIS — J9500 Unspecified tracheostomy complication: Secondary | ICD-10-CM | POA: Diagnosis not present

## 2020-11-05 DIAGNOSIS — J1282 Pneumonia due to coronavirus disease 2019: Secondary | ICD-10-CM | POA: Diagnosis not present

## 2020-11-05 DIAGNOSIS — U071 COVID-19: Secondary | ICD-10-CM | POA: Diagnosis not present

## 2020-11-05 NOTE — Consult Note (Signed)
Ref: Gunnar Bulla, MD   Subjective:  Awake, watching TV. Respiratory distress persist. Sinus rhythm on monitor.  Objective:  Vital Signs in the last 24 hours:  P: 107, R: 27, BP: 114/74, FiO2 50 %, TV 516, IP 28 and PEEP 5 with 100 % O2 sat.  Physical Exam: BP Readings from Last 1 Encounters:  11/01/20 119/88     Wt Readings from Last 1 Encounters:  11/01/20 68 kg    Weight change:  There is no height or weight on file to calculate BMI. HEENT: [place]/AT, Eyes-Blue, Conjunctiva-Pale pink, Sclera-Non-icteric Neck: No JVD, No bruit, Trachea midline. Lungs:  Clearing, Bilateral. Cardiac:  Tachycardic, Regular rhythm, normal S1 and S2, no S3. II/VI systolic murmur. Abdomen:  Soft, non-tender. BS present. Extremities:  No edema present. No cyanosis. No clubbing. CNS: AxOx1, Cranial nerves grossly intact, moves all 4 extremities very little.  Skin: Warm and dry.   Intake/Output from previous day: No intake/output data recorded.    Lab Results: BMET    Component Value Date/Time   NA 138 11/03/2020 0420   NA 139 11/02/2020 0428   NA 139 10/31/2020 0646   K 4.5 11/03/2020 0420   K 5.1 11/02/2020 0428   K 5.0 10/31/2020 0646   CL 93 (L) 11/03/2020 0420   CL 93 (L) 11/02/2020 0428   CL 95 (L) 10/30/2020 0534   CO2 31 11/03/2020 0420   CO2 34 (H) 11/02/2020 0428   CO2 38 (H) 10/30/2020 0534   GLUCOSE 102 (H) 11/03/2020 0420   GLUCOSE 179 (H) 11/02/2020 0428   GLUCOSE 154 (H) 10/30/2020 0534   BUN 46 (H) 11/03/2020 0420   BUN 49 (H) 11/02/2020 0428   BUN 32 (H) 10/30/2020 0534   CREATININE 0.49 (L) 11/03/2020 0420   CREATININE 0.67 11/02/2020 0428   CREATININE 0.49 (L) 10/30/2020 0534   CALCIUM 9.5 11/03/2020 0420   CALCIUM 9.3 11/02/2020 0428   CALCIUM 8.1 (L) 10/30/2020 0534   GFRNONAA >60 11/03/2020 0420   GFRNONAA >60 11/02/2020 0428   GFRNONAA >60 10/30/2020 0534   CBC    Component Value Date/Time   WBC 14.9 (H) 11/03/2020 0420   RBC 3.09 (L) 11/03/2020 0420    HGB 9.8 (L) 11/03/2020 0420   HCT 29.7 (L) 11/03/2020 0420   PLT 186 11/03/2020 0420   MCV 96.1 11/03/2020 0420   MCH 31.7 11/03/2020 0420   MCHC 33.0 11/03/2020 0420   RDW 18.6 (H) 11/03/2020 0420   LYMPHSABS 0.6 (L) 11/01/2020 1859   MONOABS 0.5 11/01/2020 1859   EOSABS 0.0 11/01/2020 1859   BASOSABS 0.0 11/01/2020 1859   HEPATIC Function Panel Recent Labs    10/12/20 0554 10/13/20 0928 10/23/20 1823  PROT 6.4* 6.7 4.7*   HEMOGLOBIN A1C No components found for: HGA1C,  MPG CARDIAC ENZYMES No results found for: CKTOTAL, CKMB, CKMBINDEX, TROPONINI BNP No results for input(s): PROBNP in the last 8760 hours. TSH No results for input(s): TSH in the last 8760 hours. CHOLESTEROL No results for input(s): CHOL in the last 8760 hours.  Scheduled Meds: Continuous Infusions: PRN Meds:.  Assessment/Plan: Acute on chronic respiratory failure with hypoxia COVID-19 pneumonia ARDS Klebsiella pneumonia CAD S/P stent placement Bradycardia, vasovagal Dehydration Moderate protein calorie malnutrition  Improving heart rate and BP with increasing nutrition.   LOS: 0 days   Time spent including chart review, lab review, examination, discussion with patient/nurse/PT : 30 min   Orpah Cobb  MD  11/05/2020, 11:26 AM

## 2020-11-05 NOTE — Progress Notes (Signed)
Pulmonary Critical Care Medicine [facility] [place]   PULMONARY CRITICAL CARE SERVICE  PROGRESS NOTE  Date of Service: 11/05/2020  Lawrence Freeman  PNT:[id number]  DOB: [birth year]   DOA: 11/01/2020  Referring Physician: Carron Curie, MD  HPI: Lawrence Freeman is a 49 y.o. male seen for follow up of Acute on Chronic Respiratory Failure.  Patient reportedly has been having multiple bradycardiac issues was seen by cardiology felt to be vasovagal  Medications: Reviewed on Rounds  Physical Exam:  Vitals: Temperature is 96.5 pulse 118 respiratory rate 20 blood pressure 93/59 saturations 99%  Ventilator Settings on pressure assist control FiO2 is 50% tidal volume 516 IP 28 PEEP 5  . General: Comfortable at this time . Eyes: Grossly normal lids, irises & conjunctiva . ENT: grossly tongue is normal . Neck: no obvious mass . Cardiovascular: S1 S2 normal no gallop . Respiratory: No rhonchi no rales noted at this time. . Abdomen: soft . Skin: no rash seen on limited exam . Musculoskeletal: not rigid . Psychiatric:unable to assess . Neurologic: no seizure no involuntary movements         Lab Data:   Basic Metabolic Panel: Recent Labs  Lab 10/30/20 0534 10/31/20 0622 10/31/20 0646 11/02/20 0428 11/03/20 0420  NA 144 140 139 139 138  K 4.2 4.8 5.0 5.1 4.5  CL 95*  --   --  93* 93*  CO2 38*  --   --  34* 31  GLUCOSE 154*  --   --  179* 102*  BUN 32*  --   --  49* 46*  CREATININE 0.49*  --   --  0.67 0.49*  CALCIUM 8.1*  --   --  9.3 9.5  MG 1.8  --   --   --   --   PHOS 2.4*  --   --   --   --     ABG: Recent Labs  Lab 10/31/20 0527 10/31/20 0527 10/31/20 0622 10/31/20 0646 10/31/20 0834 11/01/20 2130 11/02/20 0230  PHART 7.140*   < > 7.170* 7.292* 7.386 7.354 7.372  PCO2ART >120*   < > >97.0* >97.0* 66.9* 69.9* 66.0*  PO2ART 344*   < > 448* 142* 133* 91.2 105  HCO3 45.0*  --   --   --  39.5* 38.2* 37.6*  O2SAT 99.6  --   --   --  99.4  97.5 98.3   < > = values in this interval not displayed.    Liver Function Tests: No results for input(s): AST, ALT, ALKPHOS, BILITOT, PROT, ALBUMIN in the last 168 hours. No results for input(s): LIPASE, AMYLASE in the last 168 hours. No results for input(s): AMMONIA in the last 168 hours.  CBC: Recent Labs  Lab 10/30/20 0534 10/31/20 0622 10/31/20 0646 11/01/20 1859 11/03/20 0420  WBC 8.6  --   --  19.8* 14.9*  NEUTROABS  --   --   --  18.5*  --   HGB 8.5* 9.9* 9.2* 9.7* 9.8*  HCT 27.0* 29.0* 27.0* 30.3* 29.7*  MCV 100.0  --   --  97.4 96.1  PLT 175  --   --  215 186    Cardiac Enzymes: No results for input(s): CKTOTAL, CKMB, CKMBINDEX, TROPONINI in the last 168 hours.  BNP (last 3 results) No results for input(s): BNP in the last 8760 hours.  ProBNP (last 3 results) No results for input(s): PROBNP in the last 8760 hours.  Radiological Exams: No results found.  Assessment/Plan Active Problems:   Acute on chronic respiratory failure with hypoxia (HCC)   COVID-19 virus infection   Pneumonia due to COVID-19 virus   Tracheostomy complication (HCC)   1. Acute on chronic respiratory failure hypoxia we will continue with full support on the ventilator in light of the issues with bradycardia. 2. COVID-19 virus infection in recovery we will continue to follow 3. Pneumonia due to COVID-19 treated slowly. 4. Tracheostomy will remain in place no bleeding is noted   I have personally seen and evaluated the patient, evaluated laboratory and imaging results, formulated the assessment and plan and placed orders. The Patient requires high complexity decision making with multiple systems involvement.  Rounds were done with the Respiratory Therapy Director and Staff therapists and discussed with nursing staff also.  Yevonne Pax, MD [facility] Pulmonary Critical Care Medicine Sleep Medicine

## 2020-11-06 ENCOUNTER — Other Ambulatory Visit (HOSPITAL_COMMUNITY): Payer: Self-pay

## 2020-11-06 DIAGNOSIS — J9621 Acute and chronic respiratory failure with hypoxia: Secondary | ICD-10-CM | POA: Diagnosis not present

## 2020-11-06 DIAGNOSIS — U071 COVID-19: Secondary | ICD-10-CM | POA: Diagnosis not present

## 2020-11-06 DIAGNOSIS — J9500 Unspecified tracheostomy complication: Secondary | ICD-10-CM | POA: Diagnosis not present

## 2020-11-06 DIAGNOSIS — J1282 Pneumonia due to coronavirus disease 2019: Secondary | ICD-10-CM | POA: Diagnosis not present

## 2020-11-06 LAB — CBC
HCT: 27.9 % — ABNORMAL LOW (ref 39.0–52.0)
Hemoglobin: 9 g/dL — ABNORMAL LOW (ref 13.0–17.0)
MCH: 31.3 pg (ref 26.0–34.0)
MCHC: 32.3 g/dL (ref 30.0–36.0)
MCV: 96.9 fL (ref 80.0–100.0)
Platelets: 181 10*3/uL (ref 150–400)
RBC: 2.88 MIL/uL — ABNORMAL LOW (ref 4.22–5.81)
RDW: 19.1 % — ABNORMAL HIGH (ref 11.5–15.5)
WBC: 15.2 10*3/uL — ABNORMAL HIGH (ref 4.0–10.5)
nRBC: 0 % (ref 0.0–0.2)

## 2020-11-06 LAB — COMPREHENSIVE METABOLIC PANEL
ALT: 42 U/L (ref 0–44)
AST: 21 U/L (ref 15–41)
Albumin: 3.1 g/dL — ABNORMAL LOW (ref 3.5–5.0)
Alkaline Phosphatase: 119 U/L (ref 38–126)
Anion gap: 9 (ref 5–15)
BUN: 50 mg/dL — ABNORMAL HIGH (ref 6–20)
CO2: 36 mmol/L — ABNORMAL HIGH (ref 22–32)
Calcium: 9.1 mg/dL (ref 8.9–10.3)
Chloride: 88 mmol/L — ABNORMAL LOW (ref 98–111)
Creatinine, Ser: 0.64 mg/dL (ref 0.61–1.24)
GFR, Estimated: >60 mL/min (ref >60)
Glucose, Bld: 241 mg/dL — ABNORMAL HIGH (ref 70–99)
Potassium: 5 mmol/L (ref 3.5–5.1)
Sodium: 133 mmol/L — ABNORMAL LOW (ref 135–145)
Total Bilirubin: 0.5 mg/dL (ref 0.3–1.2)
Total Protein: 5.8 g/dL — ABNORMAL LOW (ref 6.5–8.1)

## 2020-11-06 NOTE — Consult Note (Signed)
Ref: Gunnar Bulla, MD   Subjective:  Awake. No episodes of bradycardia or hypotension.  Objective:  Vital Signs in the last 24 hours:  P: 106, R: 35, BP: 104/63, O2 sat 99 % on 35 % FiO2.  Physical Exam: BP Readings from Last 1 Encounters:  11/01/20 119/88     Wt Readings from Last 1 Encounters:  11/01/20 68 kg    Weight change:  There is no height or weight on file to calculate BMI. HEENT: /AT, Eyes-Blue, Conjunctiva-Pale pink, Sclera-Non-icteric Neck: No JVD, No bruit, Tracheostomy. Lungs:  Coarse crackles, Bilateral. Cardiac:  Tachycardic, normal S1 and S2, no S3. II/VI systolic murmur. Abdomen:  Soft, non-tender. BS present. Extremities:  No edema present. No cyanosis. No clubbing. CNS: AxOx1, Cranial nerves grossly intact, moves all 4 extremities very little.  Skin: Warm and dry.   Intake/Output from previous day: No intake/output data recorded.    Lab Results: BMET    Component Value Date/Time   NA 133 (L) 11/06/2020 0503   NA 138 11/03/2020 0420   NA 139 11/02/2020 0428   K 5.0 11/06/2020 0503   K 4.5 11/03/2020 0420   K 5.1 11/02/2020 0428   CL 88 (L) 11/06/2020 0503   CL 93 (L) 11/03/2020 0420   CL 93 (L) 11/02/2020 0428   CO2 36 (H) 11/06/2020 0503   CO2 31 11/03/2020 0420   CO2 34 (H) 11/02/2020 0428   GLUCOSE 241 (H) 11/06/2020 0503   GLUCOSE 102 (H) 11/03/2020 0420   GLUCOSE 179 (H) 11/02/2020 0428   BUN 50 (H) 11/06/2020 0503   BUN 46 (H) 11/03/2020 0420   BUN 49 (H) 11/02/2020 0428   CREATININE 0.64 11/06/2020 0503   CREATININE 0.49 (L) 11/03/2020 0420   CREATININE 0.67 11/02/2020 0428   CALCIUM 9.1 11/06/2020 0503   CALCIUM 9.5 11/03/2020 0420   CALCIUM 9.3 11/02/2020 0428   GFRNONAA >60 11/06/2020 0503   GFRNONAA >60 11/03/2020 0420   GFRNONAA >60 11/02/2020 0428   CBC    Component Value Date/Time   WBC 15.2 (H) 11/06/2020 0503   RBC 2.88 (L) 11/06/2020 0503   HGB 9.0 (L) 11/06/2020 0503   HCT 27.9 (L) 11/06/2020 0503   PLT  181 11/06/2020 0503   MCV 96.9 11/06/2020 0503   MCH 31.3 11/06/2020 0503   MCHC 32.3 11/06/2020 0503   RDW 19.1 (H) 11/06/2020 0503   LYMPHSABS 0.6 (L) 11/01/2020 1859   MONOABS 0.5 11/01/2020 1859   EOSABS 0.0 11/01/2020 1859   BASOSABS 0.0 11/01/2020 1859   HEPATIC Function Panel Recent Labs    10/13/20 0928 10/23/20 1823 11/06/20 0503  PROT 6.7 4.7* 5.8*   HEMOGLOBIN A1C No components found for: HGA1C,  MPG CARDIAC ENZYMES No results found for: CKTOTAL, CKMB, CKMBINDEX, TROPONINI BNP No results for input(s): PROBNP in the last 8760 hours. TSH No results for input(s): TSH in the last 8760 hours. CHOLESTEROL No results for input(s): CHOL in the last 8760 hours.  Scheduled Meds: Continuous Infusions: PRN Meds:.  Assessment/Plan: Acute on chronic respiratory failure with hypoxia COVID-19 pneumonia Klebsiella pneumonia ARDS CAD S/P coronary stent placement Bradycardia, vasovagal Dehydration, improving Moderate protein calorie malnutrition  Continue medical treatment    LOS: 0 days   Time spent including chart review, lab review, examination, discussion with patient/Nurse : 25 min   Orpah Cobb  MD  11/06/2020, 12:55 PM

## 2020-11-06 NOTE — Progress Notes (Signed)
Pulmonary Critical Care Medicine [facility] [place]   PULMONARY CRITICAL CARE SERVICE  PROGRESS NOTE  Date of Service: 11/06/2020  Lawrence Freeman  [id number]:[id number]  DOB: 1971-01-02   DOA: 11/01/2020  Referring Physician: Carron Curie, MD  HPI: Lawrence Freeman is a 49 y.o. male seen for follow up of Acute on Chronic Respiratory Failure. Patient apparently had another episode of bradycardia unclear as to the precipitating events right now is in sinus rhythm  Medications: Reviewed on Rounds  Physical Exam:  Vitals: Temperature 96.8 pulse 94 respiratory 26 blood pressure is 104/63 saturations 100%  Ventilator Settings on pressure assist control FiO2 is 45% tidal volume 413 IP 28 PEEP 5  . General: Comfortable at this time . Eyes: Grossly normal lids, irises & conjunctiva . ENT: grossly tongue is normal . Neck: no obvious mass . Cardiovascular: S1 S2 normal no gallop . Respiratory: No rhonchi very coarse breath sounds . Abdomen: soft . Skin: no rash seen on limited exam . Musculoskeletal: not rigid . Psychiatric:unable to assess . Neurologic: no seizure no involuntary movements         Lab Data:   Basic Metabolic Panel: Recent Labs  Lab 10/31/20 0622 10/31/20 0646 11/02/20 0428 11/03/20 0420 11/06/20 0503  NA 140 139 139 138 133*  K 4.8 5.0 5.1 4.5 5.0  CL  --   --  93* 93* 88*  CO2  --   --  34* 31 36*  GLUCOSE  --   --  179* 102* 241*  BUN  --   --  49* 46* 50*  CREATININE  --   --  0.67 0.49* 0.64  CALCIUM  --   --  9.3 9.5 9.1    ABG: Recent Labs  Lab 10/31/20 0527 10/31/20 0527 10/31/20 0622 10/31/20 0646 10/31/20 0834 11/01/20 2130 11/02/20 0230  PHART 7.140*   < > 7.170* 7.292* 7.386 7.354 7.372  PCO2ART >120*   < > >97.0* >97.0* 66.9* 69.9* 66.0*  PO2ART 344*   < > 448* 142* 133* 91.2 105  HCO3 45.0*  --   --   --  39.5* 38.2* 37.6*  O2SAT 99.6  --   --   --  99.4 97.5 98.3   < > = values in this interval not displayed.     Liver Function Tests: Recent Labs  Lab 11/06/20 0503  AST 21  ALT 42  ALKPHOS 119  BILITOT 0.5  PROT 5.8*  ALBUMIN 3.1*   No results for input(s): LIPASE, AMYLASE in the last 168 hours. No results for input(s): AMMONIA in the last 168 hours.  CBC: Recent Labs  Lab 10/31/20 0622 10/31/20 0646 11/01/20 1859 11/03/20 0420 11/06/20 0503  WBC  --   --  19.8* 14.9* 15.2*  NEUTROABS  --   --  18.5*  --   --   HGB 9.9* 9.2* 9.7* 9.8* 9.0*  HCT 29.0* 27.0* 30.3* 29.7* 27.9*  MCV  --   --  97.4 96.1 96.9  PLT  --   --  215 186 181    Cardiac Enzymes: No results for input(s): CKTOTAL, CKMB, CKMBINDEX, TROPONINI in the last 168 hours.  BNP (last 3 results) No results for input(s): BNP in the last 8760 hours.  ProBNP (last 3 results) No results for input(s): PROBNP in the last 8760 hours.  Radiological Exams: No results found.  Assessment/Plan Active Problems:   Acute on chronic respiratory failure with hypoxia (HCC)   COVID-19 virus infection  Pneumonia due to COVID-19 virus   Tracheostomy complication (HCC)   1. Acute on chronic respiratory failure hypoxia holding off on weaning with the cardiac issues going on. Follow-up with cardiology recommendations 2. Bradycardia no witnessed episodes by myself however patient has had rates down into the 30s felt to be vasovagal per cardiology notes needs to continue to be monitored 3. COVID-19 virus infection recovery  4. pneumonia due to COVID-19 treated slow improvement 5. Tracheostomy remains in place no active bleeding   I have personally seen and evaluated the patient, evaluated laboratory and imaging results, formulated the assessment and plan and placed orders. The Patient requires high complexity decision making with multiple systems involvement.  Rounds were done with the Respiratory Therapy Director and Staff therapists and discussed with nursing staff also.  Yevonne Pax, MD [facility] Pulmonary Critical Care  Medicine Sleep Medicine

## 2020-11-07 ENCOUNTER — Other Ambulatory Visit (HOSPITAL_COMMUNITY): Payer: Self-pay

## 2020-11-07 DIAGNOSIS — Z431 Encounter for attention to gastrostomy: Secondary | ICD-10-CM

## 2020-11-07 DIAGNOSIS — J9500 Unspecified tracheostomy complication: Secondary | ICD-10-CM | POA: Diagnosis not present

## 2020-11-07 DIAGNOSIS — U071 COVID-19: Secondary | ICD-10-CM | POA: Diagnosis not present

## 2020-11-07 DIAGNOSIS — J9621 Acute and chronic respiratory failure with hypoxia: Secondary | ICD-10-CM | POA: Diagnosis not present

## 2020-11-07 LAB — CBC
HCT: 29.5 % — ABNORMAL LOW (ref 39.0–52.0)
Hemoglobin: 9.5 g/dL — ABNORMAL LOW (ref 13.0–17.0)
MCH: 32.2 pg (ref 26.0–34.0)
MCHC: 32.2 g/dL (ref 30.0–36.0)
MCV: 100 fL (ref 80.0–100.0)
Platelets: 283 10*3/uL (ref 150–400)
RBC: 2.95 MIL/uL — ABNORMAL LOW (ref 4.22–5.81)
RDW: 19 % — ABNORMAL HIGH (ref 11.5–15.5)
WBC: 23.8 10*3/uL — ABNORMAL HIGH (ref 4.0–10.5)
nRBC: 0 % (ref 0.0–0.2)

## 2020-11-07 LAB — BLOOD GAS, ARTERIAL
Acid-Base Excess: 11.6 mmol/L — ABNORMAL HIGH (ref 0.0–2.0)
Acid-Base Excess: 10.2 mmol/L — ABNORMAL HIGH (ref 0.0–2.0)
Bicarbonate: 40.5 mmol/L — ABNORMAL HIGH (ref 20.0–28.0)
Bicarbonate: 38.5 mmol/L — ABNORMAL HIGH (ref 20.0–28.0)
FIO2: 45
FIO2: 45
O2 Saturation: 97.1 %
O2 Saturation: 97.4 %
Patient temperature: 37
Patient temperature: 35.4
pCO2 arterial: >120 mmHg (ref 32.0–48.0)
pCO2 arterial: 99.5 mmHg (ref 32.0–48.0)
pH, Arterial: 7.146 — CL (ref 7.350–7.450)
pH, Arterial: 7.201 — ABNORMAL LOW (ref 7.350–7.450)
pO2, Arterial: 101 mmHg (ref 83.0–108.0)
pO2, Arterial: 89 mmHg (ref 83.0–108.0)

## 2020-11-07 LAB — BASIC METABOLIC PANEL
Anion gap: 8 (ref 5–15)
BUN: 46 mg/dL — ABNORMAL HIGH (ref 6–20)
CO2: 39 mmol/L — ABNORMAL HIGH (ref 22–32)
Calcium: 9.4 mg/dL (ref 8.9–10.3)
Chloride: 89 mmol/L — ABNORMAL LOW (ref 98–111)
Creatinine, Ser: 0.61 mg/dL (ref 0.61–1.24)
GFR, Estimated: >60 mL/min (ref >60)
Glucose, Bld: 266 mg/dL — ABNORMAL HIGH (ref 70–99)
Potassium: 4.7 mmol/L (ref 3.5–5.1)
Sodium: 136 mmol/L (ref 135–145)

## 2020-11-07 LAB — MAGNESIUM: Magnesium: 2 mg/dL (ref 1.7–2.4)

## 2020-11-07 NOTE — Progress Notes (Signed)
Pulmonary Critical Care Medicine [facility] [place]   PULMONARY CRITICAL CARE SERVICE  PROGRESS NOTE  Date of Service: 11/07/2020  Lawrence Freeman  ZOX:[id number]  DOB: 06-27-1971   DOA: 11/01/2020  Referring Physician: Carron Curie, MD  HPI: Lawrence Freeman is a 49 y.o. male seen for follow up of Acute on Chronic Respiratory Failure.  Patient this morning was noted to have increased work of breathing was tugging quite hard on the ventilator.  Ordered for an ABG to be done which showed pH 7.14 PCO2 was up to 120 in addition also I ordered a chest x-ray which results are noted below  Medications: Reviewed on Rounds  Physical Exam:  Vitals: Temperature is 96.8 pulse 106 respiratory rate 27 blood pressure is 157/89 saturations 98%  Ventilator Settings on pressure assist control FiO2 is 45% IP 28 PEEP 5  . General: Comfortable at this time . Eyes: Grossly normal lids, irises & conjunctiva . ENT: grossly tongue is normal . Neck: no obvious mass . Cardiovascular: S1 S2 normal no gallop . Respiratory: No rhonchi no rales are noted at this time . Abdomen: soft . Skin: no rash seen on limited exam . Musculoskeletal: not rigid . Psychiatric:unable to assess . Neurologic: no seizure no involuntary movements         Lab Data:   Basic Metabolic Panel: Recent Labs  Lab 11/02/20 0428 11/03/20 0420 11/06/20 0503 11/07/20 0702  NA 139 138 133* 136  K 5.1 4.5 5.0 4.7  CL 93* 93* 88* 89*  CO2 34* 31 36* 39*  GLUCOSE 179* 102* 241* 266*  BUN 49* 46* 50* 46*  CREATININE 0.67 0.49* 0.64 0.61  CALCIUM 9.3 9.5 9.1 9.4  MG  --   --   --  2.0    ABG: Recent Labs  Lab 11/01/20 2130 11/02/20 0230 11/07/20 0825  PHART 7.354 7.372 7.146*  PCO2ART 69.9* 66.0* >120*  PO2ART 91.2 105 101  HCO3 38.2* 37.6* 40.5*  O2SAT 97.5 98.3 97.1    Liver Function Tests: Recent Labs  Lab 11/06/20 0503  AST 21  ALT 42  ALKPHOS 119  BILITOT 0.5  PROT 5.8*  ALBUMIN  3.1*   No results for input(s): LIPASE, AMYLASE in the last 168 hours. No results for input(s): AMMONIA in the last 168 hours.  CBC: Recent Labs  Lab 11/01/20 1859 11/03/20 0420 11/06/20 0503 11/07/20 0702  WBC 19.8* 14.9* 15.2* 23.8*  NEUTROABS 18.5*  --   --   --   HGB 9.7* 9.8* 9.0* 9.5*  HCT 30.3* 29.7* 27.9* 29.5*  MCV 97.4 96.1 96.9 100.0  PLT 215 186 181 283    Cardiac Enzymes: No results for input(s): CKTOTAL, CKMB, CKMBINDEX, TROPONINI in the last 168 hours.  BNP (last 3 results) No results for input(s): BNP in the last 8760 hours.  ProBNP (last 3 results) No results for input(s): PROBNP in the last 8760 hours.  Radiological Exams: DG CHEST PORT 1 VIEW  Result Date: 11/07/2020 CLINICAL DATA:  Respiratory failure. EXAM: PORTABLE CHEST 1 VIEW COMPARISON:  11/06/2020 FINDINGS: Tracheostomy remains in place. Widespread hazy and patchy pulmonary infiltrates persist. No worsening. No dense consolidation, collapse or effusion. IMPRESSION: No change. Widespread hazy and patchy pulmonary infiltrates. Electronically Signed   By: Paulina Fusi M.D.   On: 11/07/2020 08:45   DG CHEST PORT 1 VIEW  Result Date: 11/06/2020 CLINICAL DATA:  Rhonchi EXAM: PORTABLE CHEST 1 VIEW COMPARISON:  November 01, 2020 FINDINGS: Tracheostomy catheter tip is  3.9 cm above the carina. Central catheter no longer evident. No appreciable pneumothorax. There is persistent patchy airspace opacity bilaterally without change. Heart size and pulmonary vascularity normal. No adenopathy. No bone lesions. IMPRESSION: Tracheostomy as described. No pneumothorax appreciable. Patchy airspace opacity bilaterally persists raising concern for potential degree of multifocal atypical organism pneumonia. There may be a degree of underlying fibrotic change as well. No new opacity evident. Stable cardiac silhouette. Electronically Signed   By: Bretta Bang III M.D.   On: 11/06/2020 13:31    Assessment/Plan Active  Problems:   Acute on chronic respiratory failure with hypoxia (HCC)   COVID-19 virus infection   Pneumonia due to COVID-19 virus   Tracheostomy complication (HCC)   1. Acute on chronic respiratory failure with hypoxia the patient chest x-ray results did not show any significant changes looks like the airway may be up against the posterior wall as for respiratory therapy to make an adjustment on the airway.  He has a Bivona trach in place.  Also will go up on his ventilatory support. 2. COVID-19 virus infection in recovery we will continue to monitor 3. Pneumonia due to COVID-19 slow improvement 4. Tracheostomy patient has had some issues with ventilating may very well be the Bivona trach itself we may have to revisit a rigid tube   I have personally seen and evaluated the patient, evaluated laboratory and imaging results, formulated the assessment and plan and placed orders. The Patient requires high complexity decision making with multiple systems involvement.  Rounds were done with the Respiratory Therapy Director and Staff therapists and discussed with nursing staff also.  Yevonne Pax, MD [facility] Pulmonary Critical Care Medicine Sleep Medicine

## 2020-11-08 DIAGNOSIS — J9500 Unspecified tracheostomy complication: Secondary | ICD-10-CM | POA: Diagnosis not present

## 2020-11-08 DIAGNOSIS — Z431 Encounter for attention to gastrostomy: Secondary | ICD-10-CM | POA: Diagnosis not present

## 2020-11-08 DIAGNOSIS — J9621 Acute and chronic respiratory failure with hypoxia: Secondary | ICD-10-CM | POA: Diagnosis not present

## 2020-11-08 DIAGNOSIS — J9620 Acute and chronic respiratory failure, unspecified whether with hypoxia or hypercapnia: Secondary | ICD-10-CM | POA: Diagnosis not present

## 2020-11-08 DIAGNOSIS — U071 COVID-19: Secondary | ICD-10-CM | POA: Diagnosis not present

## 2020-11-08 LAB — BASIC METABOLIC PANEL
Anion gap: 14 (ref 5–15)
BUN: 56 mg/dL — ABNORMAL HIGH (ref 6–20)
CO2: 37 mmol/L — ABNORMAL HIGH (ref 22–32)
Calcium: 10.1 mg/dL (ref 8.9–10.3)
Chloride: 88 mmol/L — ABNORMAL LOW (ref 98–111)
Creatinine, Ser: 0.61 mg/dL (ref 0.61–1.24)
GFR, Estimated: >60 mL/min (ref >60)
Glucose, Bld: 236 mg/dL — ABNORMAL HIGH (ref 70–99)
Potassium: 5 mmol/L (ref 3.5–5.1)
Sodium: 139 mmol/L (ref 135–145)

## 2020-11-08 LAB — CBC
HCT: 26.9 % — ABNORMAL LOW (ref 39.0–52.0)
Hemoglobin: 8.8 g/dL — ABNORMAL LOW (ref 13.0–17.0)
MCH: 31.8 pg (ref 26.0–34.0)
MCHC: 32.7 g/dL (ref 30.0–36.0)
MCV: 97.1 fL (ref 80.0–100.0)
Platelets: 232 10*3/uL (ref 150–400)
RBC: 2.77 MIL/uL — ABNORMAL LOW (ref 4.22–5.81)
RDW: 18.6 % — ABNORMAL HIGH (ref 11.5–15.5)
WBC: 11 10*3/uL — ABNORMAL HIGH (ref 4.0–10.5)
nRBC: 0 % (ref 0.0–0.2)

## 2020-11-08 LAB — BLOOD GAS, ARTERIAL
Acid-Base Excess: 17.4 mmol/L — ABNORMAL HIGH (ref 0.0–2.0)
Bicarbonate: 43.5 mmol/L — ABNORMAL HIGH (ref 20.0–28.0)
FIO2: 45
O2 Saturation: 98.9 %
Patient temperature: 36.5
pCO2 arterial: 74.1 mmHg (ref 32.0–48.0)
pH, Arterial: 7.384 (ref 7.350–7.450)
pO2, Arterial: 109 mmHg — ABNORMAL HIGH (ref 83.0–108.0)

## 2020-11-08 LAB — MAGNESIUM: Magnesium: 1.8 mg/dL (ref 1.7–2.4)

## 2020-11-08 NOTE — Progress Notes (Signed)
Pulmonary Critical Care Medicine [facility] [place]   PULMONARY CRITICAL CARE SERVICE  PROGRESS NOTE  Date of Service: 11/08/2020  Lawrence Freeman  [id number]:[id number]  DOB: 10/26/1971   DOA: 11/01/2020  Referring Physician: Carron Curie, MD  HPI: Lawrence Freeman is a 49 y.o. male seen for follow up of Acute on Chronic Respiratory Failure.  Patient currently is on pressure assist control mode to a 45% his tracheostomy appears to be quite positional and we were able to bring his FiO2 back down his saturations are running 100% right now  Medications: Reviewed on Rounds  Physical Exam:  Vitals: Temperature 97.7 pulse 110 respiratory rate 30 blood pressure is 112/63 saturations 100%  Ventilator Settings on pressure assist control FiO2 is 45% tidal volume 481 IP 28 PEEP 5  . General: Comfortable at this time . Eyes: Grossly normal lids, irises & conjunctiva . ENT: grossly tongue is normal . Neck: no obvious mass . Cardiovascular: S1 S2 normal no gallop . Respiratory: No rhonchi very coarse breath sounds . Abdomen: soft . Skin: no rash seen on limited exam . Musculoskeletal: not rigid . Psychiatric:unable to assess . Neurologic: no seizure no involuntary movements         Lab Data:   Basic Metabolic Panel: Recent Labs  Lab 11/02/20 0428 11/03/20 0420 11/06/20 0503 11/07/20 0702 11/08/20 0557  NA 139 138 133* 136 139  K 5.1 4.5 5.0 4.7 5.0  CL 93* 93* 88* 89* 88*  CO2 34* 31 36* 39* 37*  GLUCOSE 179* 102* 241* 266* 236*  BUN 49* 46* 50* 46* 56*  CREATININE 0.67 0.49* 0.64 0.61 0.61  CALCIUM 9.3 9.5 9.1 9.4 10.1  MG  --   --   --  2.0 1.8    ABG: Recent Labs  Lab 11/01/20 2130 11/02/20 0230 11/07/20 0825 11/07/20 1150 11/08/20 0838  PHART 7.354 7.372 7.146* 7.201* 7.384  PCO2ART 69.9* 66.0* >120* 99.5* 74.1*  PO2ART 91.2 105 101 89.0 109*  HCO3 38.2* 37.6* 40.5* 38.5* 43.5*  O2SAT 97.5 98.3 97.1 97.4 98.9    Liver Function  Tests: Recent Labs  Lab 11/06/20 0503  AST 21  ALT 42  ALKPHOS 119  BILITOT 0.5  PROT 5.8*  ALBUMIN 3.1*   No results for input(s): LIPASE, AMYLASE in the last 168 hours. No results for input(s): AMMONIA in the last 168 hours.  CBC: Recent Labs  Lab 11/03/20 0420 11/06/20 0503 11/07/20 0702 11/08/20 0557  WBC 14.9* 15.2* 23.8* 11.0*  HGB 9.8* 9.0* 9.5* 8.8*  HCT 29.7* 27.9* 29.5* 26.9*  MCV 96.1 96.9 100.0 97.1  PLT 186 181 283 232    Cardiac Enzymes: No results for input(s): CKTOTAL, CKMB, CKMBINDEX, TROPONINI in the last 168 hours.  BNP (last 3 results) No results for input(s): BNP in the last 8760 hours.  ProBNP (last 3 results) No results for input(s): PROBNP in the last 8760 hours.  Radiological Exams: DG CHEST PORT 1 VIEW  Result Date: 11/07/2020 CLINICAL DATA:  Respiratory failure. EXAM: PORTABLE CHEST 1 VIEW COMPARISON:  11/06/2020 FINDINGS: Tracheostomy remains in place. Widespread hazy and patchy pulmonary infiltrates persist. No worsening. No dense consolidation, collapse or effusion. IMPRESSION: No change. Widespread hazy and patchy pulmonary infiltrates. Electronically Signed   By: Paulina Fusi M.D.   On: 11/07/2020 08:45    Assessment/Plan Active Problems:   Acute on chronic respiratory failure with hypoxia (HCC)   COVID-19 virus infection   Pneumonia due to COVID-19 virus   Tracheostomy  complication (HCC)   1. Acute on chronic respiratory failure with hypoxia we will continue with full support on the ventilator.  Spoke with respiratory therapy regarding the patient's tracheostomy it is extremely positional.  Also have asked for ENT consultation to reassess. 2. COVID-19 virus infection in recovery phase we will continue with supportive care. 3. Pneumonia due to COVID-19 treated improving 4. Tracheostomy as above and also patient is at risk for bleeding   I have personally seen and evaluated the patient, evaluated laboratory and imaging results,  formulated the assessment and plan and placed orders. The Patient requires high complexity decision making with multiple systems involvement.  Rounds were done with the Respiratory Therapy Director and Staff therapists and discussed with nursing staff also.  Yevonne Pax, MD [facility] Pulmonary Critical Care Medicine Sleep Medicine

## 2020-11-09 DIAGNOSIS — J9500 Unspecified tracheostomy complication: Secondary | ICD-10-CM | POA: Diagnosis not present

## 2020-11-09 DIAGNOSIS — J1282 Pneumonia due to coronavirus disease 2019: Secondary | ICD-10-CM | POA: Diagnosis not present

## 2020-11-09 DIAGNOSIS — U071 COVID-19: Secondary | ICD-10-CM | POA: Diagnosis not present

## 2020-11-09 DIAGNOSIS — J9621 Acute and chronic respiratory failure with hypoxia: Secondary | ICD-10-CM | POA: Diagnosis not present

## 2020-11-09 NOTE — Progress Notes (Signed)
Pulmonary Critical Care Medicine [facility] [place]   PULMONARY CRITICAL CARE SERVICE  PROGRESS NOTE  Date of Service: 11/09/2020  Lawrence Freeman  [id number]  DOB: 02/17/1971   DOA: 11/01/2020  Referring Physician: Carron Curie, MD  HPI: Lawrence Freeman is a 49 y.o. male seen for follow up of Acute on Chronic Respiratory Failure.  Patient is full support on pressure assist control  Medications: Reviewed on Rounds  Physical Exam:  Vitals: Temperature is 96.0 pulse 90 respiratory rate 30 blood pressure is 132/84 saturations 100%  Ventilator Settings on pressure assist control FiO2 is 45% tidal volume 423 IP 28 PEEP 5  . General: Comfortable at this time . Eyes: Grossly normal lids, irises & conjunctiva . ENT: grossly tongue is normal . Neck: no obvious mass . Cardiovascular: S1 S2 normal no gallop . Respiratory: No rhonchi very coarse breath sounds . Abdomen: soft . Skin: no rash seen on limited exam . Musculoskeletal: not rigid . Psychiatric:unable to assess . Neurologic: no seizure no involuntary movements         Lab Data:   Basic Metabolic Panel: Recent Labs  Lab 11/03/20 0420 11/06/20 0503 11/07/20 0702 11/08/20 0557  NA 138 133* 136 139  K 4.5 5.0 4.7 5.0  CL 93* 88* 89* 88*  CO2 31 36* 39* 37*  GLUCOSE 102* 241* 266* 236*  BUN 46* 50* 46* 56*  CREATININE 0.49* 0.64 0.61 0.61  CALCIUM 9.5 9.1 9.4 10.1  MG  --   --  2.0 1.8    ABG: Recent Labs  Lab 11/07/20 0825 11/07/20 1150 11/08/20 0838  PHART 7.146* 7.201* 7.384  PCO2ART >120* 99.5* 74.1*  PO2ART 101 89.0 109*  HCO3 40.5* 38.5* 43.5*  O2SAT 97.1 97.4 98.9    Liver Function Tests: Recent Labs  Lab 11/06/20 0503  AST 21  ALT 42  ALKPHOS 119  BILITOT 0.5  PROT 5.8*  ALBUMIN 3.1*   No results for input(s): LIPASE, AMYLASE in the last 168 hours. No results for input(s): AMMONIA in the last 168 hours.  CBC: Recent Labs  Lab 11/03/20 0420 11/06/20 0503  11/07/20 0702 11/08/20 0557  WBC 14.9* 15.2* 23.8* 11.0*  HGB 9.8* 9.0* 9.5* 8.8*  HCT 29.7* 27.9* 29.5* 26.9*  MCV 96.1 96.9 100.0 97.1  PLT 186 181 283 232    Cardiac Enzymes: No results for input(s): CKTOTAL, CKMB, CKMBINDEX, TROPONINI in the last 168 hours.  BNP (last 3 results) No results for input(s): BNP in the last 8760 hours.  ProBNP (last 3 results) No results for input(s): PROBNP in the last 8760 hours.  Radiological Exams: DG CHEST PORT 1 VIEW  Result Date: 11/07/2020 CLINICAL DATA:  Respiratory failure. EXAM: PORTABLE CHEST 1 VIEW COMPARISON:  11/06/2020 FINDINGS: Tracheostomy remains in place. Widespread hazy and patchy pulmonary infiltrates persist. No worsening. No dense consolidation, collapse or effusion. IMPRESSION: No change. Widespread hazy and patchy pulmonary infiltrates. Electronically Signed   By: Paulina Fusi M.D.   On: 11/07/2020 08:45    Assessment/Plan Active Problems:   Acute on chronic respiratory failure with hypoxia (HCC)   COVID-19 virus infection   Pneumonia due to COVID-19 virus   Tracheostomy complication (HCC)   1. Acute on chronic respiratory failure with hypoxia we'll continue with full support on the ventilator patient is not stable for weaning 2. COVID-19 virus infection in recovery we'll continue to follow 3. Pneumonia due to COVID-19 treated 4. Tracheostomy remains in place need to monitor position very closely  I have personally seen and evaluated the patient, evaluated laboratory and imaging results, formulated the assessment and plan and placed orders. The Patient requires high complexity decision making with multiple systems involvement.  Rounds were done with the Respiratory Therapy Director and Staff therapists and discussed with nursing staff also.  Allyne Gee, MD [facility] Pulmonary Critical Care Medicine Sleep Medicine

## 2020-11-10 DIAGNOSIS — U071 COVID-19: Secondary | ICD-10-CM | POA: Diagnosis not present

## 2020-11-10 DIAGNOSIS — J9621 Acute and chronic respiratory failure with hypoxia: Secondary | ICD-10-CM | POA: Diagnosis not present

## 2020-11-10 DIAGNOSIS — J9500 Unspecified tracheostomy complication: Secondary | ICD-10-CM | POA: Diagnosis not present

## 2020-11-10 DIAGNOSIS — J1282 Pneumonia due to coronavirus disease 2019: Secondary | ICD-10-CM | POA: Diagnosis not present

## 2020-11-10 LAB — BLOOD GAS, ARTERIAL
Acid-Base Excess: 14 mmol/L — ABNORMAL HIGH (ref 0.0–2.0)
Bicarbonate: 40.1 mmol/L — ABNORMAL HIGH (ref 20.0–28.0)
FIO2: 40
O2 Saturation: 98.9 %
Patient temperature: 36.5
pCO2 arterial: 71.8 mmHg (ref 32.0–48.0)
pH, Arterial: 7.363 (ref 7.350–7.450)
pO2, Arterial: 114 mmHg — ABNORMAL HIGH (ref 83.0–108.0)

## 2020-11-10 LAB — VANCOMYCIN, TROUGH: Vancomycin Tr: 27 ug/mL (ref 15–20)

## 2020-11-10 NOTE — Progress Notes (Signed)
Pulmonary Critical Care Medicine [facility] [place]   PULMONARY CRITICAL CARE SERVICE  PROGRESS NOTE  Date of Service: 11/10/2020  Lawrence Freeman  QQP:[id number]  DOB: 02-Aug-1971   DOA: 11/01/2020  Referring Physician: Carron Curie, MD  HPI: Lawrence Freeman is a 49 y.o. male seen for follow up of Acute on Chronic Respiratory Failure.  Patient right now is on full support on pressure control mode has been on 40% FiO2  Medications: Reviewed on Rounds  Physical Exam:  Vitals: Temperature is 97.1 pulse 104 respiratory rate 30 blood pressure is 162/97 saturations 96%  Ventilator Settings on pressure assist control FiO2 is 40% tidal volume 443 PEEP 5.0 IP 28  . General: Comfortable at this time . Eyes: Grossly normal lids, irises & conjunctiva . ENT: grossly tongue is normal . Neck: no obvious mass . Cardiovascular: S1 S2 normal no gallop . Respiratory: No rhonchi no rales noted at this time . Abdomen: soft . Skin: no rash seen on limited exam . Musculoskeletal: not rigid . Psychiatric:unable to assess . Neurologic: no seizure no involuntary movements         Lab Data:   Basic Metabolic Panel: Recent Labs  Lab 11/06/20 0503 11/07/20 0702 11/08/20 0557  NA 133* 136 139  K 5.0 4.7 5.0  CL 88* 89* 88*  CO2 36* 39* 37*  GLUCOSE 241* 266* 236*  BUN 50* 46* 56*  CREATININE 0.64 0.61 0.61  CALCIUM 9.1 9.4 10.1  MG  --  2.0 1.8    ABG: Recent Labs  Lab 11/07/20 0825 11/07/20 1150 11/08/20 0838 11/10/20 0753  PHART 7.146* 7.201* 7.384 7.363  PCO2ART >120* 99.5* 74.1* 71.8*  PO2ART 101 89.0 109* 114*  HCO3 40.5* 38.5* 43.5* 40.1*  O2SAT 97.1 97.4 98.9 98.9    Liver Function Tests: Recent Labs  Lab 11/06/20 0503  AST 21  ALT 42  ALKPHOS 119  BILITOT 0.5  PROT 5.8*  ALBUMIN 3.1*   No results for input(s): LIPASE, AMYLASE in the last 168 hours. No results for input(s): AMMONIA in the last 168 hours.  CBC: Recent Labs  Lab  11/06/20 0503 11/07/20 0702 11/08/20 0557  WBC 15.2* 23.8* 11.0*  HGB 9.0* 9.5* 8.8*  HCT 27.9* 29.5* 26.9*  MCV 96.9 100.0 97.1  PLT 181 283 232    Cardiac Enzymes: No results for input(s): CKTOTAL, CKMB, CKMBINDEX, TROPONINI in the last 168 hours.  BNP (last 3 results) No results for input(s): BNP in the last 8760 hours.  ProBNP (last 3 results) No results for input(s): PROBNP in the last 8760 hours.  Radiological Exams: No results found.  Assessment/Plan Active Problems:   Acute on chronic respiratory failure with hypoxia (HCC)   COVID-19 virus infection   Pneumonia due to COVID-19 virus   Tracheostomy complication (HCC)   1. Acute on chronic respiratory failure hypoxia we will continue with pressure control mode currently on 40% FiO2 saturations are doing better continue to follow along titrate oxygen down as tolerated.  ABG ordered 2. Tracheostomy positional appreciate ENT input 3. COVID-19 virus infection in recovery 4. Pneumonia due to COVID-19 follow up x-ray   I have personally seen and evaluated the patient, evaluated laboratory and imaging results, formulated the assessment and plan and placed orders. The Patient requires high complexity decision making with multiple systems involvement.  Rounds were done with the Respiratory Therapy Director and Staff therapists and discussed with nursing staff also.  Yevonne Pax, MD [facility] Pulmonary Critical Care Medicine  Sleep Medicine

## 2020-11-11 DIAGNOSIS — U071 COVID-19: Secondary | ICD-10-CM | POA: Diagnosis not present

## 2020-11-11 DIAGNOSIS — J9621 Acute and chronic respiratory failure with hypoxia: Secondary | ICD-10-CM | POA: Diagnosis not present

## 2020-11-11 DIAGNOSIS — J1282 Pneumonia due to coronavirus disease 2019: Secondary | ICD-10-CM | POA: Diagnosis not present

## 2020-11-11 DIAGNOSIS — J9500 Unspecified tracheostomy complication: Secondary | ICD-10-CM | POA: Diagnosis not present

## 2020-11-11 LAB — CULTURE, RESPIRATORY W GRAM STAIN

## 2020-11-11 LAB — VANCOMYCIN, TROUGH: Vancomycin Tr: 8 ug/mL — ABNORMAL LOW (ref 15–20)

## 2020-11-11 NOTE — Progress Notes (Signed)
Pulmonary Critical Care Medicine [facility] [place]   PULMONARY CRITICAL CARE SERVICE  PROGRESS NOTE  Date of Service: 11/11/2020  Lawrence Freeman  [id number]  DOB: 1971-12-01   DOA: 11/01/2020  Referring Physician: Carron Curie, MD  HPI: Lawrence Freeman is a 49 y.o. male seen for follow up of Acute on Chronic Respiratory Failure.  Patient currently is on full support on the ventilator and pressure control mode ABG was done pH is compensated PCO2 still in the 70s  Medications: Reviewed on Rounds  Physical Exam:  Vitals: Temperature 97.0 pulse 93 respiratory rate 30 blood pressures 147/93 saturations 100%  Ventilator Settings on pressure assist control FiO2 40% IP 28 PEEP 5  . General: Comfortable at this time . Eyes: Grossly normal lids, irises & conjunctiva . ENT: grossly tongue is normal . Neck: no obvious mass . Cardiovascular: S1 S2 normal no gallop . Respiratory: No rhonchi no rales are noted at this time . Abdomen: soft . Skin: no rash seen on limited exam . Musculoskeletal: not rigid . Psychiatric:unable to assess . Neurologic: no seizure no involuntary movements         Lab Data:   Basic Metabolic Panel: Recent Labs  Lab 11/06/20 0503 11/07/20 0702 11/08/20 0557  NA 133* 136 139  K 5.0 4.7 5.0  CL 88* 89* 88*  CO2 36* 39* 37*  GLUCOSE 241* 266* 236*  BUN 50* 46* 56*  CREATININE 0.64 0.61 0.61  CALCIUM 9.1 9.4 10.1  MG  --  2.0 1.8    ABG: Recent Labs  Lab 11/07/20 0825 11/07/20 1150 11/08/20 0838 11/10/20 0753  PHART 7.146* 7.201* 7.384 7.363  PCO2ART >120* 99.5* 74.1* 71.8*  PO2ART 101 89.0 109* 114*  HCO3 40.5* 38.5* 43.5* 40.1*  O2SAT 97.1 97.4 98.9 98.9    Liver Function Tests: Recent Labs  Lab 11/06/20 0503  AST 21  ALT 42  ALKPHOS 119  BILITOT 0.5  PROT 5.8*  ALBUMIN 3.1*   No results for input(s): LIPASE, AMYLASE in the last 168 hours. No results for input(s): AMMONIA in the last 168  hours.  CBC: Recent Labs  Lab 11/06/20 0503 11/07/20 0702 11/08/20 0557  WBC 15.2* 23.8* 11.0*  HGB 9.0* 9.5* 8.8*  HCT 27.9* 29.5* 26.9*  MCV 96.9 100.0 97.1  PLT 181 283 232    Cardiac Enzymes: No results for input(s): CKTOTAL, CKMB, CKMBINDEX, TROPONINI in the last 168 hours.  BNP (last 3 results) No results for input(s): BNP in the last 8760 hours.  ProBNP (last 3 results) No results for input(s): PROBNP in the last 8760 hours.  Radiological Exams: No results found.  Assessment/Plan Active Problems:   Acute on chronic respiratory failure with hypoxia (HCC)   COVID-19 virus infection   Pneumonia due to COVID-19 virus   Tracheostomy complication (HCC)   1. Acute on chronic respiratory failure hypoxia we will continue with pressure control respiratory therapy is holding off on weaning because of the ABG status 2. COVID-19 virus infection in recovery 3. Pneumonia due to COVID-19 has been treated we will monitor 4. Tracheostomy will continue supportive care   I have personally seen and evaluated the patient, evaluated laboratory and imaging results, formulated the assessment and plan and placed orders. The Patient requires high complexity decision making with multiple systems involvement.  Rounds were done with the Respiratory Therapy Director and Staff therapists and discussed with nursing staff also.  Yevonne Pax, MD [facility] Pulmonary Critical Care Medicine Sleep Medicine

## 2020-11-12 DIAGNOSIS — J1282 Pneumonia due to coronavirus disease 2019: Secondary | ICD-10-CM | POA: Diagnosis not present

## 2020-11-12 DIAGNOSIS — U071 COVID-19: Secondary | ICD-10-CM | POA: Diagnosis not present

## 2020-11-12 DIAGNOSIS — J9621 Acute and chronic respiratory failure with hypoxia: Secondary | ICD-10-CM | POA: Diagnosis not present

## 2020-11-12 DIAGNOSIS — J9500 Unspecified tracheostomy complication: Secondary | ICD-10-CM | POA: Diagnosis not present

## 2020-11-12 NOTE — Progress Notes (Signed)
Pulmonary Critical Care Medicine [facility] [place]   PULMONARY CRITICAL CARE SERVICE  PROGRESS NOTE  Date of Service: 11/12/2020  Lawrence Freeman  [id number]  DOB: 08/19/1971   DOA: 11/01/2020  Referring Physician: Carron Curie, MD  HPI: Lawrence Freeman is a 49 y.o. male seen for follow up of Acute on Chronic Respiratory Failure.  Remains on the ventilator and full support.  Not tolerating weaning  Medications: Reviewed on Rounds  Physical Exam:  Vitals: Temperature is 97.7 pulse 97 respiratory rate 30 blood pressure is 128/85 saturations 99%  Ventilator Settings on pressure assist control FiO2 45% IP 28 PEEP 5  . General: Comfortable at this time . Eyes: Grossly normal lids, irises & conjunctiva . ENT: grossly tongue is normal . Neck: no obvious mass . Cardiovascular: S1 S2 normal no gallop . Respiratory: No rhonchi rales noted . Abdomen: soft . Skin: no rash seen on limited exam . Musculoskeletal: not rigid . Psychiatric:unable to assess . Neurologic: no seizure no involuntary movements         Lab Data:   Basic Metabolic Panel: Recent Labs  Lab 11/06/20 0503 11/07/20 0702 11/08/20 0557  NA 133* 136 139  K 5.0 4.7 5.0  CL 88* 89* 88*  CO2 36* 39* 37*  GLUCOSE 241* 266* 236*  BUN 50* 46* 56*  CREATININE 0.64 0.61 0.61  CALCIUM 9.1 9.4 10.1  MG  --  2.0 1.8    ABG: Recent Labs  Lab 11/07/20 0825 11/07/20 1150 11/08/20 0838 11/10/20 0753  PHART 7.146* 7.201* 7.384 7.363  PCO2ART >120* 99.5* 74.1* 71.8*  PO2ART 101 89.0 109* 114*  HCO3 40.5* 38.5* 43.5* 40.1*  O2SAT 97.1 97.4 98.9 98.9    Liver Function Tests: Recent Labs  Lab 11/06/20 0503  AST 21  ALT 42  ALKPHOS 119  BILITOT 0.5  PROT 5.8*  ALBUMIN 3.1*   No results for input(s): LIPASE, AMYLASE in the last 168 hours. No results for input(s): AMMONIA in the last 168 hours.  CBC: Recent Labs  Lab 11/06/20 0503 11/07/20 0702 11/08/20 0557  WBC 15.2*  23.8* 11.0*  HGB 9.0* 9.5* 8.8*  HCT 27.9* 29.5* 26.9*  MCV 96.9 100.0 97.1  PLT 181 283 232    Cardiac Enzymes: No results for input(s): CKTOTAL, CKMB, CKMBINDEX, TROPONINI in the last 168 hours.  BNP (last 3 results) No results for input(s): BNP in the last 8760 hours.  ProBNP (last 3 results) No results for input(s): PROBNP in the last 8760 hours.  Radiological Exams: No results found.  Assessment/Plan Active Problems:   Acute on chronic respiratory failure with hypoxia (HCC)   COVID-19 virus infection   Pneumonia due to COVID-19 virus   Tracheostomy complication (HCC)   1. Acute on chronic respiratory failure hypoxia we will continue with pressure control titrate oxygen continue pulmonary toilet 2. COVID-19 virus infection in recovery 3. Pneumonia due to COVID-19 treated 4. Tracheostomy remains in place   I have personally seen and evaluated the patient, evaluated laboratory and imaging results, formulated the assessment and plan and placed orders. The Patient requires high complexity decision making with multiple systems involvement.  Rounds were done with the Respiratory Therapy Director and Staff therapists and discussed with nursing staff also.  Yevonne Pax, MD [facility] Pulmonary Critical Care Medicine Sleep Medicine

## 2020-11-13 DIAGNOSIS — J9621 Acute and chronic respiratory failure with hypoxia: Secondary | ICD-10-CM | POA: Diagnosis not present

## 2020-11-13 DIAGNOSIS — J9500 Unspecified tracheostomy complication: Secondary | ICD-10-CM | POA: Diagnosis not present

## 2020-11-13 DIAGNOSIS — U071 COVID-19: Secondary | ICD-10-CM | POA: Diagnosis not present

## 2020-11-13 DIAGNOSIS — J1282 Pneumonia due to coronavirus disease 2019: Secondary | ICD-10-CM | POA: Diagnosis not present

## 2020-11-13 LAB — BLOOD GAS, ARTERIAL
Acid-Base Excess: 9.7 mmol/L — ABNORMAL HIGH (ref 0.0–2.0)
Bicarbonate: 35.8 mmol/L — ABNORMAL HIGH (ref 20.0–28.0)
FIO2: 40
O2 Saturation: 95.4 %
Patient temperature: 37
pCO2 arterial: 70.7 mmHg (ref 32.0–48.0)
pH, Arterial: 7.325 — ABNORMAL LOW (ref 7.350–7.450)
pO2, Arterial: 77.4 mmHg — ABNORMAL LOW (ref 83.0–108.0)

## 2020-11-13 LAB — VANCOMYCIN, TROUGH: Vancomycin Tr: 12 ug/mL — ABNORMAL LOW (ref 15–20)

## 2020-11-13 NOTE — Progress Notes (Signed)
Pulmonary Critical Care Medicine [facility] [place]   PULMONARY CRITICAL CARE SERVICE  PROGRESS NOTE  Date of Service: 11/13/2020  Lawrence Freeman  [id number]  DOB: [birth year]   DOA: 11/01/2020  Referring Physician: Carron Curie, MD  HPI: Lawrence Freeman is a 49 y.o. male seen for follow up of Acute on Chronic Respiratory Failure.  Patient currently is on pressure support has been on 45% FiO2 IP 28 with a PEEP of 5  Medications: Reviewed on Rounds  Physical Exam:  Vitals: Temperature is 97.0 pulse 96 respiratory rate 30 blood pressure is 141/80 saturations 98%  Ventilator Settings on pressure controlled ventilation FiO2 is 45% IP 28 PEEP 5  . General: Comfortable at this time . Eyes: Grossly normal lids, irises & conjunctiva . ENT: grossly tongue is normal . Neck: no obvious mass . Cardiovascular: S1 S2 normal no gallop . Respiratory: No rhonchi coarse breath sounds . Abdomen: soft . Skin: no rash seen on limited exam . Musculoskeletal: not rigid . Psychiatric:unable to assess . Neurologic: no seizure no involuntary movements         Lab Data:   Basic Metabolic Panel: Recent Labs  Lab 11/07/20 0702 11/08/20 0557  NA 136 139  K 4.7 5.0  CL 89* 88*  CO2 39* 37*  GLUCOSE 266* 236*  BUN 46* 56*  CREATININE 0.61 0.61  CALCIUM 9.4 10.1  MG 2.0 1.8    ABG: Recent Labs  Lab 11/07/20 0825 11/07/20 1150 11/08/20 0838 11/10/20 0753 11/13/20 0600  PHART 7.146* 7.201* 7.384 7.363 7.325*  PCO2ART >120* 99.5* 74.1* 71.8* 70.7*  PO2ART 101 89.0 109* 114* 77.4*  HCO3 40.5* 38.5* 43.5* 40.1* 35.8*  O2SAT 97.1 97.4 98.9 98.9 95.4    Liver Function Tests: No results for input(s): AST, ALT, ALKPHOS, BILITOT, PROT, ALBUMIN in the last 168 hours. No results for input(s): LIPASE, AMYLASE in the last 168 hours. No results for input(s): AMMONIA in the last 168 hours.  CBC: Recent Labs  Lab 11/07/20 0702 11/08/20 0557  WBC 23.8* 11.0*   HGB 9.5* 8.8*  HCT 29.5* 26.9*  MCV 100.0 97.1  PLT 283 232    Cardiac Enzymes: No results for input(s): CKTOTAL, CKMB, CKMBINDEX, TROPONINI in the last 168 hours.  BNP (last 3 results) No results for input(s): BNP in the last 8760 hours.  ProBNP (last 3 results) No results for input(s): PROBNP in the last 8760 hours.  Radiological Exams: No results found.  Assessment/Plan Active Problems:   Acute on chronic respiratory failure with hypoxia (HCC)   COVID-19 virus infection   Pneumonia due to COVID-19 virus   Tracheostomy complication (HCC)   1. Acute on chronic respiratory failure with hypoxia continue with pressure support weaning as tolerated asked for respiratory therapy to try to switch him over he is much more awake and alert 2. COVID-19 virus infection in recovery 3. Pneumonia due to COVID-19 treated 4. Tracheostomy no active bleeding is noted   I have personally seen and evaluated the patient, evaluated laboratory and imaging results, formulated the assessment and plan and placed orders. The Patient requires high complexity decision making with multiple systems involvement.  Rounds were done with the Respiratory Therapy Director and Staff therapists and discussed with nursing staff also.  Yevonne Pax, MD [facility] Pulmonary Critical Care Medicine Sleep Medicine

## 2020-11-14 DIAGNOSIS — U071 COVID-19: Secondary | ICD-10-CM | POA: Diagnosis not present

## 2020-11-14 DIAGNOSIS — J1282 Pneumonia due to coronavirus disease 2019: Secondary | ICD-10-CM | POA: Diagnosis not present

## 2020-11-14 DIAGNOSIS — J9621 Acute and chronic respiratory failure with hypoxia: Secondary | ICD-10-CM | POA: Diagnosis not present

## 2020-11-14 DIAGNOSIS — J9500 Unspecified tracheostomy complication: Secondary | ICD-10-CM | POA: Diagnosis not present

## 2020-11-14 NOTE — Progress Notes (Signed)
Pulmonary Critical Care Medicine [facility] [place]   PULMONARY CRITICAL CARE SERVICE  PROGRESS NOTE  Date of Service: 11/14/2020  Lawrence Freeman  [id number]  DOB: 07-Nov-1971   DOA: 11/01/2020  Referring Physician: Carron Curie, MD  HPI: Lawrence Freeman is a 49 y.o. male seen for follow up of Acute on Chronic Respiratory Failure.  Patient currently is on pressure control mode has been on 40% FiO2 good saturations are noted  Medications: Reviewed on Rounds  Physical Exam:  Vitals: Temperature is 97.6 pulse 103 respiratory rate 30 blood pressure is 146/87 saturations 98%  Ventilator Settings on pressure assist control FiO2 40% IP 28 PEEP 5  . General: Comfortable at this time . Eyes: Grossly normal lids, irises & conjunctiva . ENT: grossly tongue is normal . Neck: no obvious mass . Cardiovascular: S1 S2 normal no gallop . Respiratory: No rhonchi no rales are noted at this time . Abdomen: soft . Skin: no rash seen on limited exam . Musculoskeletal: not rigid . Psychiatric:unable to assess . Neurologic: no seizure no involuntary movements         Lab Data:   Basic Metabolic Panel: Recent Labs  Lab 11/08/20 0557  NA 139  K 5.0  CL 88*  CO2 37*  GLUCOSE 236*  BUN 56*  CREATININE 0.61  CALCIUM 10.1  MG 1.8    ABG: Recent Labs  Lab 11/07/20 1150 11/08/20 0838 11/10/20 0753 11/13/20 0600  PHART 7.201* 7.384 7.363 7.325*  PCO2ART 99.5* 74.1* 71.8* 70.7*  PO2ART 89.0 109* 114* 77.4*  HCO3 38.5* 43.5* 40.1* 35.8*  O2SAT 97.4 98.9 98.9 95.4    Liver Function Tests: No results for input(s): AST, ALT, ALKPHOS, BILITOT, PROT, ALBUMIN in the last 168 hours. No results for input(s): LIPASE, AMYLASE in the last 168 hours. No results for input(s): AMMONIA in the last 168 hours.  CBC: Recent Labs  Lab 11/08/20 0557  WBC 11.0*  HGB 8.8*  HCT 26.9*  MCV 97.1  PLT 232    Cardiac Enzymes: No results for input(s): CKTOTAL, CKMB,  CKMBINDEX, TROPONINI in the last 168 hours.  BNP (last 3 results) No results for input(s): BNP in the last 8760 hours.  ProBNP (last 3 results) No results for input(s): PROBNP in the last 8760 hours.  Radiological Exams: No results found.  Assessment/Plan Active Problems:   Acute on chronic respiratory failure with hypoxia (HCC)   COVID-19 virus infection   Pneumonia due to COVID-19 virus   Tracheostomy complication (HCC)   1. Acute on chronic respiratory failure with hypoxia patient was attempted on pressure support yesterday did not do well back on pressure control respiratory therapy will reassess again 2. COVID-19 virus infection in recovery 3. Pneumonia due to COVID-19 treated improving 4. Tracheostomy reportedly has a leak noted by respiratory therapy because it is a specialty trach B[place] will check placement may need to have ENT reassess   I have personally seen and evaluated the patient, evaluated laboratory and imaging results, formulated the assessment and plan and placed orders. The Patient requires high complexity decision making with multiple systems involvement.  Rounds were done with the Respiratory Therapy Director and Staff therapists and discussed with nursing staff also.  Yevonne Pax, MD [facility] Pulmonary Critical Care Medicine Sleep Medicine

## 2020-11-15 DIAGNOSIS — J9500 Unspecified tracheostomy complication: Secondary | ICD-10-CM | POA: Diagnosis not present

## 2020-11-15 DIAGNOSIS — J1282 Pneumonia due to coronavirus disease 2019: Secondary | ICD-10-CM | POA: Diagnosis not present

## 2020-11-15 DIAGNOSIS — U071 COVID-19: Secondary | ICD-10-CM | POA: Diagnosis not present

## 2020-11-15 DIAGNOSIS — J9621 Acute and chronic respiratory failure with hypoxia: Secondary | ICD-10-CM | POA: Diagnosis not present

## 2020-11-15 NOTE — Progress Notes (Signed)
Pulmonary Critical Care Medicine [facility] [place]   PULMONARY CRITICAL CARE SERVICE  PROGRESS NOTE  Date of Service: 11/15/2020  Lawrence Freeman  [id number]  DOB: 1971/02/13   DOA: 11/01/2020  Referring Physician: Carron Curie, MD  HPI: Lawrence Freeman is a 49 y.o. male seen for follow up of Acute on Chronic Respiratory Failure.  Patient has a new trach placed over the bougie yesterday now is doing little bit better as the pressures have come down  Medications: Reviewed on Rounds  Physical Exam:  Vitals: Temperature is 97.8 pulse 116 respiratory rate is 26 blood pressure 140/90 saturations 96%  Ventilator Settings on full support mode attempt pressure support  . General: Comfortable at this time . Eyes: Grossly normal lids, irises & conjunctiva . ENT: grossly tongue is normal . Neck: no obvious mass . Cardiovascular: S1 S2 normal no gallop . Respiratory: No rhonchi no rales . Abdomen: soft . Skin: no rash seen on limited exam . Musculoskeletal: not rigid . Psychiatric:unable to assess . Neurologic: no seizure no involuntary movements         Lab Data:   Basic Metabolic Panel: No results for input(s): NA, K, CL, CO2, GLUCOSE, BUN, CREATININE, CALCIUM, MG, PHOS in the last 168 hours.  ABG: Recent Labs  Lab 11/10/20 0753 11/13/20 0600  PHART 7.363 7.325*  PCO2ART 71.8* 70.7*  PO2ART 114* 77.4*  HCO3 40.1* 35.8*  O2SAT 98.9 95.4    Liver Function Tests: No results for input(s): AST, ALT, ALKPHOS, BILITOT, PROT, ALBUMIN in the last 168 hours. No results for input(s): LIPASE, AMYLASE in the last 168 hours. No results for input(s): AMMONIA in the last 168 hours.  CBC: No results for input(s): WBC, NEUTROABS, HGB, HCT, MCV, PLT in the last 168 hours.  Cardiac Enzymes: No results for input(s): CKTOTAL, CKMB, CKMBINDEX, TROPONINI in the last 168 hours.  BNP (last 3 results) No results for input(s): BNP in the last 8760  hours.  ProBNP (last 3 results) No results for input(s): PROBNP in the last 8760 hours.  Radiological Exams: No results found.  Assessment/Plan Active Problems:   Acute on chronic respiratory failure with hypoxia (HCC)   COVID-19 virus infection   Pneumonia due to COVID-19 virus   Tracheostomy complication (HCC)   1. Acute on chronic respiratory failure with hypoxia we will make an attempt at pressure support weaning continue with supportive care. 2. COVID-19 virus infection in recovery we will continue to follow. 3. Pneumonia due to COVID-19 slow improvement 4. Tracheostomy has been changed yesterday a little bit better today as far as the airway pressures are concerned we will make tube attempt at weaning   I have personally seen and evaluated the patient, evaluated laboratory and imaging results, formulated the assessment and plan and placed orders. The Patient requires high complexity decision making with multiple systems involvement.  Rounds were done with the Respiratory Therapy Director and Staff therapists and discussed with nursing staff also.  Yevonne Pax, MD [facility] Pulmonary Critical Care Medicine Sleep Medicine

## 2020-11-16 ENCOUNTER — Other Ambulatory Visit (HOSPITAL_COMMUNITY): Payer: Self-pay

## 2020-11-16 DIAGNOSIS — J9621 Acute and chronic respiratory failure with hypoxia: Secondary | ICD-10-CM | POA: Diagnosis not present

## 2020-11-16 DIAGNOSIS — U071 COVID-19: Secondary | ICD-10-CM | POA: Diagnosis not present

## 2020-11-16 DIAGNOSIS — J9500 Unspecified tracheostomy complication: Secondary | ICD-10-CM | POA: Diagnosis not present

## 2020-11-16 DIAGNOSIS — J1282 Pneumonia due to coronavirus disease 2019: Secondary | ICD-10-CM | POA: Diagnosis not present

## 2020-11-16 LAB — CBC
HCT: 30.8 % — ABNORMAL LOW (ref 39.0–52.0)
Hemoglobin: 10.4 g/dL — ABNORMAL LOW (ref 13.0–17.0)
MCH: 32.5 pg (ref 26.0–34.0)
MCHC: 33.8 g/dL (ref 30.0–36.0)
MCV: 96.3 fL (ref 80.0–100.0)
Platelets: 483 10*3/uL — ABNORMAL HIGH (ref 150–400)
RBC: 3.2 MIL/uL — ABNORMAL LOW (ref 4.22–5.81)
RDW: 17.2 % — ABNORMAL HIGH (ref 11.5–15.5)
WBC: 16.3 10*3/uL — ABNORMAL HIGH (ref 4.0–10.5)
nRBC: 0.4 % — ABNORMAL HIGH (ref 0.0–0.2)

## 2020-11-16 LAB — BASIC METABOLIC PANEL
Anion gap: 14 (ref 5–15)
BUN: 47 mg/dL — ABNORMAL HIGH (ref 6–20)
CO2: 31 mmol/L (ref 22–32)
Calcium: 9.2 mg/dL (ref 8.9–10.3)
Chloride: 90 mmol/L — ABNORMAL LOW (ref 98–111)
Creatinine, Ser: 0.67 mg/dL (ref 0.61–1.24)
GFR, Estimated: >60 mL/min (ref >60)
Glucose, Bld: 188 mg/dL — ABNORMAL HIGH (ref 70–99)
Potassium: 4.5 mmol/L (ref 3.5–5.1)
Sodium: 135 mmol/L (ref 135–145)

## 2020-11-16 NOTE — Progress Notes (Signed)
Pulmonary Critical Care Medicine [facility] [place]   PULMONARY CRITICAL CARE SERVICE  PROGRESS NOTE  Date of Service: 11/16/2020  Lawrence Freeman  [id number]  DOB: 1971-09-03   DOA: 11/01/2020  Referring Physician: Carron Curie, MD  HPI: Lawrence Freeman is a 49 y.o. male seen for follow up of Acute on Chronic Respiratory Failure.  Patient is on the ventilator attempted on pressure support did not tolerate  Medications: Reviewed on Rounds  Physical Exam:  Vitals: Temperature is 96.6 pulse 110 respiratory rate 30 blood pressure is 170/70 saturations 99%  Ventilator Settings on pressure assist control FiO2 40% IP 21 tidal volume 450 PEEP 5  . General: Comfortable at this time . Eyes: Grossly normal lids, irises & conjunctiva . ENT: grossly tongue is normal . Neck: no obvious mass . Cardiovascular: S1 S2 normal no gallop . Respiratory: No rhonchi very coarse breath sounds . Abdomen: soft . Skin: no rash seen on limited exam . Musculoskeletal: not rigid . Psychiatric:unable to assess . Neurologic: no seizure no involuntary movements         Lab Data:   Basic Metabolic Panel: Recent Labs  Lab 11/16/20 0427  NA 135  K 4.5  CL 90*  CO2 31  GLUCOSE 188*  BUN 47*  CREATININE 0.67  CALCIUM 9.2    ABG: Recent Labs  Lab 11/10/20 0753 11/13/20 0600  PHART 7.363 7.325*  PCO2ART 71.8* 70.7*  PO2ART 114* 77.4*  HCO3 40.1* 35.8*  O2SAT 98.9 95.4    Liver Function Tests: No results for input(s): AST, ALT, ALKPHOS, BILITOT, PROT, ALBUMIN in the last 168 hours. No results for input(s): LIPASE, AMYLASE in the last 168 hours. No results for input(s): AMMONIA in the last 168 hours.  CBC: Recent Labs  Lab 11/16/20 0427  WBC 16.3*  HGB 10.4*  HCT 30.8*  MCV 96.3  PLT 483*    Cardiac Enzymes: No results for input(s): CKTOTAL, CKMB, CKMBINDEX, TROPONINI in the last 168 hours.  BNP (last 3 results) No results for input(s): BNP in the  last 8760 hours.  ProBNP (last 3 results) No results for input(s): PROBNP in the last 8760 hours.  Radiological Exams: DG Chest Port 1 View  Result Date: 11/16/2020 CLINICAL DATA:  Respiratory failure.  COVID pneumonia. EXAM: PORTABLE CHEST 1 VIEW COMPARISON:  One-view chest x-ray 01/08/2020 FINDINGS: Tracheostomy tube is stable. Heart size is normal. Lung volumes remain low. Patchy airspace opacities are similar to the prior exam. Significant effusions are present. Visualized soft tissues bony thorax are unremarkable. IMPRESSION: 1. Stable patchy airspace disease compatible with COVID pneumonia. 2. Stable tracheostomy tube. Electronically Signed   By: Marin Roberts M.D.   On: 11/16/2020 06:01    Assessment/Plan Active Problems:   Acute on chronic respiratory failure with hypoxia (HCC)   COVID-19 virus infection   Pneumonia due to COVID-19 virus   Tracheostomy complication (HCC)   1. Acute on chronic respiratory failure with hypoxia patient is on full support and pressure control mode currently on 40% FiO2 doing well 2. COVID-19 virus infection in recovery 3. Pneumonia due to COVID-19 treated 4. Tracheostomy no active bleeding is noted   I have personally seen and evaluated the patient, evaluated laboratory and imaging results, formulated the assessment and plan and placed orders. The Patient requires high complexity decision making with multiple systems involvement.  Rounds were done with the Respiratory Therapy Director and Staff therapists and discussed with nursing staff also.  Yevonne Pax, MD [facility] Pulmonary  Critical Care Medicine Sleep Medicine

## 2020-11-17 ENCOUNTER — Other Ambulatory Visit (HOSPITAL_COMMUNITY): Payer: Self-pay

## 2020-11-17 DIAGNOSIS — J9500 Unspecified tracheostomy complication: Secondary | ICD-10-CM | POA: Diagnosis not present

## 2020-11-17 DIAGNOSIS — U071 COVID-19: Secondary | ICD-10-CM | POA: Diagnosis not present

## 2020-11-17 DIAGNOSIS — J9621 Acute and chronic respiratory failure with hypoxia: Secondary | ICD-10-CM | POA: Diagnosis not present

## 2020-11-17 DIAGNOSIS — J1282 Pneumonia due to coronavirus disease 2019: Secondary | ICD-10-CM | POA: Diagnosis not present

## 2020-11-17 LAB — CBC
HCT: 30 % — ABNORMAL LOW (ref 39.0–52.0)
Hemoglobin: 10.1 g/dL — ABNORMAL LOW (ref 13.0–17.0)
MCH: 32.8 pg (ref 26.0–34.0)
MCHC: 33.7 g/dL (ref 30.0–36.0)
MCV: 97.4 fL (ref 80.0–100.0)
Platelets: 427 10*3/uL — ABNORMAL HIGH (ref 150–400)
RBC: 3.08 MIL/uL — ABNORMAL LOW (ref 4.22–5.81)
RDW: 17.6 % — ABNORMAL HIGH (ref 11.5–15.5)
WBC: 16.1 10*3/uL — ABNORMAL HIGH (ref 4.0–10.5)
nRBC: 0.6 % — ABNORMAL HIGH (ref 0.0–0.2)

## 2020-11-17 LAB — BASIC METABOLIC PANEL
Anion gap: 9 (ref 5–15)
BUN: 45 mg/dL — ABNORMAL HIGH (ref 6–20)
CO2: 35 mmol/L — ABNORMAL HIGH (ref 22–32)
Calcium: 9.2 mg/dL (ref 8.9–10.3)
Chloride: 92 mmol/L — ABNORMAL LOW (ref 98–111)
Creatinine, Ser: 0.48 mg/dL — ABNORMAL LOW (ref 0.61–1.24)
GFR, Estimated: >60 mL/min (ref >60)
Glucose, Bld: 176 mg/dL — ABNORMAL HIGH (ref 70–99)
Potassium: 5 mmol/L (ref 3.5–5.1)
Sodium: 136 mmol/L (ref 135–145)

## 2020-11-17 LAB — MAGNESIUM: Magnesium: 2.1 mg/dL (ref 1.7–2.4)

## 2020-11-17 NOTE — Progress Notes (Signed)
Pulmonary Critical Care Medicine [facility] [place]   PULMONARY CRITICAL CARE SERVICE  PROGRESS NOTE  Date of Service: 11/17/2020  Lawrence Freeman  KXF:[id number]  DOB: 1971-05-04   DOA: 11/01/2020  Referring Physician: Carron Curie, MD  HPI: Lawrence Freeman is a 49 y.o. male seen for follow up of Acute on Chronic Respiratory Failure.  Currently on pressure control has been on 40% FiO2 with a PEEP of 5 IP is 21 seems to be tolerating the settings  Medications: Reviewed on Rounds  Physical Exam:  Vitals: Temperature 96.7 pulse 101 respiratory rate is 20 blood pressure is 150/98 saturations 100%  Ventilator Settings on pressure control FiO2 40% PEEP 5 IP 21  . General: Comfortable at this time . Eyes: Grossly normal lids, irises & conjunctiva . ENT: grossly tongue is normal . Neck: no obvious mass . Cardiovascular: S1 S2 normal no gallop . Respiratory: Scattered rhonchi noted . Abdomen: soft . Skin: no rash seen on limited exam . Musculoskeletal: not rigid . Psychiatric:unable to assess . Neurologic: no seizure no involuntary movements         Lab Data:   Basic Metabolic Panel: Recent Labs  Lab 11/16/20 0427 11/17/20 0340  NA 135 136  K 4.5 5.0  CL 90* 92*  CO2 31 35*  GLUCOSE 188* 176*  BUN 47* 45*  CREATININE 0.67 0.48*  CALCIUM 9.2 9.2  MG  --  2.1    ABG: Recent Labs  Lab 11/13/20 0600  PHART 7.325*  PCO2ART 70.7*  PO2ART 77.4*  HCO3 35.8*  O2SAT 95.4    Liver Function Tests: No results for input(s): AST, ALT, ALKPHOS, BILITOT, PROT, ALBUMIN in the last 168 hours. No results for input(s): LIPASE, AMYLASE in the last 168 hours. No results for input(s): AMMONIA in the last 168 hours.  CBC: Recent Labs  Lab 11/16/20 0427 11/17/20 0340  WBC 16.3* 16.1*  HGB 10.4* 10.1*  HCT 30.8* 30.0*  MCV 96.3 97.4  PLT 483* 427*    Cardiac Enzymes: No results for input(s): CKTOTAL, CKMB, CKMBINDEX, TROPONINI in the last 168  hours.  BNP (last 3 results) No results for input(s): BNP in the last 8760 hours.  ProBNP (last 3 results) No results for input(s): PROBNP in the last 8760 hours.  Radiological Exams: DG Chest Port 1 View  Result Date: 11/16/2020 CLINICAL DATA:  Respiratory failure.  COVID pneumonia. EXAM: PORTABLE CHEST 1 VIEW COMPARISON:  One-view chest x-ray 01/08/2020 FINDINGS: Tracheostomy tube is stable. Heart size is normal. Lung volumes remain low. Patchy airspace opacities are similar to the prior exam. Significant effusions are present. Visualized soft tissues bony thorax are unremarkable. IMPRESSION: 1. Stable patchy airspace disease compatible with COVID pneumonia. 2. Stable tracheostomy tube. Electronically Signed   By: Marin Roberts M.D.   On: 11/16/2020 06:01    Assessment/Plan Active Problems:   Acute on chronic respiratory failure with hypoxia (HCC)   COVID-19 virus infection   Pneumonia due to COVID-19 virus   Tracheostomy complication (HCC)   1. Acute on chronic respiratory failure with hypoxia we will plan on trying pressure support weaning today 2. COVID-19 virus infection in recovery 3. Pneumonia due to COVID-19 treated slow improvement 4. Tracheostomy improving we will continue to follow   I have personally seen and evaluated the patient, evaluated laboratory and imaging results, formulated the assessment and plan and placed orders. The Patient requires high complexity decision making with multiple systems involvement.  Rounds were done with the Respiratory Therapy  Librarian, academic therapists and discussed with nursing staff also.  Allyne Gee, MD [facility] Pulmonary Critical Care Medicine Sleep Medicine

## 2020-11-18 DIAGNOSIS — U071 COVID-19: Secondary | ICD-10-CM | POA: Diagnosis not present

## 2020-11-18 DIAGNOSIS — J1282 Pneumonia due to coronavirus disease 2019: Secondary | ICD-10-CM | POA: Diagnosis not present

## 2020-11-18 DIAGNOSIS — J9621 Acute and chronic respiratory failure with hypoxia: Secondary | ICD-10-CM | POA: Diagnosis not present

## 2020-11-18 DIAGNOSIS — J9500 Unspecified tracheostomy complication: Secondary | ICD-10-CM | POA: Diagnosis not present

## 2020-11-18 LAB — POTASSIUM: Potassium: 4.9 mmol/L (ref 3.5–5.1)

## 2020-11-18 NOTE — Progress Notes (Signed)
Pulmonary Critical Care Medicine [facility] [place]   PULMONARY CRITICAL CARE SERVICE  PROGRESS NOTE  Date of Service: 11/18/2020  Lawrence Freeman  [id number]  DOB: 03/30/1971   DOA: 11/01/2020  Referring Physician: Carron Curie, MD  HPI: Lawrence Freeman is a 49 y.o. male seen for follow up of Acute on Chronic Respiratory Failure.  Remains on the ventilator and full support not tolerating weaning attempts  Medications: Reviewed on Rounds  Physical Exam:  Vitals: Patient is on temperature 96.5 pulse 97 respiratory 26 blood pressure is 175/99 saturations 98%  Ventilator Settings on pressure assist control FiO2 40% IP 21 PEEP 5  . General: Comfortable at this time . Eyes: Grossly normal lids, irises & conjunctiva . ENT: grossly tongue is normal . Neck: no obvious mass . Cardiovascular: S1 S2 normal no gallop . Respiratory: No rhonchi very coarse breath sounds . Abdomen: soft . Skin: no rash seen on limited exam . Musculoskeletal: not rigid . Psychiatric:unable to assess . Neurologic: no seizure no involuntary movements         Lab Data:   Basic Metabolic Panel: Recent Labs  Lab 11/16/20 0427 11/17/20 0340 11/18/20 0414  NA 135 136  --   K 4.5 5.0 4.9  CL 90* 92*  --   CO2 31 35*  --   GLUCOSE 188* 176*  --   BUN 47* 45*  --   CREATININE 0.67 0.48*  --   CALCIUM 9.2 9.2  --   MG  --  2.1  --     ABG: Recent Labs  Lab 11/13/20 0600  PHART 7.325*  PCO2ART 70.7*  PO2ART 77.4*  HCO3 35.8*  O2SAT 95.4    Liver Function Tests: No results for input(s): AST, ALT, ALKPHOS, BILITOT, PROT, ALBUMIN in the last 168 hours. No results for input(s): LIPASE, AMYLASE in the last 168 hours. No results for input(s): AMMONIA in the last 168 hours.  CBC: Recent Labs  Lab 11/16/20 0427 11/17/20 0340  WBC 16.3* 16.1*  HGB 10.4* 10.1*  HCT 30.8* 30.0*  MCV 96.3 97.4  PLT 483* 427*    Cardiac Enzymes: No results for input(s): CKTOTAL,  CKMB, CKMBINDEX, TROPONINI in the last 168 hours.  BNP (last 3 results) No results for input(s): BNP in the last 8760 hours.  ProBNP (last 3 results) No results for input(s): PROBNP in the last 8760 hours.  Radiological Exams: DG Abd 1 View  Result Date: 11/17/2020 CLINICAL DATA:  Nausea and vomiting. EXAM: ABDOMEN - 1 VIEW COMPARISON:  11/02/2019 FINDINGS: Gastrostomy tube is identified with distal end projecting over the left upper quadrant of the abdomen in the expected location of the stomach. Diffuse colonic gas is identified. No dilated loops of small or large bowel identified. The visualized osseous structures are unremarkable. IMPRESSION: 1. Diffuse colonic gas. Nonobstructive bowel gas pattern. 2. Gastrostomy tube projects over the left upper quadrant of the abdomen in the expected location of the stomach. Electronically Signed   By: Signa Kell M.D.   On: 11/17/2020 13:05    Assessment/Plan Active Problems:   Acute on chronic respiratory failure with hypoxia (HCC)   COVID-19 virus infection   Pneumonia due to COVID-19 virus   Tracheostomy complication (HCC)   1. Acute on chronic respiratory failure hypoxia we will continue with full support on the ventilator respiratory therapy continues to assess the RSB I mechanics. 2. COVID-19 virus infection in recovery 3. Pneumonia due to COVID-19 treated 4. Tracheostomy not fully stabilized  even though was changed we will continue to monitor.   I have personally seen and evaluated the patient, evaluated laboratory and imaging results, formulated the assessment and plan and placed orders. The Patient requires high complexity decision making with multiple systems involvement.  Rounds were done with the Respiratory Therapy Director and Staff therapists and discussed with nursing staff also.  Yevonne Pax, MD [facility] Pulmonary Critical Care Medicine Sleep Medicine

## 2020-11-19 DIAGNOSIS — J9621 Acute and chronic respiratory failure with hypoxia: Secondary | ICD-10-CM | POA: Diagnosis not present

## 2020-11-19 DIAGNOSIS — J9500 Unspecified tracheostomy complication: Secondary | ICD-10-CM | POA: Diagnosis not present

## 2020-11-19 DIAGNOSIS — J1282 Pneumonia due to coronavirus disease 2019: Secondary | ICD-10-CM | POA: Diagnosis not present

## 2020-11-19 DIAGNOSIS — U071 COVID-19: Secondary | ICD-10-CM | POA: Diagnosis not present

## 2020-11-19 NOTE — Progress Notes (Signed)
Pulmonary Critical Care Medicine [facility] [place]   PULMONARY CRITICAL CARE SERVICE  PROGRESS NOTE  Date of Service: 11/19/2020  Lawrence Freeman  [id number]  DOB: 25-Jan-1971   DOA: 11/01/2020  Referring Physician: Carron Curie, MD  HPI: Lawrence Freeman is a 49 y.o. male seen for follow up of Acute on Chronic Respiratory Failure.  Patient is on pressure assist control currently on 40% FiO2 with a PEEP of 5  Medications: Reviewed on Rounds  Physical Exam:  Vitals: Temperature is 95.8 pulse 103 respiratory 19 blood pressure is 148/99 saturations 99%  Ventilator Settings on pressure assist control FiO2 40% tidal volume 410 PEEP 5 IP 21  . General: Comfortable at this time . Eyes: Grossly normal lids, irises & conjunctiva . ENT: grossly tongue is normal . Neck: no obvious mass . Cardiovascular: S1 S2 normal no gallop . Respiratory: Scattered expansion is equal . Abdomen: soft . Skin: no rash seen on limited exam . Musculoskeletal: not rigid . Psychiatric:unable to assess . Neurologic: no seizure no involuntary movements         Lab Data:   Basic Metabolic Panel: Recent Labs  Lab 11/16/20 0427 11/17/20 0340 11/18/20 0414  NA 135 136  --   K 4.5 5.0 4.9  CL 90* 92*  --   CO2 31 35*  --   GLUCOSE 188* 176*  --   BUN 47* 45*  --   CREATININE 0.67 0.48*  --   CALCIUM 9.2 9.2  --   MG  --  2.1  --     ABG: Recent Labs  Lab 11/13/20 0600  PHART 7.325*  PCO2ART 70.7*  PO2ART 77.4*  HCO3 35.8*  O2SAT 95.4    Liver Function Tests: No results for input(s): AST, ALT, ALKPHOS, BILITOT, PROT, ALBUMIN in the last 168 hours. No results for input(s): LIPASE, AMYLASE in the last 168 hours. No results for input(s): AMMONIA in the last 168 hours.  CBC: Recent Labs  Lab 11/16/20 0427 11/17/20 0340  WBC 16.3* 16.1*  HGB 10.4* 10.1*  HCT 30.8* 30.0*  MCV 96.3 97.4  PLT 483* 427*    Cardiac Enzymes: No results for input(s): CKTOTAL,  CKMB, CKMBINDEX, TROPONINI in the last 168 hours.  BNP (last 3 results) No results for input(s): BNP in the last 8760 hours.  ProBNP (last 3 results) No results for input(s): PROBNP in the last 8760 hours.  Radiological Exams: DG Abd 1 View  Result Date: 11/17/2020 CLINICAL DATA:  Nausea and vomiting. EXAM: ABDOMEN - 1 VIEW COMPARISON:  11/02/2019 FINDINGS: Gastrostomy tube is identified with distal end projecting over the left upper quadrant of the abdomen in the expected location of the stomach. Diffuse colonic gas is identified. No dilated loops of small or large bowel identified. The visualized osseous structures are unremarkable. IMPRESSION: 1. Diffuse colonic gas. Nonobstructive bowel gas pattern. 2. Gastrostomy tube projects over the left upper quadrant of the abdomen in the expected location of the stomach. Electronically Signed   By: Signa Kell M.D.   On: 11/17/2020 13:05    Assessment/Plan Active Problems:   Acute on chronic respiratory failure with hypoxia (HCC)   COVID-19 virus infection   Pneumonia due to COVID-19 virus   Tracheostomy complication (HCC)   1. Acute on chronic respiratory failure with hypoxia we will continue with on full support patient's mechanics have not been feasible for weaning 2. COVID-19 virus infection: We will continue supportive care 3. Pneumonia due to COVID-19 following x-ray  still with residual changes 4. Tracheostomy no active bleeding is noted   I have personally seen and evaluated the patient, evaluated laboratory and imaging results, formulated the assessment and plan and placed orders. The Patient requires high complexity decision making with multiple systems involvement.  Rounds were done with the Respiratory Therapy Director and Staff therapists and discussed with nursing staff also.  Yevonne Pax, MD [facility] Pulmonary Critical Care Medicine Sleep Medicine

## 2020-11-20 LAB — BLOOD GAS, ARTERIAL
Acid-Base Excess: 10.3 mmol/L — ABNORMAL HIGH (ref 0.0–2.0)
Bicarbonate: 38.6 mmol/L — ABNORMAL HIGH (ref 20.0–28.0)
FIO2: 40
O2 Saturation: 97.4 %
Patient temperature: 35
pCO2 arterial: 97.6 mmHg (ref 32.0–48.0)
pH, Arterial: 7.207 — ABNORMAL LOW (ref 7.350–7.450)
pO2, Arterial: 90.3 mmHg (ref 83.0–108.0)

## 2020-11-21 ENCOUNTER — Other Ambulatory Visit (HOSPITAL_COMMUNITY): Payer: Self-pay

## 2020-11-21 ENCOUNTER — Inpatient Hospital Stay (HOSPITAL_COMMUNITY)
Admission: AD | Admit: 2020-11-21 | Discharge: 2020-11-23 | DRG: 871 | Disposition: A | Discharge status: Other Acute Inpatient Hospital | Payer: BC Managed Care – PPO | Source: Other Acute Inpatient Hospital | Attending: Pulmonary Disease | Admitting: Pulmonary Disease

## 2020-11-21 ENCOUNTER — Inpatient Hospital Stay (HOSPITAL_COMMUNITY): Payer: BC Managed Care – PPO

## 2020-11-21 DIAGNOSIS — E43 Unspecified severe protein-calorie malnutrition: Secondary | ICD-10-CM | POA: Insufficient documentation

## 2020-11-21 DIAGNOSIS — L89153 Pressure ulcer of sacral region, stage 3: Secondary | ICD-10-CM | POA: Diagnosis present

## 2020-11-21 DIAGNOSIS — E785 Hyperlipidemia, unspecified: Secondary | ICD-10-CM | POA: Diagnosis present

## 2020-11-21 DIAGNOSIS — J9509 Other tracheostomy complication: Secondary | ICD-10-CM | POA: Diagnosis present

## 2020-11-21 DIAGNOSIS — Z930 Tracheostomy status: Secondary | ICD-10-CM | POA: Diagnosis not present

## 2020-11-21 DIAGNOSIS — X58XXXA Exposure to other specified factors, initial encounter: Secondary | ICD-10-CM | POA: Diagnosis present

## 2020-11-21 DIAGNOSIS — I252 Old myocardial infarction: Secondary | ICD-10-CM | POA: Diagnosis not present

## 2020-11-21 DIAGNOSIS — E1165 Type 2 diabetes mellitus with hyperglycemia: Secondary | ICD-10-CM | POA: Diagnosis present

## 2020-11-21 DIAGNOSIS — J9621 Acute and chronic respiratory failure with hypoxia: Secondary | ICD-10-CM | POA: Diagnosis present

## 2020-11-21 DIAGNOSIS — F419 Anxiety disorder, unspecified: Secondary | ICD-10-CM | POA: Diagnosis present

## 2020-11-21 DIAGNOSIS — Z794 Long term (current) use of insulin: Secondary | ICD-10-CM

## 2020-11-21 DIAGNOSIS — I2510 Atherosclerotic heart disease of native coronary artery without angina pectoris: Secondary | ICD-10-CM | POA: Diagnosis present

## 2020-11-21 DIAGNOSIS — A419 Sepsis, unspecified organism: Secondary | ICD-10-CM | POA: Diagnosis present

## 2020-11-21 DIAGNOSIS — N400 Enlarged prostate without lower urinary tract symptoms: Secondary | ICD-10-CM | POA: Diagnosis present

## 2020-11-21 DIAGNOSIS — Z8616 Personal history of COVID-19: Secondary | ICD-10-CM

## 2020-11-21 DIAGNOSIS — Z79891 Long term (current) use of opiate analgesic: Secondary | ICD-10-CM

## 2020-11-21 DIAGNOSIS — Z9911 Dependence on respirator [ventilator] status: Secondary | ICD-10-CM

## 2020-11-21 DIAGNOSIS — T17990A Other foreign object in respiratory tract, part unspecified in causing asphyxiation, initial encounter: Secondary | ICD-10-CM | POA: Diagnosis present

## 2020-11-21 DIAGNOSIS — J189 Pneumonia, unspecified organism: Secondary | ICD-10-CM | POA: Diagnosis present

## 2020-11-21 DIAGNOSIS — J9690 Respiratory failure, unspecified, unspecified whether with hypoxia or hypercapnia: Secondary | ICD-10-CM

## 2020-11-21 DIAGNOSIS — Z6821 Body mass index (BMI) 21.0-21.9, adult: Secondary | ICD-10-CM | POA: Diagnosis not present

## 2020-11-21 DIAGNOSIS — U071 COVID-19: Secondary | ICD-10-CM | POA: Diagnosis not present

## 2020-11-21 DIAGNOSIS — Z880 Allergy status to penicillin: Secondary | ICD-10-CM | POA: Diagnosis not present

## 2020-11-21 DIAGNOSIS — J209 Acute bronchitis, unspecified: Secondary | ICD-10-CM | POA: Diagnosis not present

## 2020-11-21 DIAGNOSIS — R579 Shock, unspecified: Secondary | ICD-10-CM | POA: Diagnosis present

## 2020-11-21 DIAGNOSIS — I10 Essential (primary) hypertension: Secondary | ICD-10-CM | POA: Diagnosis present

## 2020-11-21 DIAGNOSIS — J9602 Acute respiratory failure with hypercapnia: Secondary | ICD-10-CM | POA: Diagnosis not present

## 2020-11-21 DIAGNOSIS — Z8701 Personal history of pneumonia (recurrent): Secondary | ICD-10-CM

## 2020-11-21 DIAGNOSIS — E875 Hyperkalemia: Secondary | ICD-10-CM | POA: Diagnosis present

## 2020-11-21 DIAGNOSIS — Z888 Allergy status to other drugs, medicaments and biological substances status: Secondary | ICD-10-CM | POA: Diagnosis not present

## 2020-11-21 DIAGNOSIS — M109 Gout, unspecified: Secondary | ICD-10-CM | POA: Diagnosis present

## 2020-11-21 DIAGNOSIS — J9620 Acute and chronic respiratory failure, unspecified whether with hypoxia or hypercapnia: Secondary | ICD-10-CM | POA: Diagnosis present

## 2020-11-21 DIAGNOSIS — Z79890 Hormone replacement therapy (postmenopausal): Secondary | ICD-10-CM

## 2020-11-21 DIAGNOSIS — J9622 Acute and chronic respiratory failure with hypercapnia: Secondary | ICD-10-CM | POA: Diagnosis present

## 2020-11-21 DIAGNOSIS — Z79899 Other long term (current) drug therapy: Secondary | ICD-10-CM

## 2020-11-21 LAB — COMPREHENSIVE METABOLIC PANEL
ALT: 29 U/L (ref 0–44)
AST: 20 U/L (ref 15–41)
Albumin: 2.9 g/dL — ABNORMAL LOW (ref 3.5–5.0)
Alkaline Phosphatase: 73 U/L (ref 38–126)
Anion gap: 11 (ref 5–15)
BUN: 51 mg/dL — ABNORMAL HIGH (ref 6–20)
CO2: 36 mmol/L — ABNORMAL HIGH (ref 22–32)
Calcium: 9.2 mg/dL (ref 8.9–10.3)
Chloride: 93 mmol/L — ABNORMAL LOW (ref 98–111)
Creatinine, Ser: 0.59 mg/dL — ABNORMAL LOW (ref 0.61–1.24)
GFR, Estimated: >60 mL/min (ref >60)
Glucose, Bld: 146 mg/dL — ABNORMAL HIGH (ref 70–99)
Potassium: 3.9 mmol/L (ref 3.5–5.1)
Sodium: 140 mmol/L (ref 135–145)
Total Bilirubin: 0.5 mg/dL (ref 0.3–1.2)
Total Protein: 5.1 g/dL — ABNORMAL LOW (ref 6.5–8.1)

## 2020-11-21 LAB — BASIC METABOLIC PANEL WITH GFR
Anion gap: 13 (ref 5–15)
BUN: 51 mg/dL — ABNORMAL HIGH (ref 6–20)
CO2: 36 mmol/L — ABNORMAL HIGH (ref 22–32)
Calcium: 9.7 mg/dL (ref 8.9–10.3)
Chloride: 88 mmol/L — ABNORMAL LOW (ref 98–111)
Creatinine, Ser: 0.59 mg/dL — ABNORMAL LOW (ref 0.61–1.24)
GFR, Estimated: >60 mL/min
Glucose, Bld: 254 mg/dL — ABNORMAL HIGH (ref 70–99)
Potassium: 5.7 mmol/L — ABNORMAL HIGH (ref 3.5–5.1)
Sodium: 137 mmol/L (ref 135–145)

## 2020-11-21 LAB — BLOOD GAS, ARTERIAL
Acid-Base Excess: 11.8 mmol/L — ABNORMAL HIGH (ref 0.0–2.0)
Acid-Base Excess: 10.5 mmol/L — ABNORMAL HIGH (ref 0.0–2.0)
Bicarbonate: 39.3 mmol/L — ABNORMAL HIGH (ref 20.0–28.0)
Bicarbonate: 40.2 mmol/L — ABNORMAL HIGH (ref 20.0–28.0)
FIO2: 40
FIO2: 55
O2 Saturation: 96.9 %
O2 Saturation: 99.3 %
Patient temperature: 35
Patient temperature: 34.9
pCO2 arterial: 86.8 mmHg (ref 32.0–48.0)
pCO2 arterial: >120 mmHg (ref 32.0–48.0)
pH, Arterial: 7.265 — ABNORMAL LOW (ref 7.350–7.450)
pH, Arterial: 7.126 — CL (ref 7.350–7.450)
pO2, Arterial: 80.7 mmHg — ABNORMAL LOW (ref 83.0–108.0)
pO2, Arterial: 180 mmHg — ABNORMAL HIGH (ref 83.0–108.0)

## 2020-11-21 LAB — CBC
HCT: 31.8 % — ABNORMAL LOW (ref 39.0–52.0)
Hemoglobin: 10.6 g/dL — ABNORMAL LOW (ref 13.0–17.0)
MCH: 31.8 pg (ref 26.0–34.0)
MCHC: 33.3 g/dL (ref 30.0–36.0)
MCV: 95.5 fL (ref 80.0–100.0)
Platelets: 259 K/uL (ref 150–400)
RBC: 3.33 MIL/uL — ABNORMAL LOW (ref 4.22–5.81)
RDW: 16.6 % — ABNORMAL HIGH (ref 11.5–15.5)
WBC: 27 K/uL — ABNORMAL HIGH (ref 4.0–10.5)
nRBC: 0.4 % — ABNORMAL HIGH (ref 0.0–0.2)

## 2020-11-21 LAB — POCT I-STAT 7, (LYTES, BLD GAS, ICA,H+H)
Acid-Base Excess: 16 mmol/L — ABNORMAL HIGH (ref 0.0–2.0)
Bicarbonate: 44.5 mmol/L — ABNORMAL HIGH (ref 20.0–28.0)
Calcium, Ion: 1.27 mmol/L (ref 1.15–1.40)
HCT: 29 % — ABNORMAL LOW (ref 39.0–52.0)
Hemoglobin: 9.9 g/dL — ABNORMAL LOW (ref 13.0–17.0)
O2 Saturation: 100 %
Potassium: 5.3 mmol/L — ABNORMAL HIGH (ref 3.5–5.1)
Sodium: 137 mmol/L (ref 135–145)
TCO2: 47 mmol/L — ABNORMAL HIGH (ref 22–32)
pCO2 arterial: 78.7 mmHg (ref 32.0–48.0)
pH, Arterial: 7.361 (ref 7.350–7.450)
pO2, Arterial: 310 mmHg — ABNORMAL HIGH (ref 83.0–108.0)

## 2020-11-21 LAB — CBC WITH DIFFERENTIAL/PLATELET
Abs Immature Granulocytes: 0.51 10*3/uL — ABNORMAL HIGH (ref 0.00–0.07)
Basophils Absolute: 0.1 10*3/uL (ref 0.0–0.1)
Basophils Relative: 0 %
Eosinophils Absolute: 0 10*3/uL (ref 0.0–0.5)
Eosinophils Relative: 0 %
HCT: 26.6 % — ABNORMAL LOW (ref 39.0–52.0)
Hemoglobin: 8.9 g/dL — ABNORMAL LOW (ref 13.0–17.0)
Immature Granulocytes: 2 %
Lymphocytes Relative: 4 %
Lymphs Abs: 0.9 10*3/uL (ref 0.7–4.0)
MCH: 32 pg (ref 26.0–34.0)
MCHC: 33.5 g/dL (ref 30.0–36.0)
MCV: 95.7 fL (ref 80.0–100.0)
Monocytes Absolute: 1.4 10*3/uL — ABNORMAL HIGH (ref 0.1–1.0)
Monocytes Relative: 7 %
Neutro Abs: 18.3 10*3/uL — ABNORMAL HIGH (ref 1.7–7.7)
Neutrophils Relative %: 87 %
Platelets: 237 10*3/uL (ref 150–400)
RBC: 2.78 MIL/uL — ABNORMAL LOW (ref 4.22–5.81)
RDW: 17 % — ABNORMAL HIGH (ref 11.5–15.5)
WBC: 21.1 10*3/uL — ABNORMAL HIGH (ref 4.0–10.5)
nRBC: 0.6 % — ABNORMAL HIGH (ref 0.0–0.2)

## 2020-11-21 LAB — URINALYSIS, ROUTINE W REFLEX MICROSCOPIC
Bilirubin Urine: NEGATIVE
Glucose, UA: NEGATIVE mg/dL
Hgb urine dipstick: NEGATIVE
Ketones, ur: NEGATIVE mg/dL
Leukocytes,Ua: NEGATIVE
Nitrite: NEGATIVE
Protein, ur: 30 mg/dL — AB
Specific Gravity, Urine: 1.023 (ref 1.005–1.030)
pH: 5 (ref 5.0–8.0)

## 2020-11-21 LAB — MAGNESIUM
Magnesium: 2 mg/dL (ref 1.7–2.4)
Magnesium: 1.8 mg/dL (ref 1.7–2.4)
Magnesium: 1.8 mg/dL (ref 1.7–2.4)

## 2020-11-21 LAB — PHOSPHORUS
Phosphorus: 3.1 mg/dL (ref 2.5–4.6)
Phosphorus: 3.2 mg/dL (ref 2.5–4.6)

## 2020-11-21 LAB — GLUCOSE, CAPILLARY
Glucose-Capillary: 100 mg/dL — ABNORMAL HIGH (ref 70–99)
Glucose-Capillary: 134 mg/dL — ABNORMAL HIGH (ref 70–99)
Glucose-Capillary: 142 mg/dL — ABNORMAL HIGH (ref 70–99)
Glucose-Capillary: 148 mg/dL — ABNORMAL HIGH (ref 70–99)
Glucose-Capillary: 219 mg/dL — ABNORMAL HIGH (ref 70–99)

## 2020-11-21 LAB — TROPONIN I (HIGH SENSITIVITY)
Troponin I (High Sensitivity): 18 ng/L — ABNORMAL HIGH (ref <18)
Troponin I (High Sensitivity): 20 ng/L — ABNORMAL HIGH (ref <18)

## 2020-11-21 LAB — LACTIC ACID, PLASMA: Lactic Acid, Venous: 2.3 mmol/L (ref 0.5–1.9)

## 2020-11-21 LAB — PROCALCITONIN: Procalcitonin: 0.26 ng/mL

## 2020-11-21 LAB — HIV ANTIBODY (ROUTINE TESTING W REFLEX): HIV Screen 4th Generation wRfx: NONREACTIVE

## 2020-11-21 LAB — MRSA PCR SCREENING: MRSA by PCR: NEGATIVE

## 2020-11-21 MED ORDER — SODIUM ZIRCONIUM CYCLOSILICATE 5 G PO PACK
5.0000 g | PACK | Freq: Once | ORAL | Status: AC
  Administered 2020-11-21: 5 g via ORAL
  Filled 2020-11-21: qty 1

## 2020-11-21 MED ORDER — PROSOURCE TF PO LIQD
45.0000 mL | Freq: Three times a day (TID) | ORAL | Status: DC
  Administered 2020-11-21 – 2020-11-23 (×6): 45 mL
  Filled 2020-11-21 (×6): qty 45

## 2020-11-21 MED ORDER — PRASUGREL HCL 10 MG PO TABS: 10.0000 mg | ORAL_TABLET | Freq: Every day | ORAL | Status: DC

## 2020-11-21 MED ORDER — CLONAZEPAM 0.5 MG PO TABS
0.5000 mg | ORAL_TABLET | Freq: Three times a day (TID) | ORAL | Status: DC
  Administered 2020-11-21: 0.5 mg via ORAL
  Filled 2020-11-21: qty 1

## 2020-11-21 MED ORDER — DOCUSATE SODIUM 50 MG/5ML PO LIQD: 100.0000 mg | Freq: Every day | ORAL | Status: DC | PRN

## 2020-11-21 MED ORDER — CHLORHEXIDINE GLUCONATE CLOTH 2 % EX PADS
6.0000 | MEDICATED_PAD | Freq: Every day | CUTANEOUS | Status: DC
  Administered 2020-11-22: 6 via TOPICAL

## 2020-11-21 MED ORDER — INSULIN ASPART 100 UNIT/ML ~~LOC~~ SOLN
0.0000 [IU] | Freq: Three times a day (TID) | SUBCUTANEOUS | Status: DC
  Administered 2020-11-21 (×2): 1 [IU] via SUBCUTANEOUS

## 2020-11-21 MED ORDER — IPRATROPIUM-ALBUTEROL 0.5-2.5 (3) MG/3ML IN SOLN
3.0000 mL | RESPIRATORY_TRACT | Status: DC
  Administered 2020-11-21 – 2020-11-22 (×8): 3 mL via RESPIRATORY_TRACT
  Filled 2020-11-21 (×8): qty 3

## 2020-11-21 MED ORDER — ONDANSETRON HCL 4 MG/2ML IJ SOLN
4.0000 mg | Freq: Four times a day (QID) | INTRAMUSCULAR | Status: DC | PRN
  Administered 2020-11-23: 4 mg via INTRAVENOUS
  Filled 2020-11-21: qty 2

## 2020-11-21 MED ORDER — QUETIAPINE FUMARATE 50 MG PO TABS
50.0000 mg | ORAL_TABLET | Freq: Every day | ORAL | Status: DC
  Administered 2020-11-21 – 2020-11-22 (×2): 50 mg
  Filled 2020-11-21 (×2): qty 1

## 2020-11-21 MED ORDER — GABAPENTIN 250 MG/5ML PO SOLN
250.0000 mg | Freq: Three times a day (TID) | ORAL | Status: DC
  Administered 2020-11-21 – 2020-11-23 (×6): 250 mg
  Filled 2020-11-21 (×11): qty 6

## 2020-11-21 MED ORDER — OSMOLITE 1.5 CAL PO LIQD
1000.0000 mL | ORAL | Status: DC
  Administered 2020-11-21 – 2020-11-23 (×3): 1000 mL
  Filled 2020-11-21 (×5): qty 1000

## 2020-11-21 MED ORDER — POLYETHYLENE GLYCOL 3350 17 G PO PACK: 17.0000 g | PACK | Freq: Every day | ORAL | Status: DC | PRN

## 2020-11-21 MED ORDER — NOREPINEPHRINE 4 MG/250ML-% IV SOLN
0.0000 ug/min | INTRAVENOUS | Status: DC
  Administered 2020-11-21: 5 ug/min via INTRAVENOUS

## 2020-11-21 MED ORDER — PRASUGREL HCL 10 MG PO TABS
10.0000 mg | ORAL_TABLET | Freq: Every day | ORAL | Status: DC
  Administered 2020-11-21 – 2020-11-23 (×3): 10 mg
  Filled 2020-11-21 (×3): qty 1

## 2020-11-21 MED ORDER — FREE WATER
200.0000 mL | Freq: Four times a day (QID) | Status: DC
  Administered 2020-11-21 – 2020-11-23 (×8): 200 mL

## 2020-11-21 MED ORDER — ENOXAPARIN SODIUM 40 MG/0.4ML ~~LOC~~ SOLN
40.0000 mg | SUBCUTANEOUS | Status: DC
  Administered 2020-11-21 – 2020-11-22 (×2): 40 mg via SUBCUTANEOUS
  Filled 2020-11-21 (×2): qty 0.4

## 2020-11-21 MED ORDER — CLONAZEPAM 0.5 MG PO TABS
0.5000 mg | ORAL_TABLET | Freq: Three times a day (TID) | ORAL | Status: DC
  Administered 2020-11-21 – 2020-11-23 (×5): 0.5 mg
  Filled 2020-11-21 (×5): qty 1

## 2020-11-21 MED ORDER — QUETIAPINE FUMARATE 50 MG PO TABS: 50.0000 mg | ORAL_TABLET | Freq: Every day | ORAL | Status: DC

## 2020-11-21 MED ORDER — DOCUSATE SODIUM 100 MG PO CAPS: 100.0000 mg | ORAL_CAPSULE | Freq: Two times a day (BID) | ORAL | Status: DC | PRN

## 2020-11-21 MED ORDER — VANCOMYCIN HCL IN DEXTROSE 1-5 GM/200ML-% IV SOLN
1000.0000 mg | Freq: Three times a day (TID) | INTRAVENOUS | Status: DC
  Administered 2020-11-21 – 2020-11-22 (×3): 1000 mg via INTRAVENOUS
  Filled 2020-11-21 (×3): qty 200

## 2020-11-21 MED ORDER — VANCOMYCIN HCL 1250 MG/250ML IV SOLN
1250.0000 mg | Freq: Once | INTRAVENOUS | Status: AC
  Administered 2020-11-21: 1250 mg via INTRAVENOUS
  Filled 2020-11-21: qty 250

## 2020-11-21 MED ORDER — ALLOPURINOL 100 MG PO TABS
300.0000 mg | ORAL_TABLET | Freq: Every day | ORAL | Status: DC
  Administered 2020-11-21 – 2020-11-23 (×3): 300 mg
  Filled 2020-11-21 (×3): qty 3

## 2020-11-21 MED ORDER — AMANTADINE HCL 50 MG/5ML PO SOLN
50.0000 mg | Freq: Every day | ORAL | Status: DC
  Administered 2020-11-21 – 2020-11-22 (×2): 50 mg
  Filled 2020-11-21 (×3): qty 5

## 2020-11-21 MED ORDER — FAMOTIDINE IN NACL 20-0.9 MG/50ML-% IV SOLN
20.0000 mg | Freq: Two times a day (BID) | INTRAVENOUS | Status: DC
  Administered 2020-11-21 – 2020-11-22 (×3): 20 mg via INTRAVENOUS
  Filled 2020-11-21 (×3): qty 50

## 2020-11-21 MED ORDER — SODIUM CHLORIDE 0.9 % IV SOLN
2.0000 g | Freq: Three times a day (TID) | INTRAVENOUS | Status: DC
  Administered 2020-11-21 – 2020-11-22 (×4): 2 g via INTRAVENOUS
  Filled 2020-11-21 (×5): qty 2

## 2020-11-21 MED ORDER — PROSOURCE TF PO LIQD: 45.0000 mL | Freq: Two times a day (BID) | ORAL | Status: DC

## 2020-11-21 MED ORDER — TESTOSTERONE 50 MG/5GM (1%) TD GEL
5.0000 g | Freq: Every day | TRANSDERMAL | Status: DC
  Administered 2020-11-21 – 2020-11-23 (×3): 5 g via TRANSDERMAL
  Filled 2020-11-21 (×3): qty 5

## 2020-11-21 MED ORDER — ALLOPURINOL 20 MG/ML ORAL SUSPENSION: 300.0000 mg | Freq: Every day | ORAL | Status: DC

## 2020-11-21 NOTE — Procedures (Signed)
Diagnostic Bronchoscopy Procedure Note   Lawrence Freeman  [id number]  1971/11/28  Date:11/21/20  Time:6:08 AM   Provider Performing:Shatora Weatherbee A Rande Lawman  Procedure: Diagnostic Bronchoscopy [phone])  Indication(s)  unable to deliver adequate tidal volumes through tracheostomy tube  Consent  procedure was done emergently without consent due to decompensating patient bradycardia secondary to hypercapnia  No anesthesia use due to urgency and obtundation of the patient   Time Out Verified patient identification, verified procedure, site/side was marked, verified correct patient position, special equipment/implants available, medications/allergies/relevant history reviewed, required imaging and test results available.   Sterile Technique Usual hand hygiene, masks, gowns, and gloves were used   Procedure Description  Bronchoscope was inserted through Bivona trach tube.  There was a large amount of reddish almost granulomatous appearing thick tenacious mucus that had adhered to the inside of the trach tube and cause significant stenosis.  This was broken away mechanically with the bronchoscope.  The distal end of the Brovana trach was abutting the carina.  I believe a combination of both this tenacious mucus of which there was still some adhered to the inside of the trach tube wall and the fact that the distal end of the tube was abutting the carina both resulted in an inability to deliver adequate tidal volumes.  The mucus was removed as best we could and the tracheostomy tube was padded as it leaves the stoma in order to elevate the tube off of the carina.  Note there is a small area of skin breakdown around the 5:00 area of the tracheostomy stoma.   Complications/Tolerance None; patient tolerated the procedure well..  Clinically improved at end of procedure with improvement in tidal volumes improvement in oxygen saturation and improvement in blood  pressure.   EBL None   Specimen(s) None

## 2020-11-21 NOTE — Progress Notes (Signed)
Pt received on LTV.  Transitioned to BMV.  Pt difficult to ventilate.  Bedside bronchoscope performed.  See MD note.  Airway resecured.  Return tidal volumes better relative to high level of pressure control.  Follow up ABG obtained.  PC and FiO2 adjusted.  Volumes went from 520 to 480 after dropping pressure control from 28 to 22.  Will keep patient on documented settings for now.

## 2020-11-21 NOTE — Progress Notes (Signed)
EKG CRITICAL VALUE     12 lead EKG performed.  Critical value noted.  Chauncy Lean, RN notified.   Deitra Mayo, CCT 11/21/2020 8:31 AM

## 2020-11-21 NOTE — Progress Notes (Signed)
Pharmacy Antibiotic Note  Lawrence Freeman is a 49 y.o. male with a recent COVID-19 infection has now been admitted on 11/21/2020 with sepsis that has a possible pneumonia source. WBC 27, Scr 0.59, T 96.9 F, lactic acid 2.3. The patient has been bed bound since their last COVID-19 admission in 08/2020 . Pharmacy has been consulted for cefepime and vancomycin dosing.  The patient has a documented anaphylaxis reaction to penicillins, however, per the limited paper records from [facility], the patient had a possible reaction to cefepime in 10/2020 that was treated with prednisone, but was either continued on cefepime or restarted on cefepime through the middle to end of 10/2020. I discussed the situation with Dr. Francine Graven who would like to continue with cefepime and monitor for an allergic reaction.   Plan: Initiate vancomycin IV 1250 mg x1 dose then 1000 mg q8h thereafter Target goal trough of 15-20 mcg/mL, obtain troughs at steady state Initiate cefepime IV 2 g q8h Monitor renal function, cultures, clinical status and length of therapy Deescalate therapy as clinically indicated Monitor for signs and symptoms of an allergic reaction with cefepime    Temp (24hrs), Avg:94.7 F (34.8 C), Min:92.5 F (33.6 C), Max:96.9 F (36.1 C)  Recent Labs  Lab 11/16/20 0427 11/17/20 0340 11/21/20 0027 11/21/20 0336  WBC 16.3* 16.1* 27.0*  --   CREATININE 0.67 0.48* 0.59*  --   LATICACIDVEN  --   --   --  2.3*    CrCl cannot be calculated (Unknown ideal weight.).    Allergies  Allergen Reactions  . Penicillins Anaphylaxis  . Precedex [Dexmedetomidine Hcl In Nacl] Other (See Comments)    Severe bradycardia    Antimicrobials this admission: 11/29 vancomycin >> 11/29 cefepime >>  Dose adjustments this admission: None  Microbiology results: 11/29 Tracheal Aspirate: sent 11/29 MRSA PCR: sent  Thank you for allowing pharmacy to be a part of this patient's care.  Sanda Klein, PharmD, RPh   PGY-1 Pharmacy Resident 11/21/2020 8:50 AM  Please check [email or web address] for unit-specific pharmacy phone numbers.

## 2020-11-21 NOTE — Progress Notes (Signed)
Pt transported via rapid response from [facility].

## 2020-11-21 NOTE — Progress Notes (Signed)
PT Cancellation Note  Patient Details Name: Daimon Kean MRN: [id number] DOB: 05/22/1971   Cancelled Treatment:    Reason Eval/Treat Not Completed: Patient not medically ready (Pt admitted this date with current HR 129 at rest. will hold)   Ardith Test B Chas[place] Yepiz 11/21/2020, 9:58 AM Merryl Hacker, PT Acute Rehabilitation Services Pager: [phone] Office: [phone]

## 2020-11-21 NOTE — Progress Notes (Signed)
Patient chronic foley remove and new foley inserted, as order by MD. U/A collected and send to the lab.

## 2020-11-21 NOTE — Progress Notes (Signed)
Bare Hugger initiated

## 2020-11-21 NOTE — H&P (Signed)
NAME:  Lawrence Freeman, MRN:  [id number], DOB:  [birth year], LOS: 0 ADMISSION DATE:  11/21/2020, CONSULTATION DATE: 11/21/2020 [facility]ING MD: [facility], CHIEF COMPLAINT: Hypercapnic respiratory failure  Brief History   49 year old recently diagnosed with Covid pneumonia along stay and brought hospital in [facility], I was asked to see due to worsening hypercapnic respiratory failure.  History of present illness   49 year old male treated at [facility]id pneumonia and [facility] about 3 to 4 weeks ago.  He was trached reportedly profoundly weak.  Tracheostomy was done on 09/20/2020 and has since been replaced with a Bivona trach.  His trach does not have an inner cannula for cleaning.  Apparently through the night the patient became progressively hypercapnic with worsening in his mental status and dyssynchronous breathing with the ventilator.  Tidal volumes on a set pressure control of 28 cm of water were about 20 [facility] at about [facility] this morning.  He was transferred to [facility], ICU emergently underwent bronchoscopy.  Bronchoscopy shows thick tenacious reddish mucoid material that is stubbornly adherent to the inner wall of the Bivona trach.  It also appears that the trach had migrated down almost to the level of the carina.  Believe that this material had obstructed or stenosed the inner cannula along with the end of the trach abutting the carina resulted in an inability for the patient to get appropriate tidal volumes.  Once this material was removed to the best of my ability, although there was a significant amount still adherent to the wall of the trach tube, tidal volumes were much more easily delivered.  We also padded the underneath of the trach tube as it left the stoma in order to pull it back from the carina.  These 2 changes resulted in marked  improvement in his tidal volumes from 200 up to almost 700.  With improvement presumably in his pH is blood pressure came up to the 130s over 80 and we will get a start reducing his Levophed.  Patient did not receive sedation for this due to obtundation. Will obtain chest x-ray CMP ABG CBC.  Patient stable at time of this dictation significantly improved post bronchoscopy  Past Medical History  Hypertension Hyperlipidemia Covid positive pneumonia Tracheostomy  Significant Hospital Events   Transferred to [facility], ICU  Consults:  PCCM  Procedures:  Bronchoscopy  Significant Diagnostic Tests:  Chest x-ray performed on the floor reading was consistent with previous Covid pneumonia  Micro Data:  NA  Antimicrobials:  NA  Interim history/subjective:  NA  Objective   There were no vitals taken for this visit.       No intake or output data in the 24 hours ending 11/21/20 0550 There were no vitals filed for this visit.  Examination: General: Thin male in some degree of respiratory distress prior to bronchoscopy HENT: Within normal limits Lungs: Coarse bilaterally Cardiovascular: Regular rate and rhythm Abdomen: Benign Extremities: Thin no edema Neuro: Obtunded GU: N/A  Resolved Hospital Problem list   NA  Assessment & Plan:  1.  Hypercapnic respiratory failure secondary to mucus obstructing the inner wall of his Bivona trach and abutting the carina  2.  Post Covid pneumonia  3.  Respiratory failure ventilator dependent with tracheostomy  Best practice (evaluated daily)   Diet: Will consult nutrition Pain/Anxiety/Delirium protocol (if indicated): N/A VAP protocol (if indicated): Continue  current ventilatory modes discussed with respiratory DVT prophylaxis: Lovenox GI prophylaxis: Pepcid Glucose control: Monitor Mobility: Bedrest Code Status: Full Disposition: Continue ICU care  Labs   CBC: Recent Labs  Lab 11/16/20 0427 11/17/20 0340 11/21/20 0027   WBC 16.3* 16.1* 27.0*  HGB 10.4* 10.1* 10.6*  HCT 30.8* 30.0* 31.8*  MCV 96.3 97.4 95.5  PLT 483* 427* 259    Basic Metabolic Panel: Recent Labs  Lab 11/16/20 0427 11/17/20 0340 11/18/20 0414 11/21/20 0027  NA 135 136  --  137  K 4.5 5.0 4.9 5.7*  CL 90* 92*  --  88*  CO2 31 35*  --  36*  GLUCOSE 188* 176*  --  254*  BUN 47* 45*  --  51*  CREATININE 0.67 0.48*  --  0.59*  CALCIUM 9.2 9.2  --  9.7  MG  --  2.1  --  2.0   GFR: CrCl cannot be calculated (Unknown ideal weight.). Recent Labs  Lab 11/16/20 0427 11/17/20 0340 11/21/20 0027  WBC 16.3* 16.1* 27.0*    Liver Function Tests: No results for input(s): AST, ALT, ALKPHOS, BILITOT, PROT, ALBUMIN in the last 168 hours. No results for input(s): LIPASE, AMYLASE in the last 168 hours. No results for input(s): AMMONIA in the last 168 hours.  ABG    Component Value Date/Time   PHART 7.126 (LL) 11/21/2020 0330   PCO2ART >120.0 (HH) 11/21/2020 0330   PO2ART 180 (H) 11/21/2020 0330   HCO3 40.2 (H) 11/21/2020 0330   TCO2 43 (H) 10/27/2020 0059   O2SAT 99.3 11/21/2020 0330     Coagulation Profile: No results for input(s): INR, PROTIME in the last 168 hours.  Cardiac Enzymes: No results for input(s): CKTOTAL, CKMB, CKMBINDEX, TROPONINI in the last 168 hours.  HbA1C: Hgb A1c MFr Bld  Date/Time Value Ref Range Status  10/12/2020 05:54 AM 6.5 (H) 4.8 - 5.6 % Final    Comment:    (NOTE)         Prediabetes: 5.7 - 6.4         Diabetes: >6.4         Glycemic control for adults with diabetes: <7.0     CBG: No results for input(s): GLUCAP in the last 168 hours.  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   Surgical History    Past Surgical History:  Procedure Laterality Date  . IR GASTROSTOMY TUBE MOD SED  10/19/2020     Social History   reports that he has never smoked. He has never used smokeless tobacco. He reports previous alcohol use. He reports that he does not use drugs.   Family History   His family history is not on file.   Allergies Allergies  Allergen Reactions  . Penicillins Anaphylaxis  . Precedex [Dexmedetomidine Hcl In Nacl] Other (See Comments)    Severe bradycardia     Home Medications  Prior to Admission medications   Medication Sig Start Date End Date Taking? Authorizing Provider  acetaminophen (TYLENOL) 160 MG/5ML solution Place 20.3 mLs (650 mg total) into feeding tube every 6 (six) hours as needed for mild pain. 11/01/20   Desai, Rahul P, PA-C  allopurinol (ZYLOPRIM) 300 MG tablet  Place 1 tablet (300 mg total) into feeding tube daily. 11/02/20   Desai, Rahul P, PA-C  amantadine (SYMMETREL) 50 MG/5ML solution Place 10 mLs (100 mg total) into feeding tube daily. 11/02/20   Desai, Rahul P, PA-C  Chlorhexidine Gluconate Cloth 2 % PADS Apply 6 each topically daily. 11/02/20   Desai, Rahul P, PA-C  chlorhexidine gluconate, MEDLINE KIT, (PERIDEX) 0.12 % solution 15 mLs by Mouth Rinse route 2 (two) times daily. 11/01/20   Desai, Rahul P, PA-C  clonazePAM (KLONOPIN) 0.5 MG disintegrating tablet Place 1 tablet (0.5 mg total) into feeding tube every 6 (six) hours. 11/01/20   Desai, Rahul P, PA-C  docusate (COLACE) 50 MG/5ML liquid Place 10 mLs (100 mg total) into feeding tube 2 (two) times daily as needed for mild constipation. 11/01/20   Desai, Rahul P, PA-C  fentaNYL (SUBLIMAZE) SOLN Inject 50 mcg into the vein every 15 (fifteen) minutes as needed (to maintain RASS & CPOT goal.). 11/01/20   Desai, Rahul P, PA-C  fiber (NUTRISOURCE FIBER) PACK packet Place 1 packet into feeding tube 2 (two) times daily. 11/01/20   Desai, Rahul P,  PA-C  folic acid (FOLVITE) 1 MG tablet Place 1 tablet (1 mg total) into feeding tube daily. 11/02/20   Desai, Rahul P, PA-C  gabapentin (NEURONTIN) 250 MG/5ML solution Place 4 mLs (200 mg total) into feeding tube every 8 (eight) hours. 11/01/20   Desai, Rahul P, PA-C  heparin 5000 UNIT/ML injection Inject 1 mL (5,000 Units total) into the skin every 8 (eight) hours. 11/01/20   Desai, Rahul P, PA-C  hydrALAZINE (APRESOLINE) 20 MG/ML injection Inject 0.5 mLs (10 mg total) into the vein every 4 (four) hours as needed (SBP > 170 or DBP > 100.). 11/01/20   Desai, Rahul P, PA-C  insulin aspart (NOVOLOG) 100 UNIT/ML injection Inject 0-20 Units into the skin every 4 (four) hours. 11/01/20   Desai, Rahul P, PA-C  insulin glargine (LANTUS) 100 UNIT/ML injection Inject 0.1 mLs (10 Units total) into the skin 2 (two) times daily. 11/01/20   Desai, Rahul P, PA-C  ipratropium-albuterol (DUONEB) 0.5-2.5 (3) MG/3ML SOLN Take 3 mLs by nebulization every 4 (four) hours as needed. 11/01/20   Desai, Rahul P, PA-C  lidocaine (LIDODERM) 5 % Place 1 patch onto the skin daily. Remove & Discard patch within 12 hours or as directed by MD 11/02/20   Shearon Stalls, Rahul P, PA-C  lisinopril (ZESTRIL) 5 MG tablet Place 1 tablet (5 mg total) into feeding tube daily. 11/02/20   Desai, Rahul P, PA-C  metoprolol tartrate (LOPRESSOR) 5 MG/5ML SOLN injection Inject 2.5 mLs (2.5 mg total) into the vein every 3 (three) hours as needed (HR > 115.). 11/01/20   Desai, Rahul P, PA-C  nutrition supplement, JUVEN, (JUVEN) PACK Place 1 packet into feeding tube 2 (two) times daily between meals. 11/01/20   Desai, Rahul P, PA-C  Nutritional Supplements (FEEDING SUPPLEMENT, OSMOLITE 1.5 CAL,) LIQD Place 1,000 mLs into feeding tube continuous. 11/01/20   Desai, Rahul P, PA-C  Nutritional Supplements (FEEDING SUPPLEMENT, PROSOURCE TF,) liquid Place 45 mLs into feeding tube 3 (three) times daily. 11/01/20   Desai, Rahul P, PA-C  ondansetron (ZOFRAN) 4 MG/2ML SOLN  injection Inject 2 mLs (4 mg total) into the vein every 6 (six) hours as needed for nausea. 11/01/20   Desai, Rahul P, PA-C  oxyCODONE (OXY IR/ROXICODONE) 5 MG immediate release tablet Place 1 tablet (5 mg total) into feeding tube every 6 (six) hours. 11/01/20   Shearon Stalls,  Rahul P, PA-C  pantoprazole (PROTONIX) 40 MG injection Inject 40 mg into the vein at bedtime. 11/01/20   Desai, Rahul P, PA-C  polyethylene glycol (MIRALAX / GLYCOLAX) 17 g packet Place 17 g into feeding tube daily as needed for moderate constipation. 11/01/20   Desai, Rahul P, PA-C  prasugrel (EFFIENT) 10 MG TABS tablet Place 1 tablet (10 mg total) into feeding tube daily. 11/02/20   Desai, Rahul P, PA-C  QUEtiapine (SEROQUEL) 50 MG tablet Place 1 tablet (50 mg total) into feeding tube at bedtime. 11/01/20   Desai, Rahul P, PA-C  sertraline (ZOLOFT) 50 MG tablet Place 1 tablet (50 mg total) into feeding tube daily. 11/02/20   Desai, Rahul P, PA-C  sodium chloride flush (NS) 0.9 % SOLN 10-40 mLs by Intracatheter route every 12 (twelve) hours. 11/01/20   Desai, Rahul P, PA-C  sodium chloride flush (NS) 0.9 % SOLN 10-40 mLs by Intracatheter route as needed (flush). 11/01/20   Desai, Rahul P, PA-C  testosterone (ANDROGEL) 50 MG/5GM (1%) GEL Place 5 g onto the skin daily. 11/02/20   Desai, Rahul P, PA-C  thiamine 100 MG tablet Place 1 tablet (100 mg total) into feeding tube daily. 11/02/20   Germain Osgood, PA-C     Critical care time: Over 35 minutes spent in bedside evaluation chart review and critical care planning

## 2020-11-21 NOTE — Consult Note (Addendum)
WOC Nurse Consult Note: Patient receiving care in [facility] 2M13 Arrived to [facility] from LTAC this AM Reason for Consult: Sacral wound Wound type: Stage 2 coccyx wound Pressure Injury POA: Yes Measurement: 2.2 cm x 2 cm x 0.5 cm Wound bed: Red Drainage (amount, consistency, odor)  Serous Periwound: red but blanchable.  Dressing procedure/placement/frequency: Place small piece of Aquacel Advantage in coccyx wound. Secure with sacral foam dressing. Change daily or PRN soiling.   Monitor the wound area(s) for worsening of condition such as: Signs/symptoms of infection, increase in size, development of or worsening of odor, development of pain, or increased pain at the affected locations.   Notify the medical team if any of these develop.  Thank you for the consult. WOC nurse will not follow at this time.   Please re-consult the WOC team if needed.  Renaldo Reel Katrinka Blazing, MSN, RN, CMSRN, [facility], [facility] Wound Treatment Associate Pager [phone]

## 2020-11-21 NOTE — Progress Notes (Signed)
Initial Nutrition Assessment  DOCUMENTATION CODES:   Severe malnutrition in context of chronic illness  INTERVENTION:   Initiate tube feeds via PEG: - Start Osmolite 1.5 @ 40 ml/hr and advance by 10 ml q 4 hours to goal rate of 60 ml/hr (1440 ml/day) - ProSource TF 45 ml TID  Tube feeding regimen at goal rate provides 2280 kcal, 123 grams of protein, and 1097 ml of H2O.   NUTRITION DIAGNOSIS:   Severe Malnutrition related to chronic illness (chronic respiratory failure on ventilator support) as evidenced by severe fat depletion, severe muscle depletion, percent weight loss (29.8% weight loss in 3 months).  GOAL:   Patient will meet greater than or equal to 90% of their needs  MONITOR:   Vent status, Labs, Weight trends, TF tolerance, Skin, I & O's  REASON FOR ASSESSMENT:   Ventilator, Consult Enteral/tube feeding initiation and management  ASSESSMENT:   49 year old male who presented on 11/29 from [facility]. PMH of recent COVID pneumonia s/p trach/PEG, HTN, HLD. Pt found to have mucus obstructing the inner wall of his Bivona trach.   Discussed pt with RN and during ICU rounds. Consult received for TF initiation and management.  Spoke with pt's wife at bedside. She reports that pt was on Glucerna TF formula PTA but unsure of rate. Pt's wife reports that she expressed concern to [facility] [address] regarding pt's weight loss. Per pt's wife, pt weighed 215 lbs on 08/20/20 and has recently weighed as low as 143 lbs. Current weight is 150 lbs.  Per pt's wife's report, pt with a 64 lb weight loss since 08/20/20. This is a 29.8% weight loss in 3 months which is severe and significant for timeframe.  Patient is on ventilator support via trach. MV: 15.8 L/min Temp (24hrs), Avg:96.4 F (35.8 C), Min:92.5 F (33.6 C), Max:98.1 F (36.7 C) BP (cuff): 109/82 MAP (cuff): 89  Drips: Levophed: off this AM  Medications reviewed and include: SSI, IV abx, IV pepcid  Labs reviewed: potassium 5.3,  hemoglobin 8.8, lactic acid 2.3 CBG's: 142  UOP: 400 ml x 12 hours  NUTRITION - FOCUSED PHYSICAL EXAM:    Most Recent Value  Orbital Region Moderate depletion  Upper Arm Region Severe depletion  Thoracic and Lumbar Region Severe depletion  Buccal Region Moderate depletion  Temple Region Moderate depletion  Clavicle Bone Region Severe depletion  Clavicle and Acromion Bone Region Severe depletion  Scapular Bone Region Unable to assess  Dorsal Hand Moderate depletion  Patellar Region Severe depletion  Anterior Thigh Region Severe depletion  Posterior Calf Region Severe depletion  Edema (RD Assessment) None  Hair Reviewed  Eyes Reviewed  Mouth Reviewed  Skin Reviewed  Nails Reviewed       Diet Order:   Diet Order            Diet NPO time specified  Diet effective now                 EDUCATION NEEDS:   No education needs have been identified at this time  Skin:  Skin Assessment: Skin Integrity Issues: Stage II: left buttocks  Last BM:  no documented BM  Height:   Ht Readings from Last 1 Encounters:  10/29/20 5\' 10"  (1.778 m)    Weight:   Wt Readings from Last 1 Encounters:  11/21/20 68.4 kg    BMI:  Body mass index is 21.64 kg/m.  Estimated Nutritional Needs:   Kcal:  2200-2400  Protein:  110-125 grams  Fluid:  >/=  2.0 L    Gustavus Bryant, MS, RD, LDN Inpatient Clinical Dietitian Please see AMiON for contact information.

## 2020-11-21 NOTE — Progress Notes (Signed)
NAME:  Lawrence Freeman, MRN:  [id number], DOB:  November 05, 1971, LOS: 0 ADMISSION DATE:  11/21/2020, CONSULTATION DATE: 11/21/2020 [facility], CHIEF COMPLAINT: Hypercapnic respiratory failure  Brief History   49 year old recently diagnosed with Covid pneumonia along stay and brought hospital in [facility], I was asked to see due to worsening hypercapnic respiratory failure.  History of present illness   49 year old male treated at [facility].  He was trached reportedly profoundly weak.  Tracheostomy was done on 09/20/2020 and has since been replaced with a Bivona trach.  His trach does not have an inner cannula for cleaning.  Apparently through the night the patient became progressively hypercapnic with worsening in his mental status and dyssynchronous breathing with the ventilator.  Tidal volumes on a set pressure control of 28 cm of water were about 20 [facility] about [facility] this morning.  He was transferred to [facility], ICU emergently underwent bronchoscopy.  Bronchoscopy shows thick tenacious reddish mucoid material that is stubbornly adherent to the inner wall of the Bivona trach.  It also appears that the trach had migrated down almost to the level of the carina.  Believe that this material had obstructed or stenosed the inner cannula along with the end of the trach abutting the carina resulted in an inability for the patient to get appropriate tidal volumes.  Once this material was removed to the best of my ability, although there was a significant amount still adherent to the wall of the trach tube, tidal volumes were much more easily delivered.  We also padded the underneath of the trach tube as it left the stoma in order to pull it back from the carina.  These 2 changes resulted in marked  improvement in his tidal volumes from 200 up to almost 700.  With improvement presumably in his pH is blood pressure came up to the 130s over 80 and we will get a start reducing his Levophed.  Patient did not receive sedation for this due to obtundation. Will obtain chest x-ray CMP ABG CBC.  Patient stable at time of this dictation significantly improved post bronchoscopy  Past Medical History  Hypertension Hyperlipidemia Covid positive pneumonia Tracheostomy  Significant Hospital Events   Transferred to [facility], ICU  Consults:  PCCM  Procedures:  Bronchoscopy 11/29: thick reddish mucoid material adhered to bivona trach with migration of trach down to level of carina. Attempted to remove material and trach was   Significant Diagnostic Tests:  Chest x-ray performed on the floor reading was consistent with previous Covid pneumonia  Micro Data:  Respiratory culture 11/29 >>  Blood culture 11/29 >>  Antimicrobials:  Vancomycin 11/29 >> Cefepime 11/29 >>   Interim history/subjective:  NA  Objective   Blood pressure (!) 133/96, pulse 79, temperature (!) 92.5 F (33.6 C), temperature source Axillary, resp. rate (!) 27, SpO2 100 %.    Vent Mode: PCV FiO2 (%):  [60 %-100 %] 60 % Set Rate:  [30 bmp] 30 bmp PEEP:  [5 cmH20] 5 cmH20   Intake/Output Summary (Last 24 hours) at 11/21/2020 0708 Last data filed at 11/21/2020 0545 Gross per 24 hour  Intake --  Output 400 ml  Net -400 ml   There were no vitals filed for this visit.  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 Still on 50% FiO2.  - adjust back to prehospital vent settings as able  - working up for possible infectious etiology  - PT/OT   Sepsis Initially obtunded this morning hypotensive, hypothermic with leukocytosis requiring levo. Blood pressure and mentation have improved, now off levo. Differential includes pneumonia with hypoxic/hypercapnic respiratory failure, although now improving, UTI as unclear when chronic foley last changes, and stage III sacral ulcer. Appears to have been on cefepime/cipro [facility] - start vanc/cefepime  - obtain respiratory culture, blood culture pending - UA  - maintain MAP >65  Sacral Ulcer Stage III - wound care consulted to assess for tracking   Inferolateral ST wave elevations CAD s/p STEMI 2020 with PCI stentx2  Asymptomic. Last ECG available to compare from early November. Echo at this time within normal limits.  - check trops  - repeat ECG this afternoon  - wife will fax previous STEMI records   Hyperkalemia K yesterday evening 5.7  - recheck K - 10g lokelma now   Anxiety Cont. seroquel qhs. Holding other antianxiety and centrally acting meds due to hypotension and earlier obtundation - see med list in physical chart for updated med rec    Best practice (evaluated daily)   Diet: Will consult nutrition Pain/Anxiety/Delirium protocol (if indicated): N/A VAP protocol (if indicated): Continue current ventilatory modes discussed with respiratory DVT prophylaxis: Lovenox GI prophylaxis: Pepcid Glucose control: Monitor Mobility: Bedrest Family communication: wife updated at bedside 11/29 Code Status: Full Disposition: Continue ICU care  Labs    CBC: Recent Labs  Lab 11/16/20 0427 11/17/20 0340 11/21/20 0027 11/21/20 0616  WBC 16.3* 16.1* 27.0*  --   HGB 10.4* 10.1* 10.6* 9.9*  HCT 30.8* 30.0* 31.8* 29.0*  MCV 96.3 97.4 95.5  --   PLT 483* 427* 259  --     Basic Metabolic Panel: Recent Labs  Lab 11/16/20 0427 11/17/20 0340 11/18/20 0414 11/21/20 0027 11/21/20 0616  NA 135 136  --  137 137  K 4.5 5.0 4.9 5.7* 5.3*  CL 90* 92*  --  88*  --   CO2 31 35*  --  36*  --   GLUCOSE 188* 176*  --  254*  --   BUN 47* 45*  --  51*  --   CREATININE 0.67 0.48*  --  0.59*  --   CALCIUM 9.2 9.2  --  9.7  --   MG  --  2.1  --  2.0  --    GFR: CrCl cannot be calculated (Unknown ideal weight.). Recent Labs  Lab 11/16/20 0427 11/17/20 0340 11/21/20 0027 11/21/20 0336  WBC 16.3* 16.1* 27.0*  --   LATICACIDVEN  --   --   --  2.3*    Liver Function Tests: No results for input(s): AST, ALT, ALKPHOS, BILITOT, PROT, ALBUMIN in the last 168 hours.  No results for input(s): LIPASE, AMYLASE in the last 168 hours. No results for input(s): AMMONIA in the last 168 hours.  ABG    Component Value Date/Time   PHART 7.361 11/21/2020 0616   PCO2ART 78.7 (HH) 11/21/2020 0616   PO2ART 310 (H) 11/21/2020 0616   HCO3 44.5 (H) 11/21/2020 0616   TCO2 47 (H) 11/21/2020 0616   O2SAT 100.0 11/21/2020 0616     Coagulation Profile: No results for input(s): INR, PROTIME in the last 168 hours.  Cardiac Enzymes: No results for input(s): CKTOTAL, CKMB, CKMBINDEX, TROPONINI in the last 168 hours.  HbA1C: Hgb A1c MFr Bld  Date/Time Value Ref Range Status  10/12/2020 05:54 AM 6.5 (H) 4.8 - 5.6 % Final    Comment:    (NOTE)         Prediabetes: 5.7 - 6.4         Diabetes: >6.4         Glycemic control for adults with diabetes: <7.0     CBG: No results for input(s): GLUCAP in the last 168 hours.     Guinevere Scarlet A, DO 11/21/2020, 7:08 AM Pager: [phone]

## 2020-11-22 DIAGNOSIS — U071 COVID-19: Secondary | ICD-10-CM

## 2020-11-22 DIAGNOSIS — J209 Acute bronchitis, unspecified: Secondary | ICD-10-CM

## 2020-11-22 DIAGNOSIS — J9621 Acute and chronic respiratory failure with hypoxia: Secondary | ICD-10-CM

## 2020-11-22 DIAGNOSIS — J1282 Pneumonia due to coronavirus disease 2019: Secondary | ICD-10-CM

## 2020-11-22 DIAGNOSIS — Z930 Tracheostomy status: Secondary | ICD-10-CM

## 2020-11-22 LAB — GLUCOSE, CAPILLARY
Glucose-Capillary: 122 mg/dL — ABNORMAL HIGH (ref 70–99)
Glucose-Capillary: 192 mg/dL — ABNORMAL HIGH (ref 70–99)
Glucose-Capillary: 208 mg/dL — ABNORMAL HIGH (ref 70–99)
Glucose-Capillary: 210 mg/dL — ABNORMAL HIGH (ref 70–99)
Glucose-Capillary: 226 mg/dL — ABNORMAL HIGH (ref 70–99)
Glucose-Capillary: 242 mg/dL — ABNORMAL HIGH (ref 70–99)
Glucose-Capillary: 259 mg/dL — ABNORMAL HIGH (ref 70–99)

## 2020-11-22 LAB — BASIC METABOLIC PANEL
Anion gap: 12 (ref 5–15)
BUN: 36 mg/dL — ABNORMAL HIGH (ref 6–20)
CO2: 31 mmol/L (ref 22–32)
Calcium: 8.3 mg/dL — ABNORMAL LOW (ref 8.9–10.3)
Chloride: 96 mmol/L — ABNORMAL LOW (ref 98–111)
Creatinine, Ser: 0.54 mg/dL — ABNORMAL LOW (ref 0.61–1.24)
GFR, Estimated: >60 mL/min (ref >60)
Glucose, Bld: 265 mg/dL — ABNORMAL HIGH (ref 70–99)
Potassium: 3.3 mmol/L — ABNORMAL LOW (ref 3.5–5.1)
Sodium: 139 mmol/L (ref 135–145)

## 2020-11-22 LAB — CBC
HCT: 25.5 % — ABNORMAL LOW (ref 39.0–52.0)
Hemoglobin: 8.2 g/dL — ABNORMAL LOW (ref 13.0–17.0)
MCH: 32.5 pg (ref 26.0–34.0)
MCHC: 32.2 g/dL (ref 30.0–36.0)
MCV: 101.2 fL — ABNORMAL HIGH (ref 80.0–100.0)
Platelets: 198 10*3/uL (ref 150–400)
RBC: 2.52 MIL/uL — ABNORMAL LOW (ref 4.22–5.81)
RDW: 17.7 % — ABNORMAL HIGH (ref 11.5–15.5)
WBC: 11.3 10*3/uL — ABNORMAL HIGH (ref 4.0–10.5)
nRBC: 0.3 % — ABNORMAL HIGH (ref 0.0–0.2)

## 2020-11-22 LAB — PHOSPHORUS
Phosphorus: 2.7 mg/dL (ref 2.5–4.6)
Phosphorus: 3.2 mg/dL (ref 2.5–4.6)

## 2020-11-22 LAB — MAGNESIUM
Magnesium: 1.8 mg/dL (ref 1.7–2.4)
Magnesium: 1.8 mg/dL (ref 1.7–2.4)

## 2020-11-22 MED ORDER — SODIUM CHLORIDE 0.9 % IV BOLUS
500.0000 mL | Freq: Once | INTRAVENOUS | Status: AC
  Administered 2020-11-22: 500 mL via INTRAVENOUS

## 2020-11-22 MED ORDER — ORAL CARE MOUTH RINSE
15.0000 mL | OROMUCOSAL | Status: DC
  Administered 2020-11-22 – 2020-11-23 (×15): 15 mL via OROMUCOSAL

## 2020-11-22 MED ORDER — IPRATROPIUM-ALBUTEROL 0.5-2.5 (3) MG/3ML IN SOLN
3.0000 mL | Freq: Four times a day (QID) | RESPIRATORY_TRACT | Status: DC
  Administered 2020-11-22 – 2020-11-23 (×3): 3 mL via RESPIRATORY_TRACT
  Filled 2020-11-22 (×2): qty 3

## 2020-11-22 MED ORDER — IPRATROPIUM-ALBUTEROL 0.5-2.5 (3) MG/3ML IN SOLN: 3.0000 mL | Freq: Three times a day (TID) | RESPIRATORY_TRACT | Status: DC

## 2020-11-22 MED ORDER — SODIUM CHLORIDE 0.9 % IV SOLN
2.0000 g | INTRAVENOUS | Status: DC
  Administered 2020-11-22: 2 g via INTRAVENOUS
  Filled 2020-11-22: qty 20

## 2020-11-22 MED ORDER — POTASSIUM CHLORIDE 20 MEQ PO PACK
40.0000 meq | PACK | Freq: Once | ORAL | Status: AC
  Administered 2020-11-22: 40 meq
  Filled 2020-11-22: qty 2

## 2020-11-22 MED ORDER — PANTOPRAZOLE SODIUM 40 MG PO PACK
40.0000 mg | PACK | Freq: Every day | ORAL | Status: DC
  Administered 2020-11-22 – 2020-11-23 (×2): 40 mg
  Filled 2020-11-22 (×2): qty 20

## 2020-11-22 MED ORDER — INSULIN GLARGINE 100 UNIT/ML ~~LOC~~ SOLN
15.0000 [IU] | Freq: Every day | SUBCUTANEOUS | Status: DC
  Administered 2020-11-22 – 2020-11-23 (×2): 15 [IU] via SUBCUTANEOUS
  Filled 2020-11-22 (×2): qty 0.15

## 2020-11-22 MED ORDER — LISINOPRIL 10 MG PO TABS
5.0000 mg | ORAL_TABLET | Freq: Every day | ORAL | Status: DC
  Administered 2020-11-22 – 2020-11-23 (×2): 5 mg
  Filled 2020-11-22 (×2): qty 1

## 2020-11-22 MED ORDER — INSULIN ASPART 100 UNIT/ML ~~LOC~~ SOLN
0.0000 [IU] | SUBCUTANEOUS | Status: DC
  Administered 2020-11-22: 5 [IU] via SUBCUTANEOUS
  Administered 2020-11-22: 2 [IU] via SUBCUTANEOUS
  Administered 2020-11-22 (×2): 5 [IU] via SUBCUTANEOUS
  Administered 2020-11-22: 8 [IU] via SUBCUTANEOUS
  Administered 2020-11-22 – 2020-11-23 (×3): 3 [IU] via SUBCUTANEOUS
  Administered 2020-11-23: 5 [IU] via SUBCUTANEOUS
  Administered 2020-11-23: 3 [IU] via SUBCUTANEOUS

## 2020-11-22 MED ORDER — IPRATROPIUM-ALBUTEROL 0.5-2.5 (3) MG/3ML IN SOLN
RESPIRATORY_TRACT | Status: AC
  Filled 2020-11-22: qty 3

## 2020-11-22 MED ORDER — CHLORHEXIDINE GLUCONATE 0.12% ORAL RINSE (MEDLINE KIT)
15.0000 mL | Freq: Two times a day (BID) | OROMUCOSAL | Status: DC
  Administered 2020-11-22 – 2020-11-23 (×3): 15 mL via OROMUCOSAL

## 2020-11-22 MED ORDER — SODIUM CHLORIDE 0.9 % IV SOLN
INTRAVENOUS | Status: DC | PRN
  Administered 2020-11-22: 250 mL via INTRAVENOUS

## 2020-11-22 NOTE — Progress Notes (Signed)
Inpatient Diabetes Program Recommendations  AACE/ADA: New Consensus Statement on Inpatient Glycemic Control (2015)  Target Ranges:  Prepandial:   less than 140 mg/dL      Peak postprandial:   less than 180 mg/dL (1-2 hours)      Critically ill patients:  140 - 180 mg/dL   Results for IYAN, FLETT (MRN [id number]) as of 11/22/2020 09:40  Ref. 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 at [facility] who unfortunately developed pneumothorax after a tracheostomy change complication  Insulin Meds from Facility: Lantus 10 units BID              Novolog 0-20 units Q4 hours  Current Orders: Novolog Moderate Correction Scale/ SSI (0-15 units) Q4 hours    Tube Feeds running 60cc/hr   MD- Per Home Med rec, patient has been getting Lantus and Novolog Insulins at [facility]  Note CBGs >200 since 12am  Please consider adding Lantus 7 units BID (0.2 unts/kg split dose)    --Will follow patient during hospitalization--  Ambrose Finland RN, MSN, CDE Diabetes Coordinator Inpatient Glycemic Control Team Team Pager: [phone] (8a-5p)

## 2020-11-22 NOTE — Evaluation (Signed)
Physical Therapy Evaluation Patient Details Name: Lawrence Freeman MRN: [id number] DOB: [birth year] Today's Date: 11/22/2020   History of Present Illness  49 yo admitted from [facility] 11/29 with hypercapnea and dyssynchronous on vent . PMHx:Pt Covid (+) 08/15/20 with admission at [facility] then transferred to [facility] 08/30/20 and intubated 9/13, trach 9/28, 10/27 PEG. At [facility] from Oct -11/2. 11/2 admitted to [facility] due to respiratory failure with trach exchange returned to [facility] 11/9. CAD, HTN, HLD, gout  Clinical Impression  Pt very pleasant, eager to move demonstrating generalized weakness and myopathy. Pt able to assist with all bed mobility and tolerate sitting EOB 7 min with minguard assist. Pt was a long haul truck driver, married with 2 daughters. Pt states he has only received therapy 1x/wk at [facility] and desires more to regain function. Pt with impaired strength and function complicated by cardiopulmonary status and prolonged hospitalization and bedrest. Pt will benefit from acute therapy to maximize mobility, safety and function to decrease burden of care.  SpO2 96% on 40% FiO2 pressure control with peep of 5 HR 107-135 with activity 137/92 supine, 173/102 sitting EOB 5 min    Follow Up Recommendations LTACH;Supervision/Assistance - 24 hour    Equipment Recommendations  Wheelchair (measurements PT);Hospital bed;Wheelchair cushion (measurements PT)    Recommendations for Other Services       Precautions / Restrictions Precautions Precautions: Fall;Other (comment) Precaution Comments: trach, vent, PEG Restrictions Weight Bearing Restrictions: No      Mobility  Bed Mobility Overal bed mobility: Needs Assistance Bed Mobility: Rolling;Sidelying to Sit;Sit to Supine Rolling: Mod assist;+2 for physical assistance Sidelying to sit: Max assist;+2 for physical assistance   Sit to supine: Max assist;+2 for physical assistance   General bed mobility comments:  assist to fully bend left knee into flexion, pt hooking LUE with PT arm to roll with mod +2 to roll. Side to sit max +2 with assist to clear legs off surface and lift trunk with pt hooking with LUE and pushing with RUE into bed to achieve sitting. Max +2 assist to return to supine. Total +2 to slide toward [facility]    Transfers                 General transfer comment: did not attempt this session due to increased WOB with sitting EOB  Ambulation/Gait                Stairs            Wheelchair Mobility    Modified Rankin (Stroke Patients Only)       Balance Overall balance assessment: Needs assistance Sitting-balance support: No upper extremity supported;Feet supported Sitting balance-Leahy Scale: Good Sitting balance - Comments: Pt able to sit EOB with minguard for lines and safety. Pt with initial dizziness and wheezing improved with sitting EOB 7 min prior to fatigue. Flexed trunk and forward head in sitting                                     Pertinent Vitals/Pain Pain Assessment: No/denies pain    Home Living Family/patient expects to be discharged to:: Other (Comment) Living Arrangements: Spouse/significant other               Additional Comments: [facility]    Prior Function Level of Independence: Needs assistance   Gait / Transfers Assistance Needed: Total assist - max assist transfers and  most likely use of lift since hospitalization in August. Per his report has only sat EOB 3x and has not been to a chair since August  ADL's / Homemaking Assistance Needed: total care        Hand Dominance        Extremity/Trunk Assessment   Upper Extremity Assessment Upper Extremity Assessment: Defer to OT evaluation    Lower Extremity Assessment RLE Deficits / Details: 2-/5 ( knee flexion, dorsiflexion, hip Abduct) ,hip adduction3/5 LLE Deficits / Details: 2-/5 (knee flexion, dorsiflexion, hip abduction) hip adduction 3/5     Cervical / Trunk Assessment Cervical / Trunk Assessment: Other exceptions Cervical / Trunk Exceptions: forward head, rounded shoulders  Communication   Communication: Tracheostomy  Cognition Arousal/Alertness: Awake/alert Behavior During Therapy: Flat affect Overall Cognitive Status: Difficult to assess                                 General Comments: pt mouthing words and using cell phone to communicate (texting), responding appropriately and providing PLOF      General Comments      Exercises     Assessment/Plan    PT Assessment Patient needs continued PT services  PT Problem List Decreased activity tolerance;Decreased balance;Decreased strength;Decreased range of motion;Decreased mobility;Decreased knowledge of use of DME;Decreased safety awareness;Decreased knowledge of precautions;Cardiopulmonary status limiting activity       PT Treatment Interventions DME instruction;Functional mobility training;Therapeutic activities;Therapeutic exercise;Balance training;Patient/family education    PT Goals (Current goals can be found in the Care Plan section)  Acute Rehab PT Goals Patient Stated Goal: be able to go home, walk and drive again PT Goal Formulation: With patient Time For Goal Achievement: 12/06/20 Potential to Achieve Goals: Fair    Frequency Min 3X/week   Barriers to discharge Decreased caregiver support      Co-evaluation PT/OT/SLP Co-Evaluation/Treatment: Yes Reason for Co-Treatment: Complexity of the patient's impairments (multi-system involvement);For patient/therapist safety PT goals addressed during session: Mobility/safety with mobility;Balance;Strengthening/ROM         AM-PAC PT "6 Clicks" Mobility  Outcome Measure Help needed turning from your back to your side while in a flat bed without using bedrails?: Total Help needed moving from lying on your back to sitting on the side of a flat bed without using bedrails?: Total Help  needed moving to and from a bed to a chair (including a wheelchair)?: Total Help needed standing up from a chair using your arms (e.g., wheelchair or bedside chair)?: Total Help needed to walk in hospital room?: Total   6 Click Score: 5    End of Session   Activity Tolerance: Patient tolerated treatment well Patient left: in bed;with call bell/phone within reach;with SCD's reapplied Nurse Communication: Mobility status;Need for lift equipment PT Visit Diagnosis: Muscle weakness (generalized) (M62.81);Other abnormalities of gait and mobility (R26.89)    Time: 5681-2751 PT Time Calculation (min) (ACUTE ONLY): 38 min   Charges:   PT Evaluation $PT Eval High Complexity: 1 High PT Treatments $Therapeutic Activity: 8-22 mins        Anwar Crill P, PT Acute Rehabilitation Services Pager: [phone] Office: [phone]   Carlina Derks B Amilee Janvier 11/22/2020, 11:12 AM

## 2020-11-22 NOTE — Progress Notes (Signed)
NAME:  Lawrence Freeman, MRN:  [id number], DOB:  [birth year], LOS: 1 ADMISSION DATE:  11/21/2020, CONSULTATION DATE: 11/21/2020 REFERRING MD: [facility] hospitalist, CHIEF COMPLAINT: Hypercapnic respiratory failure  Brief History   49 year old recently diagnosed with Covid pneumonia along stay and brought hospital in [facility] ICU transferred to [facility] several weeks ago, I was asked to see due to worsening hypercapnic respiratory failure.  History of present illness   49 year old male treated at [facility] for Covid pneumonia and transferred to [facility] sometime about 3 to 4 weeks ago.  He was trached reportedly profoundly weak.  Tracheostomy was done on 09/20/2020 and has since been replaced with a Bivona trach.  His trach does not have an inner cannula for cleaning.  Apparently through the night the patient became progressively hypercapnic with worsening in his mental status and dyssynchronous breathing with the ventilator.  Tidal volumes on a set pressure control of 28 cm of water were about 20 on my arrival to [facility] at about 530 this morning.  He was transferred to [facility] ICU emergently underwent bronchoscopy.  Bronchoscopy shows thick tenacious reddish mucoid material that is stubbornly adherent to the inner wall of the Bivona trach.  It also appears that the trach had migrated down almost to the level of the carina.  Believe that this material had obstructed or stenosed the inner cannula along with the end of the trach abutting the carina resulted in an inability for the patient to get appropriate tidal volumes.  Once this material was removed to the best of my ability, although there was a significant amount still adherent to the wall of the trach tube, tidal volumes were much more easily delivered.  We also padded the underneath of the trach tube as it left the stoma in order to pull it back from the carina.  These 2 changes resulted in marked  improvement in his tidal volumes from 200 up to almost 700.  With improvement presumably in his pH is blood pressure came up to the 130s over 80 and we will get a start reducing his Levophed.  Patient did not receive sedation for this due to obtundation. Will obtain chest x-ray CMP ABG CBC.  Patient stable at time of this dictation significantly improved post bronchoscopy  Past Medical History  Hypertension Hyperlipidemia Covid positive pneumonia Tracheostomy  Significant Hospital Events   Transferred to [facility] ICU  Consults:  PCCM  Procedures:  Bronchoscopy 11/29: thick reddish mucoid material adhered to bivona trach with migration of trach down to level of carina. Attempted to remove material and trach was   Significant Diagnostic Tests:  Chest x-ray performed on the floor reading was consistent with previous Covid pneumonia  Micro Data:  Respiratory culture 11/29 >>  Blood culture 11/29 >>  Antimicrobials:  Vancomycin 11/29 >> Cefepime 11/29 >>   Interim history/subjective:  Feeling well this morning. Not having any pain or shortness of breath. Just worked with PT and was able to sit up in bed for the 4th time since August.   Objective   Blood pressure 120/80, pulse (!) 123, temperature 97.9 F (36.6 C), temperature source Oral, resp. rate (!) 30, weight 69.7 kg, SpO2 96 %.    Vent Mode: PCV FiO2 (%):  [40 %-60 %] 40 % Set Rate:  [30 bmp] 30 bmp PEEP:  [5 cmH20] 5 cmH20 Plateau Pressure:  [25 cmH20-27 cmH20] 25 cmH20   Intake/Output Summary (Last 24 hours) at 11/22/2020 [phone]  Last data filed at 11/22/2020 0600 Gross per 24 hour  Intake 2284.79 ml  Output 1250 ml  Net 1034.79 ml   Filed Weights   11/21/20 0811 11/22/20 0500  Weight: 68.4 kg 69.7 kg    Examination: General: Thin male, NAD  HENT: Within normal limits, trach in place Lungs: CTA bilaterally, no wheezing rales or rhonchi, on 40% Fi02 Cardiovascular: Regular rate and rhythm, no m/r/g   Abdomen: soft, NTTP, normal BS  Extremities: Thin, no edema, moving all extremities  Neuro: alert, following commands Skin: stage III sacral ulcer, no purulent drainage or surrounding erythema  Resolved Hospital Problem list   Sepsis Hyperkalemia   Assessment & Plan:   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  - back on home ventilation settings.   - cont. Duonebs, decrease from q4 to q8 - PT/OT  - stable for transfer back to [facility], insurance authorization pending   Sepsis  Resolved. Possible secondary to pneumonia. Off pressors.  - cont. 7 days of abx total, switch to ceftriaxone   - cultures NGTD  Sacral Ulcer Stage III - wound care consulted  Inferolateral ST wave elevations CAD s/p STEMI 2020 with PCI stentx2  HTN  Asymptomic. Last ECG available to compare from early November. Echo at this time within normal limits. Trops yesterday flat. Repeat ECG with sinus tachycardia only.  - cont. prasugrel 10 mg - start losartan 5mg , home dose 10mg    Anxiety - cont. Home clonazepam and seroquel 50 mg qhs.   TIIDM - start lantus 15U qd  - cont. SSI moderate  - add qhs SSI   Best practice (evaluated daily)   Diet: tube feeds,  Pain/Anxiety/Delirium protocol (if indicated): N/A VAP protocol (if indicated): Continue current ventilatory modes discussed with respiratory DVT prophylaxis: Lovenox GI prophylaxis: protonix Glucose control: Monitor Mobility: Bedrest Family communication: daughter updated at bedside 11/30 Code Status: Full Disposition: transfer back to [facility] when able   Labs   CBC: Recent Labs  Lab 11/16/20 0427 11/17/20 0340 11/21/20 0027 11/21/20 0616 11/21/20 1046  WBC 16.3* 16.1* 27.0*  --  21.1*  NEUTROABS  --   --   --   --  18.3*  HGB  10.4* 10.1* 10.6* 9.9* 8.9*  HCT 30.8* 30.0* 31.8* 29.0* 26.6*  MCV 96.3 97.4 95.5  --  95.7  PLT 483* 427* 259  --  237    Basic Metabolic Panel: Recent Labs  Lab 11/16/20 0427 11/16/20 0427 11/17/20 0340 11/18/20 0414 11/21/20 0027 11/21/20 0616 11/21/20 1046 11/21/20 1714  NA 135  --  136  --  137 137 140  --   K 4.5   < > 5.0 4.9 5.7* 5.3* 3.9  --   CL 90*  --  92*  --  88*  --  93*  --   CO2 31  --  35*  --  36*  --  36*  --   GLUCOSE 188*  --  176*  --  254*  --  146*  --   BUN 47*  --  45*  --  51*  --  51*  --   CREATININE 0.67  --  0.48*  --  0.59*  --  0.59*  --   CALCIUM 9.2  --  9.2  --  9.7  --  9.2  --   MG  --   --  2.1  --  2.0  --  1.8 1.8  PHOS  --   --   --   --   --   --  3.1 3.2   < > = values in this interval not displayed.   GFR: Estimated Creatinine Clearance: 108.9 mL/min (A) (by C-G formula based on SCr of 0.59 mg/dL (L)). Recent Labs  Lab 11/16/20 0427 11/17/20 0340 11/21/20 0027 11/21/20 0336 11/21/20 1046  PROCALCITON  --   --   --   --  0.26  WBC 16.3* 16.1* 27.0*  --  21.1*  LATICACIDVEN  --   --   --  2.3*  --     Liver Function Tests: Recent Labs  Lab 11/21/20 1046  AST 20  ALT 29  ALKPHOS 73  BILITOT 0.5  PROT 5.1*  ALBUMIN 2.9*   No results for input(s): LIPASE, AMYLASE in the last 168 hours. No results for input(s): AMMONIA in the last 168 hours.  ABG    Component Value Date/Time   PHART 7.361 11/21/2020 0616   PCO2ART 78.7 (HH) 11/21/2020 0616   PO2ART 310 (H) 11/21/2020 0616   HCO3 44.5 (H) 11/21/2020 0616   TCO2 47 (H) 11/21/2020 0616   O2SAT 100.0 11/21/2020 0616     Coagulation Profile: No results for input(s): INR, PROTIME in the last 168 hours.  Cardiac Enzymes: No results for input(s): CKTOTAL, CKMB, CKMBINDEX, TROPONINI in the last 168 hours.  HbA1C: Hgb A1c MFr Bld  Date/Time Value Ref Range Status  10/12/2020 05:54 AM 6.5 (H) 4.8 - 5.6 % Final    Comment:    (NOTE)         Prediabetes: 5.7 -  6.4         Diabetes: >6.4         Glycemic control for adults with diabetes: <7.0     CBG: Recent Labs  Lab 11/21/20 1454 11/21/20 1914 11/21/20 2341 11/22/20 0126 11/22/20 0334  GLUCAP 100* 148* 219* 226* 242*       Markeya Mincy A, DO 11/22/2020, 6:33 AM Pager: [phone]

## 2020-11-22 NOTE — Evaluation (Addendum)
Occupational Therapy Evaluation Patient Details Name: Lawrence Freeman MRN: [id number] DOB: 11/20/1971 Today's Date: 11/22/2020    History of Present Illness 49 yo admitted from [facility] 11/29 with hypercapnea and dyssynchronous on vent . PMHx:Pt Covid (+) 08/15/20 with admission at [facility] then transferred to [facility] 08/30/20 and intubated 9/13, trach 9/28, 10/27 PEG. At [facility] LTACH from Oct -11/2. 11/2 admitted to [facility] due to respiratory failure with trach exchange returned to [facility] 11/9. CAD, HTN, HLD, gout   Clinical Impression   Patient admitted for above and limited by problem list below, including decreased activity tolerance, generalized weakness, impaired balance, decreased cardiopulmonary function on vent via trach. Admitted from [facility], reports getting minimal therapy and only sitting up 3x since having COVID.  Currently requires setup for grooming bed level, mod assist for UB ADLs and total assist for LB ADLS, max assist +2 for bed mobility and min guard sitting EOB statically. Believe he will benefit from continued OT services while admitted and after dc to promote increased independence with ADLs and mobility to decrease burden of care.  Will follow.   Pt on vent via trach at 40% FiO2, PEEP 5, SpO2 96%. HR 107-135 with activity, BP supine 137/92 and EOB 173/102    Follow Up Recommendations  LTACH;Supervision/Assistance - 24 hour    Equipment Recommendations  Other (comment) (TBD at next venue of care )    Recommendations for Other Services       Precautions / Restrictions Precautions Precautions: Fall;Other (comment) Precaution Comments: trach, vent, PEG Restrictions Weight Bearing Restrictions: No      Mobility Bed Mobility Overal bed mobility: Needs Assistance Bed Mobility: Rolling;Sidelying to Sit;Sit to Supine Rolling: Mod assist;+2 for physical assistance Sidelying to sit: Max assist;+2 for physical assistance   Sit to supine: Max assist;+2  for physical assistance   General bed mobility comments: assist to fully bend left knee into flexion, pt hooking LUE with PT arm to roll with mod +2 to roll. Side to sit max +2 with assist to clear legs off surface and lift trunk with pt hooking with LUE and pushing with RUE into bed to achieve sitting. Max +2 assist to return to supine. Total +2 to slide toward [facility]    Transfers                 General transfer comment: not assessed due to increased WOB at EOB     Balance Overall balance assessment: Needs assistance Sitting-balance support: No upper extremity supported;Feet supported Sitting balance-Leahy Scale: Fair Sitting balance - Comments: able to sit statically with min guard for safety, limited dynamic balance with mild posterior lean without UE support; dizziness and wheezing improved sitting EOB x 7 minutes, fatigues easily                                    ADL either performed or assessed with clinical judgement   ADL Overall ADL's : Needs assistance/impaired Eating/Feeding: NPO   Grooming: Set up;Wash/dry hands;Wash/dry face;Bed level   Upper Body Bathing: Minimal assistance;Bed level   Lower Body Bathing: Maximal assistance;+2 for physical assistance;+2 for safety/equipment;Bed level   Upper Body Dressing : Moderate assistance;Bed level   Lower Body Dressing: Total assistance;+2 for physical assistance;+2 for safety/equipment;Bed level               Functional mobility during ADLs: Maximal assistance;+2 for physical assistance;+2 for safety/equipment General  ADL Comments: patient limited by balance, strength, activity tolerance     Vision   Vision Assessment?: No apparent visual deficits     Perception     Praxis      Pertinent Vitals/Pain Pain Assessment: No/denies pain     Hand Dominance Right   Extremity/Trunk Assessment Upper Extremity Assessment Upper Extremity Assessment: Generalized weakness (grossly up to 90* FF)    Lower Extremity Assessment Lower Extremity Assessment: Defer to PT evaluation RLE Deficits / Details: 2-/5 ( knee flexion, dorsiflexion, hip Abduct) ,hip adduction3/5 LLE Deficits / Details: 2-/5 (knee flexion, dorsiflexion, hip abduction) hip adduction 3/5   Cervical / Trunk Assessment Cervical / Trunk Assessment: Other exceptions Cervical / Trunk Exceptions: forward head, rounded shoulders   Communication Communication Communication: Tracheostomy   Cognition Arousal/Alertness: Awake/alert Behavior During Therapy: Flat affect Overall Cognitive Status: Difficult to assess                                 General Comments: pt mouthing words and using cell phone to communicate (texting), responding appropriately and providing PLOF   General Comments  vitals monitored throughout session    Exercises     Shoulder Instructions      Home Living Family/patient expects to be discharged to:: Other (Comment) Living Arrangements: Spouse/significant other                               Additional Comments: [facility]      Prior Functioning/Environment Level of Independence: Needs assistance  Gait / Transfers Assistance Needed: Total assist - max assist transfers and most likely use of lift since hospitalization in August. Per his report has only sat EOB 3x and has not been to a chair since August ADL's / Homemaking Assistance Needed: total care since at [facility], independent prior to COVID             OT Problem List: Decreased strength;Decreased activity tolerance;Impaired balance (sitting and/or standing);Decreased safety awareness;Decreased knowledge of use of DME or AE;Decreased knowledge of precautions;Cardiopulmonary status limiting activity      OT Treatment/Interventions: Self-care/ADL training;Therapeutic exercise;DME and/or AE instruction;Therapeutic activities;Patient/family education;Balance training;Energy conservation    OT Goals(Current goals  can be found in the care plan section) Acute Rehab OT Goals Patient Stated Goal: be able to go home, walk and drive again OT Goal Formulation: With patient Time For Goal Achievement: 12/06/20 Potential to Achieve Goals: Good ADL Goals Pt Will Perform Grooming: with min guard assist;sitting Pt Will Perform Upper Body Bathing: with min assist;sitting Pt Will Transfer to Toilet: with max assist;with +2 assist;squat pivot transfer;stand pivot transfer;bedside commode Pt/caregiver will Perform Home Exercise Program: Increased strength;Both right and left upper extremity;With written HEP provided;With Supervision Additional ADL Goal #1: Patient will complete bed mobility with min assist +2 as precursor to ADLs.  OT Frequency: Min 3X/week   Barriers to D/C:            Co-evaluation PT/OT/SLP Co-Evaluation/Treatment: Yes Reason for Co-Treatment: Complexity of the patient's impairments (multi-system involvement);For patient/therapist safety PT goals addressed during session: Mobility/safety with mobility;Balance;Strengthening/ROM OT goals addressed during session: ADL's and self-care      AM-PAC OT "6 Clicks" Daily Activity     Outcome Measure Help from another person eating meals?: Total Help from another person taking care of personal grooming?: A Little Help from another person toileting, which includes using toliet, bedpan,  or urinal?: Total Help from another person bathing (including washing, rinsing, drying)?: A Lot Help from another person to put on and taking off regular upper body clothing?: A Lot Help from another person to put on and taking off regular lower body clothing?: Total 6 Click Score: 10   End of Session Nurse Communication: Mobility status;Precautions  Activity Tolerance: Patient tolerated treatment well Patient left: in bed;with call bell/phone within reach;with bed alarm set;Other (comment) (bed in chair position )  OT Visit Diagnosis: Other abnormalities of  gait and mobility (R26.89);Muscle weakness (generalized) (M62.81)                Time: 3329-5188 OT Time Calculation (min): 38 min Charges:  OT General Charges $OT Visit: 1 Visit OT Evaluation $OT Eval High Complexity: 1 High  Barry Brunner, OT Acute Rehabilitation Services Pager [phone] Office [phone]   Chancy Milroy 11/22/2020, 12:39 PM

## 2020-11-22 NOTE — Progress Notes (Signed)
eLink Physician-Brief Progress Note Patient Name: Lawrence Freeman DOB: 1971-03-07 MRN: [id number]   Date of Service  11/22/2020  HPI/Events of Note  Notifed of HR 120s-130s, CBG 219. Tube feeding started today not yet at goal Temp 99.5  eICU Interventions  Ordered a trial of 500 cc NS bolus Already has SSI ordered changed to q 4 and increased dose range     Intervention Category Major Interventions: Other:;Hyperglycemia - active titration of insulin therapy  Darl Pikes 11/22/2020, 12:42 AM

## 2020-11-23 ENCOUNTER — Inpatient Hospital Stay (HOSPITAL_COMMUNITY): Payer: BC Managed Care – PPO

## 2020-11-23 ENCOUNTER — Other Ambulatory Visit (HOSPITAL_COMMUNITY): Payer: Self-pay

## 2020-11-23 ENCOUNTER — Inpatient Hospital Stay
Admission: AD | Admit: 2020-11-23 | Discharge: 2021-02-15 | Disposition: A | Discharge status: Rehabilitation Facility | Payer: BC Managed Care – PPO | Source: Other Acute Inpatient Hospital | Attending: Internal Medicine | Admitting: Internal Medicine

## 2020-11-23 DIAGNOSIS — J9621 Acute and chronic respiratory failure with hypoxia: Secondary | ICD-10-CM | POA: Diagnosis present

## 2020-11-23 DIAGNOSIS — A419 Sepsis, unspecified organism: Secondary | ICD-10-CM

## 2020-11-23 DIAGNOSIS — R0989 Other specified symptoms and signs involving the circulatory and respiratory systems: Secondary | ICD-10-CM

## 2020-11-23 DIAGNOSIS — D729 Disorder of white blood cells, unspecified: Secondary | ICD-10-CM

## 2020-11-23 DIAGNOSIS — E43 Unspecified severe protein-calorie malnutrition: Secondary | ICD-10-CM | POA: Insufficient documentation

## 2020-11-23 DIAGNOSIS — Z4682 Encounter for fitting and adjustment of non-vascular catheter: Secondary | ICD-10-CM

## 2020-11-23 DIAGNOSIS — U071 COVID-19: Secondary | ICD-10-CM | POA: Diagnosis present

## 2020-11-23 DIAGNOSIS — J9690 Respiratory failure, unspecified, unspecified whether with hypoxia or hypercapnia: Secondary | ICD-10-CM

## 2020-11-23 DIAGNOSIS — R0602 Shortness of breath: Secondary | ICD-10-CM

## 2020-11-23 DIAGNOSIS — J90 Pleural effusion, not elsewhere classified: Secondary | ICD-10-CM

## 2020-11-23 DIAGNOSIS — Z931 Gastrostomy status: Secondary | ICD-10-CM

## 2020-11-23 DIAGNOSIS — Z930 Tracheostomy status: Secondary | ICD-10-CM

## 2020-11-23 DIAGNOSIS — J189 Pneumonia, unspecified organism: Secondary | ICD-10-CM

## 2020-11-23 DIAGNOSIS — Z8616 Personal history of COVID-19: Secondary | ICD-10-CM

## 2020-11-23 LAB — BASIC METABOLIC PANEL
Anion gap: 12 (ref 5–15)
BUN: 24 mg/dL — ABNORMAL HIGH (ref 6–20)
CO2: 32 mmol/L (ref 22–32)
Calcium: 8.5 mg/dL — ABNORMAL LOW (ref 8.9–10.3)
Chloride: 96 mmol/L — ABNORMAL LOW (ref 98–111)
Creatinine, Ser: 0.44 mg/dL — ABNORMAL LOW (ref 0.61–1.24)
GFR, Estimated: >60 mL/min (ref >60)
Glucose, Bld: 148 mg/dL — ABNORMAL HIGH (ref 70–99)
Potassium: 3.5 mmol/L (ref 3.5–5.1)
Sodium: 140 mmol/L (ref 135–145)

## 2020-11-23 LAB — GLUCOSE, CAPILLARY
Glucose-Capillary: 188 mg/dL — ABNORMAL HIGH (ref 70–99)
Glucose-Capillary: 156 mg/dL — ABNORMAL HIGH (ref 70–99)
Glucose-Capillary: 193 mg/dL — ABNORMAL HIGH (ref 70–99)

## 2020-11-23 LAB — PHOSPHORUS: Phosphorus: 2.6 mg/dL (ref 2.5–4.6)

## 2020-11-23 LAB — CBC
HCT: 25.2 % — ABNORMAL LOW (ref 39.0–52.0)
Hemoglobin: 8.2 g/dL — ABNORMAL LOW (ref 13.0–17.0)
MCH: 32.3 pg (ref 26.0–34.0)
MCHC: 32.5 g/dL (ref 30.0–36.0)
MCV: 99.2 fL (ref 80.0–100.0)
Platelets: 168 10*3/uL (ref 150–400)
RBC: 2.54 MIL/uL — ABNORMAL LOW (ref 4.22–5.81)
RDW: 17.5 % — ABNORMAL HIGH (ref 11.5–15.5)
WBC: 11 10*3/uL — ABNORMAL HIGH (ref 4.0–10.5)
nRBC: 0.3 % — ABNORMAL HIGH (ref 0.0–0.2)

## 2020-11-23 LAB — MAGNESIUM: Magnesium: 1.8 mg/dL (ref 1.7–2.4)

## 2020-11-23 MED ORDER — IPRATROPIUM-ALBUTEROL 0.5-2.5 (3) MG/3ML IN SOLN: 3.0000 mL | Freq: Two times a day (BID) | RESPIRATORY_TRACT | Status: DC

## 2020-11-23 MED ORDER — PANTOPRAZOLE SODIUM 40 MG PO PACK: 40.0000 mg | PACK | Freq: Every day | ORAL | Status: AC

## 2020-11-23 MED ORDER — FREE WATER: 200.0000 mL | Freq: Four times a day (QID) | Status: AC

## 2020-11-23 MED ORDER — GABAPENTIN 250 MG/5ML PO SOLN: 250.0000 mg | Freq: Three times a day (TID) | ORAL | 12 refills | Status: AC

## 2020-11-23 MED ORDER — INSULIN ASPART 100 UNIT/ML ~~LOC~~ SOLN
0.0000 [IU] | SUBCUTANEOUS | 11 refills | Status: AC
Start: 2020-11-23 — End: ?

## 2020-11-23 MED ORDER — SODIUM CHLORIDE 0.9 % IV SOLN: 2.0000 g | INTRAVENOUS | Status: AC

## 2020-11-23 MED ORDER — CLONAZEPAM 0.5 MG PO TABS: 0.5000 mg | ORAL_TABLET | Freq: Three times a day (TID) | ORAL | 0 refills | Status: AC

## 2020-11-23 MED ORDER — OXYCODONE HCL 5 MG PO TABS: 5.0000 mg | ORAL_TABLET | Freq: Four times a day (QID) | ORAL | 0 refills | Status: AC

## 2020-11-23 MED ORDER — ORAL CARE MOUTH RINSE: 15.0000 mL | Freq: Two times a day (BID) | OROMUCOSAL | 0 refills | Status: AC

## 2020-11-23 MED ORDER — ENOXAPARIN SODIUM 40 MG/0.4ML ~~LOC~~ SOLN
40.0000 mg | SUBCUTANEOUS | Status: DC
  Administered 2020-11-23: 40 mg via SUBCUTANEOUS
  Filled 2020-11-23: qty 0.4

## 2020-11-23 MED ORDER — INSULIN GLARGINE 100 UNIT/ML ~~LOC~~ SOLN: 15.0000 [IU] | Freq: Every day | SUBCUTANEOUS | 11 refills | Status: AC

## 2020-11-23 MED ORDER — IPRATROPIUM-ALBUTEROL 0.5-2.5 (3) MG/3ML IN SOLN: 3.0000 mL | Freq: Four times a day (QID) | RESPIRATORY_TRACT | Status: AC

## 2020-11-23 MED ORDER — IOHEXOL 180 MG/ML  SOLN
50.0000 mL | Freq: Once | INTRAMUSCULAR | Status: AC | PRN
  Administered 2020-11-23: 50 mL

## 2020-11-23 NOTE — Progress Notes (Signed)
CSW spoke with Victorino Dike at [employer] authorization was approved.  CSW spoke with Dr. Francine Graven to request he complete discharge summary.  Edwin Dada, MSW, LCSW-A Transitions of Care  Clinical Social Worker I [facility] Emergency Departments  Medical ICU [phone]

## 2020-11-23 NOTE — Discharge Summary (Signed)
Physician Discharge Summary  Patient ID: Lawrence Freeman MRN: [id number] DOB/AGE: [birth year] 49 y.o.  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  Post COVID Ventilator / Tracheostomy Dependence   Discharge Plan: -continue rocephin, 7 days total duration of therapy  -follow intermittent CXR  -PCV 22, Rate 18  -Daily SBT, wean assessment protocol  -follow intermittent CXR  -trach care per protocol  -PT efforts for cough / clearance  -continue duoneb Q6 for airway moisture / secretion clearance  -will need humidity on ATC when he is able to tolerate   Stage III Sacral Ulcer   Discharge Plan: -continue daily dressing changes with aquacel dressing and padded foam -pressure relieving measures   Inferolateral ST Elevation  CAD s/p STEMI 2020 with PCI, Stent x2 HTN   Discharge Plan: -hold home lisinopril, BP stable  -consider restart of BP agents pending daily assessment  -continue prasugrel   Anxiety   Discharge Plan: -continue home klonazepam, zoloft, seroquel, gabapentin   DM II   Discharge Plan: -continue SSI  -lantus 15 units QD  -CBG goal 140-180  Severe Protein Calorie Malnutrition  -continue osmolite TF per Nutrition, rate 60 ml/hr  -continue prosource  BPH -hold home flomax, unable to give per tube -restart when able to take PO's                     DISCHARGE SUMMARY   49 y/o M who presented to [facility] on 11/29 with reports of  progressive hypercapnic respiratory failure and ventilator dyssynchrony.  The patient was initially treated at [facility] in [place], [place] for Covid pneumonia and transferred to [facility] in early October 2021.  He had a tracheostomy placed on 9/28 and had since been replaced with a Bivona tracheostomy.  Overnight 11/29, the patient was progressively more hypercapnic with worsening mental status and ventilator dyssynchrony.  He initially was on pressure control ventilation of 28 cm but not adequately getting volumes.  He was transferred to [facility] ICU emergently on 11/29 and underwent bronchoscopy.  Bronchoscopy demonstrated thick tenacious reddish mucoid material that was stubbornly adherent to the inner wall of the Bivona trach.  It also appeared that the trach had migrated down to almost the level of the carina.  Suspected that this material had obstructed or stenosed the inner cannula along with the end of the tracheostomy abutting the carina resulting in inability for the patient to get appropriate tidal volumes.  As much of the adherent material was removed as possible via bronchoscopy.  After such his tidal volumes were much more readily delivered.  Tracheostomy was padded underneath the flange in order to pull the trach back  from the carina.  Those 2 interventions resulted in marked improvement in his tidal volume from 200 mL to 700 mL.  With improvement in tidal volume his pH and hypercarbia improved.  He initially required Levophed for hypotension which was weaned off.  He did not receive sedation as he was obtunded in the setting of hypercarbia. The patient was treated for suspected PNA with cefepime and vancomycin. He was transitioned to ceftriaxone for planned 7 days total therapy. Ventilator settings were weaned to [facility] 22, Rate 18 prior to discharge (MV 16.5, Vt ~500-550, rate 32 breaths/min). The patient was medically cleared for discharge 12/1 to return to [facility] for rehab  efforts.             SIGNIFICANT DIAGNOSTIC STUDIES Bronchoscopy 11/29 >> thick reddish mucoid material adhered to bivona trach with migration of trach down to level of carina. Attempted to remove material  MICRO DATA  Respiratory Culture 11/29 >>  BCx2 11/29 >>   ANTIBIOTICS Vancomycin 11/29 >> 11/30 Cefepime 11/29 >> 11/30 Ceftriaxone 11/30 >>   TUBES / LINES Trach (placed pre-[facility] admit) >>  PEG (placed pre-[facility] admit) >>    Discharge Exam: General: adult male lying in bed in NAD watching TV HEENT: MM pink/moist, #7 portex midline c/d/i, anicteric  Neuro: AAOx4, communicates appropriately, generalized weakness, MAE CV: s1s2 rrr, no m/r/g PULM: non-labored on vent, breathes ~ 32/min, lungs bilaterally clear GI: soft, bsx4 active, PEG in place, tolerating TF  Extremities: warm/dry, no edema  Skin: no rashes or lesions   Vitals:   11/23/20 1112 11/23/20 1150 11/23/20 1200 11/23/20 1300  BP:   (!) 133/93 136/90  Pulse:   (!) 106 (!) 104  Resp:   (!) 32 (!) 33  Temp: 98 F (36.7 C)     TempSrc: Oral     SpO2:  100% 94% 95%  Weight:         Discharge Labs  BMET Recent Labs  Lab 11/17/20 0340 11/18/20 0414 11/21/20 0027 11/21/20 0027 11/21/20 0616 11/21/20 1046 11/21/20 1046 11/21/20 1714 11/22/20 0630 11/22/20 0823 11/22/20 1857 11/23/20 0502  NA 136  --  137  --  137 140  --   --   --  139  --  140  K 5.0   < > 5.7*   < > 5.3* 3.9   < >  --   --  3.3*  --  3.5  CL 92*  --  88*  --   --  93*  --   --   --  96*  --  96*  CO2 35*  --  36*  --   --  36*  --   --   --  31  --  32  GLUCOSE 176*  --  254*  --   --  146*  --   --   --  265*  --  148*  BUN 45*  --  51*  --   --  51*  --   --   --  36*  --  24*  CREATININE 0.48*  --  0.59*  --   --  0.59*  --   --   --  0.54*  --  0.44*  CALCIUM 9.2  --  9.7  --   --  9.2  --   --   --  8.3*  --  8.5*  MG 2.1  --  2.0   < >  --  1.8  --  1.8 1.8  --  1.8 1.8  PHOS  --   --   --   --   --  3.1  --  3.2 2.7  --   3.2 2.6   < > = values in this interval not displayed.    CBC Recent Labs  Lab 11/21/20 1046 11/22/20 0630 11/23/20 0502  HGB 8.9* 8.2* 8.2*  HCT 26.6* 25.5* 25.2*  WBC 21.1* 11.3* 11.0*  PLT 237 198 168    Anti-Coagulation No results for input(s): INR in the last 168 hours.  Discharge Instructions    Call MD for:  difficulty breathing, headache or visual disturbances   Complete by: As directed    Call MD for:  extreme fatigue   Complete by: As directed    Call MD for:  hives   Complete by: As directed    Call MD for:  persistant dizziness or light-headedness   Complete by: As directed    Call MD for:  persistant nausea and vomiting   Complete by: As directed    Call MD for:  redness, tenderness, or signs of infection (pain, swelling, redness, odor or green/yellow discharge around incision site)   Complete by: As directed    Call MD for:  severe uncontrolled pain   Complete by: As directed    Call MD for:  temperature >100.4   Complete by: As directed    Discharge instructions   Complete by: As directed    1. Discharge to [facility] when bed available  2. Keep all lines / tubes in place for transfer  3. Review medications carefully as they have changed.  4. Trach care per protocol  5. Return to ER if new or worsening symptoms   Discharge wound care:   Complete by: As directed    Place small piece of Aquacel Advantage in coccyx wound.  Secure with sacral foam dressing.  Change daily or PRN soiling.   Increase activity slowly   Complete by: As directed          Allergies as of 11/23/2020      Reactions   Penicillins Anaphylaxis   Tolerated cefepime and ceftriaxone in 10/2020   Precedex [dexmedetomidine Hcl In Nacl] Other (See Comments)   Severe bradycardia      Medication List    STOP taking these medications   ciprofloxacin 500 MG tablet Commonly known as: CIPRO   clonazePAM 0.5 MG disintegrating tablet Commonly known as:  KLONOPIN Replaced by: clonazePAM 0.5 MG tablet   fentaNYL Soln Commonly known as: SUBLIMAZE   hydrALAZINE 20 MG/ML injection Commonly known as: APRESOLINE   lisinopril 5 MG tablet Commonly known as: ZESTRIL   melatonin 3 MG Tabs tablet   metoprolol tartrate 5 MG/5ML Soln injection Commonly known as: LOPRESSOR   pantoprazole 40 MG injection Commonly known as: PROTONIX Replaced by: pantoprazole sodium 40 mg/20 mL Pack   tamsulosin 0.4 MG Caps capsule Commonly known as: FLOMAX     TAKE these medications   acetaminophen 160 MG/5ML solution Commonly known as: TYLENOL Place 20.3 mLs (650 mg total) into feeding tube every 6 (six) hours as needed for mild pain.   allopurinol 300 MG tablet Commonly known as: ZYLOPRIM Place 1 tablet (300 mg total) into feeding tube daily.   amantadine 50 MG/5ML solution Commonly known as: SYMMETREL Place 10 mLs (100 mg total) into feeding tube daily.   BETA CAROTENE PROVITAMIN A PO Give 1 tablet by tube daily.  cefTRIAXone 2 g in sodium chloride 0.9 % 100 mL Inject 2 g into the vein daily for 4 days.   chlorhexidine gluconate (MEDLINE KIT) 0.12 % solution Commonly known as: PERIDEX 15 mLs by Mouth Rinse route 2 (two) times daily.   Chlorhexidine Gluconate Cloth 2 % Pads Apply 6 each topically daily.   clonazePAM 0.5 MG tablet Commonly known as: KLONOPIN Place 1 tablet (0.5 mg total) into feeding tube 3 (three) times daily. Replaces: clonazePAM 0.5 MG disintegrating tablet   docusate 50 MG/5ML liquid Commonly known as: COLACE Place 10 mLs (100 mg total) into feeding tube 2 (two) times daily as needed for mild constipation.   feeding supplement (PROSource TF) liquid Place 45 mLs into feeding tube 3 (three) times daily.   feeding supplement (OSMOLITE 1.5 CAL) Liqd Place 1,000 mLs into feeding tube continuous.   nutrition supplement (JUVEN) Pack Place 1 packet into feeding tube 2 (two) times daily between meals.   fiber Pack  packet Place 1 packet into feeding tube 2 (two) times daily.   folic acid 1 MG tablet Commonly known as: FOLVITE Place 1 tablet (1 mg total) into feeding tube daily.   free water Soln Place 200 mLs into feeding tube every 6 (six) hours.   gabapentin 250 MG/5ML solution Commonly known as: NEURONTIN Place 5 mLs (250 mg total) into feeding tube every 8 (eight) hours. What changed: how much to take   guaiFENesin 200 MG tablet Place 400 mg into feeding tube in the morning, at noon, and at bedtime.   heparin 5000 UNIT/ML injection Inject 1 mL (5,000 Units total) into the skin every 8 (eight) hours.   insulin aspart 100 UNIT/ML injection Commonly known as: novoLOG Inject 0-15 Units into the skin every 4 (four) hours. What changed:   how much to take  when to take this   insulin glargine 100 UNIT/ML injection Commonly known as: LANTUS Inject 0.15 mLs (15 Units total) into the skin daily. Start taking on: November 24, 2020 What changed:   how much to take  when to take this   ipratropium-albuterol 0.5-2.5 (3) MG/3ML Soln Commonly known as: DUONEB Take 3 mLs by nebulization every 6 (six) hours. What changed:   when to take this  reasons to take this   lidocaine 5 % Commonly known as: LIDODERM Place 1 patch onto the skin daily. Remove & Discard patch within 12 hours or as directed by MD   mouth rinse Liqd solution 15 mLs by Mouth Rinse route 2 (two) times daily.   ondansetron 4 MG/2ML Soln injection Commonly known as: ZOFRAN Inject 2 mLs (4 mg total) into the vein every 6 (six) hours as needed for nausea.   oxyCODONE 5 MG immediate release tablet Commonly known as: Oxy IR/ROXICODONE Place 1 tablet (5 mg total) into feeding tube every 6 (six) hours. What changed: when to take this   pantoprazole sodium 40 mg/20 mL Pack Commonly known as: PROTONIX Place 20 mLs (40 mg total) into feeding tube daily. Start taking on: November 24, 2020 Replaces: pantoprazole 40 MG  injection   polyethylene glycol 17 g packet Commonly known as: MIRALAX / GLYCOLAX Place 17 g into feeding tube daily as needed for moderate constipation.   prasugrel 10 MG Tabs tablet Commonly known as: EFFIENT Place 1 tablet (10 mg total) into feeding tube daily.   QUEtiapine 50 MG tablet Commonly known as: SEROQUEL Place 1 tablet (50 mg total) into feeding tube at bedtime.   sertraline 50 MG  tablet Commonly known as: ZOLOFT Place 1 tablet (50 mg total) into feeding tube daily.   sodium chloride flush 0.9 % Soln Commonly known as: NS 10-40 mLs by Intracatheter route every 12 (twelve) hours.   sodium chloride flush 0.9 % Soln Commonly known as: NS 10-40 mLs by Intracatheter route as needed (flush).   Androderm 2 MG/24HR Pt24 Generic drug: Testosterone Place 2 mg onto the skin daily.   testosterone 50 MG/5GM (1%) Gel Commonly known as: ANDROGEL Place 5 g onto the skin daily.   thiamine 100 MG tablet Place 1 tablet (100 mg total) into feeding tube daily.            Discharge Care Instructions  (From admission, onward)         Start     Ordered   11/23/20 0000  Discharge wound care:       Comments: Place small piece of Aquacel Advantage in coccyx wound.  Secure with sacral foam dressing.  Change daily or PRN soiling.   11/23/20 1337            Disposition:  [facility]  Discharged Condition: Lawrence Freeman has met maximum benefit of inpatient care and is medically stable and cleared for discharge to [facility].  Patient is pending follow up as above.      Time spent on disposition:  Greater than 35 Minutes.   Signed: Noe Gens, MSN, NP-C, AGACNP-BC [facility] 11/23/2020, 2:25 PM   Please see [email or web address] for pager details.

## 2020-11-23 NOTE — Progress Notes (Signed)
Patient noted to have 7.0 Bivona Aire trach. Flowsheet updated to reflect this. Janina Mayo was inserted prior to arrival to this unit.

## 2020-11-23 NOTE — Progress Notes (Signed)
NAME:  Lawrence Freeman, MRN:  [id number], DOB:  April 14, 1971, LOS: 2 ADMISSION DATE:  11/21/2020, CONSULTATION DATE: 11/21/2020 [facility]FERRING MD: [facility], CHIEF COMPLAINT: Hypercapnic respiratory failure  Brief History   49 year old recently diagnosed with Covid pneumonia along stay and brought hospital in [facility], I was asked to see due to worsening hypercapnic respiratory failure.  History of present illness   49 year old male treated at [facility] and [facility] for [facility]id pneumonia and [facility] about 3 to 4 weeks ago.  He was trached reportedly profoundly weak.  Tracheostomy was done on 09/20/2020 and has since been replaced with a Bivona trach.  His trach does not have an inner cannula for cleaning.  Apparently through the night the patient became progressively hypercapnic with worsening in his mental status and dyssynchronous breathing with the ventilator.  Tidal volumes on a set pressure control of 28 cm of water were about 20 on [facility] at about 530 this morning.  He was transferred to [facility], ICU emergently underwent bronchoscopy.  Bronchoscopy shows thick tenacious reddish mucoid material that is stubbornly adherent to the inner wall of the Bivona trach.  It also appears that the trach had migrated down almost to the level of the carina.  Believe that this material had obstructed or stenosed the inner cannula along with the end of the trach abutting the carina resulted in an inability for the patient to get appropriate tidal volumes.  Once this material was removed to the best of my ability, although there was a significant amount still adherent to the wall of the trach tube, tidal volumes were much more easily delivered.  We also padded the underneath of the trach tube as it left the stoma in order to pull it back from the carina.  These 2 changes resulted in marked  improvement in his tidal volumes from 200 up to almost 700.  With improvement presumably in his pH is blood pressure came up to the 130s over 80 and we will get a start reducing his Levophed.  Patient did not receive sedation for this due to obtundation. Will obtain chest x-ray CMP ABG CBC.  Patient stable at time of this dictation significantly improved post bronchoscopy  Past Medical History  Hypertension Hyperlipidemia Covid positive pneumonia Tracheostomy  Significant Hospital Events   Transferred to [facility], ICU  Consults:  PCCM  Procedures:  Bronchoscopy 11/29: thick reddish mucoid material adhered to bivona trach with migration of trach down to level of carina. Attempted to remove material and trach was   Significant Diagnostic Tests:  Chest x-ray performed on the floor reading was consistent with previous Covid pneumonia  Micro Data:  Respiratory culture 11/29 >>  Blood culture 11/29 >>  Antimicrobials:  Vancomycin 11/29 >> Cefepime 11/29 >>   Interim history/subjective:   No acute issues overnight. Insurance has approved rehab admission.  Objective   Blood pressure 136/89, pulse (!) 106, temperature 98 F (36.7 C), temperature source Oral, resp. rate (!) 31, weight 69.8 kg, SpO2 100 %.    Vent Mode: PCV FiO2 (%):  [40 %] 40 % Set Rate:  [30 bmp] 30 bmp Vt Set:  [499 mL] 499 mL PEEP:  [5 cmH20] 5 cmH20 Pressure Support:  [10 cmH20] 10 cmH20 Plateau Pressure:  [16 cmH20-27 cmH20] 27 cmH20   Intake/Output Summary (Last 24 hours) at 11/23/2020 1220 Last data filed at 11/23/2020 0800 Gross per  24 hour  Intake 806.89 ml  Output 1850 ml  Net -1043.11 ml   Filed Weights   11/21/20 0811 11/22/20 0500 11/23/20 0500  Weight: 68.4 kg 69.7 kg 69.8 kg    Examination: General: Thin male, NAD  HENT: sclera anicteric, trach in place Lungs: course breath sounds on left Cardiovascular: Regular rate and rhythm, no m/r/g  Abdomen: soft, NTTP, normal BS   Extremities: Thin, no edema, moving all extremities  Neuro: alert, following commands Skin: stage III sacral ulcer, no purulent drainage or surrounding erythema  Resolved Hospital Problem list   Sepsis Hyperkalemia   Assessment & Plan:   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   - cont. Duonebs - PT/OT  - stable for [facility], insurance authorization approved  Sepsis  Resolved. Possible secondary to pneumonia. Off pressors.  - cont. 7 days of abx total, switch to ceftriaxone   - cultures NGTD  Sacral Ulcer Stage III - wound care consulted  Inferolateral ST wave elevations CAD s/p STEMI 2020 with PCI stentx2  HTN  Asymptomic. Last ECG available to compare from early November. Echo at this time within normal limits. Trops yesterday flat. Repeat ECG with sinus tachycardia only.  - cont. prasugrel 10 mg - start losartan 5mg , home dose 10mg    Anxiety - cont. Home clonazepam and seroquel 50 mg qhs.   TIIDM - start lantus 15U qd  - cont. SSI moderate  - add qhs SSI   Best practice (evaluated daily)   Diet: tube feeds,  Pain/Anxiety/Delirium protocol (if indicated): N/A VAP protocol (if indicated): Continue current ventilatory modes discussed with respiratory DVT prophylaxis: Lovenox GI prophylaxis: protonix Glucose control: Monitor Mobility: Bedrest Family communication: patient updated today Code Status: Full Disposition: [facility]  Labs   CBC: Recent Labs  Lab 11/17/20 0340 11/17/20 0340 11/21/20 0027 11/21/20 0616 11/21/20 1046 11/22/20 0630 11/23/20 0502  WBC 16.1*  --  27.0*  --  21.1* 11.3* 11.0*  NEUTROABS  --   --   --   --  18.3*  --   --   HGB 10.1*   < > 10.6* 9.9* 8.9* 8.2* 8.2*  HCT 30.0*   < > 31.8* 29.0* 26.6* 25.5* 25.2*  MCV 97.4   --  95.5  --  95.7 101.2* 99.2  PLT 427*  --  259  --  237 198 168   < > = values in this interval not displayed.    Basic Metabolic Panel: Recent Labs  Lab 11/17/20 0340 11/18/20 0414 11/21/20 0027 11/21/20 0027 11/21/20 0616 11/21/20 1046 11/21/20 1714 11/22/20 0630 11/22/20 0823 11/22/20 1857 11/23/20 0502  NA 136  --  137  --  137 140  --   --  139  --  140  K 5.0   < > 5.7*  --  5.3* 3.9  --   --  3.3*  --  3.5  CL 92*  --  88*  --   --  93*  --   --  96*  --  96*  CO2 35*  --  36*  --   --  36*  --   --  31  --  32  GLUCOSE 176*  --  254*  --   --  146*  --   --  265*  --  148*  BUN 45*  --  51*  --   --  51*  --   --  36*  --  24*  CREATININE 0.48*  --  0.59*  --   --  0.59*  --   --  0.54*  --  0.44*  CALCIUM 9.2  --  9.7  --   --  9.2  --   --  8.3*  --  8.5*  MG 2.1  --  2.0   < >  --  1.8 1.8 1.8  --  1.8 1.8  PHOS  --   --   --   --   --  3.1 3.2 2.7  --  3.2 2.6   < > = values in this interval not displayed.   GFR: Estimated Creatinine Clearance: 109.1 mL/min (A) (by C-G formula based on SCr of 0.44 mg/dL (L)). Recent Labs  Lab 11/21/20 0027 11/21/20 0336 11/21/20 1046 11/22/20 0630 11/23/20 0502  PROCALCITON  --   --  0.26  --   --   WBC 27.0*  --  21.1* 11.3* 11.0*  LATICACIDVEN  --  2.3*  --   --   --     Liver Function Tests: Recent Labs  Lab 11/21/20 1046  AST 20  ALT 29  ALKPHOS 73  BILITOT 0.5  PROT 5.1*  ALBUMIN 2.9*   No results for input(s): LIPASE, AMYLASE in the last 168 hours. No results for input(s): AMMONIA in the last 168 hours.  ABG    Component Value Date/Time   PHART 7.361 11/21/2020 0616   PCO2ART 78.7 (HH) 11/21/2020 0616   PO2ART 310 (H) 11/21/2020 0616   HCO3 44.5 (H) 11/21/2020 0616   TCO2 47 (H) 11/21/2020 0616   O2SAT 100.0 11/21/2020 0616     Coagulation Profile: No results for input(s): INR, PROTIME in the last 168 hours.  Cardiac Enzymes: No results for input(s): CKTOTAL, CKMB, CKMBINDEX, TROPONINI in  the last 168 hours.  HbA1C: Hgb A1c MFr Bld  Date/Time Value Ref Range Status  10/12/2020 05:54 AM 6.5 (H) 4.8 - 5.6 % Final    Comment:    (NOTE)         Prediabetes: 5.7 - 6.4         Diabetes: >6.4         Glycemic control for adults with diabetes: <7.0     CBG: Recent Labs  Lab 11/22/20 1945 11/22/20 2334 11/23/20 0326 11/23/20 0742 11/23/20 1117  GLUCAP 122* 210* 188* 156* 193*     Melody Comas, MD [facility] Office: [phone]   See Amion for Pager Details

## 2020-11-24 DIAGNOSIS — Z930 Tracheostomy status: Secondary | ICD-10-CM | POA: Diagnosis not present

## 2020-11-24 DIAGNOSIS — J9621 Acute and chronic respiratory failure with hypoxia: Secondary | ICD-10-CM | POA: Diagnosis not present

## 2020-11-24 DIAGNOSIS — A419 Sepsis, unspecified organism: Secondary | ICD-10-CM

## 2020-11-24 DIAGNOSIS — U071 COVID-19: Secondary | ICD-10-CM

## 2020-11-24 DIAGNOSIS — R6520 Severe sepsis without septic shock: Secondary | ICD-10-CM

## 2020-11-24 LAB — CULTURE, RESPIRATORY W GRAM STAIN

## 2020-11-24 NOTE — Consult Note (Signed)
Pulmonary Critical Care Medicine [facility] [place]  PULMONARY SERVICE  Date of Service: 11/24/2020  PULMONARY CRITICAL CARE CONSULT   Lawrence Freeman [facility]  BLT:[id number]  DOB: [birth year]   DOA: 11/23/2020  Referring Physician: Carron Curie, MD  HPI: Lawrence Freeman is a 49 y.o. male seen for follow up of Acute on Chronic Respiratory Failure. Patient has multiple medical problems including coronary artery disease COVID-19 code pneumonia hyperlipidemia hypertension who had been sent over to the intensive care unit with sepsis drop in blood pressure. Patient was on treated also had some issues with his tracheostomy which was changed downstairs now is back for further management. Patient was treated with IV Rocephin given total of 7 days duration. Appears to be doing little bit better since the tracheostomy was changed his airway pressures are better and he is oxygenating better.  Review of Systems:  ROS performed and is unremarkable other than noted above.  Past Medical History:  Diagnosis Date  . Acute infective tracheobronchitis   . Acute on chronic respiratory failure with hypoxia (HCC)   . Anxiety   . CAD (coronary artery disease)   . COVID-19 virus infection   . Gout   . Hyperlipidemia   . Hypertension   . Pneumonia due to COVID-19 virus   . 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 IR GASTROSTOMY TUBE MOD SED  10/19/2020    Social History:    reports that he has never smoked. He has never used smokeless tobacco. He reports previous alcohol use. He reports that he does not use drugs.  Family History: Non-Contributory to the present illness  Allergies  Allergen Reactions  . Penicillins Anaphylaxis    Tolerated cefepime and ceftriaxone in 10/2020  . Precedex [Dexmedetomidine Hcl In Nacl] Other (See Comments)    Severe bradycardia    Medications: Reviewed on Rounds  Physical Exam:  Vitals:  Temperature is 97.4 pulse 91 respiratory 21 blood pressure is 125/76 saturations 100%  Ventilator Settings on pressure assist control FiO2 40% IP 22 PEEP 5  . General: Comfortable at this time . Eyes: Grossly normal lids, irises & conjunctiva . ENT: grossly tongue is normal . Neck: no obvious mass . Cardiovascular: S1-S2 normal no gallop or rub . Respiratory: No rhonchi very coarse breath sounds . Abdomen: Soft and nontender . Skin: no rash seen on limited exam . Musculoskeletal: not rigid . Psychiatric:unable to assess . Neurologic: no seizure no involuntary movements         Labs on Admission:  Basic Metabolic Panel: Recent Labs  Lab 11/21/20 0027 11/21/20 0027 11/21/20 0616 11/21/20 1046 11/21/20 1714 11/22/20 0630 11/22/20 0823 11/22/20 1857 11/23/20 0502  NA 137  --  137 140  --   --  139  --  140  K 5.7*  --  5.3* 3.9  --   --  3.3*  --  3.5  CL 88*  --   --  93*  --   --  96*  --  96*  CO2 36*  --   --  36*  --   --  31  --  32  GLUCOSE 254*  --   --  146*  --   --  265*  --  148*  BUN 51*  --   --  51*  --   --  36*  --  24*  CREATININE 0.59*  --   --  0.59*  --   --  0.54*  --  0.44*  CALCIUM 9.7  --   --  9.2  --   --  8.3*  --  8.5*  MG 2.0   < >  --  1.8 1.8 1.8  --  1.8 1.8  PHOS  --   --   --  3.1 3.2 2.7  --  3.2 2.6   < > = values in this interval not displayed.    Recent Labs  Lab 11/20/20 2240 11/21/20 0100 11/21/20 0330 11/21/20 0616  PHART 7.207* 7.265* 7.126* 7.361  PCO2ART 97.6* 86.8* >120.0* 78.7*  PO2ART 90.3 80.7* 180* 310*  HCO3 38.6* 39.3* 40.2* 44.5*  O2SAT 97.4 96.9 99.3 100.0    Liver Function Tests: Recent Labs  Lab 11/21/20 1046  AST 20  ALT 29  ALKPHOS 73  BILITOT 0.5  PROT 5.1*  ALBUMIN 2.9*   No results for input(s): LIPASE, AMYLASE in the last 168 hours. No results for input(s): AMMONIA in the last 168 hours.  CBC: Recent Labs  Lab 11/21/20 0027 11/21/20 0616 11/21/20 1046 11/22/20 0630 11/23/20 0502   WBC 27.0*  --  21.1* 11.3* 11.0*  NEUTROABS  --   --  18.3*  --   --   HGB 10.6* 9.9* 8.9* 8.2* 8.2*  HCT 31.8* 29.0* 26.6* 25.5* 25.2*  MCV 95.5  --  95.7 101.2* 99.2  PLT 259  --  237 198 168    Cardiac Enzymes: No results for input(s): CKTOTAL, CKMB, CKMBINDEX, TROPONINI in the last 168 hours.  BNP (last 3 results) No results for input(s): BNP in the last 8760 hours.  ProBNP (last 3 results) No results for input(s): PROBNP in the last 8760 hours.   Radiological Exams on Admission: DG Chest 1 View  Result Date: 11/21/2020 CLINICAL DATA:  Respiratory failure EXAM: CHEST  1 VIEW COMPARISON:  11/16/2020 FINDINGS: Cardiac shadow is stable. Tracheostomy tube is again noted and stable. Patchy airspace opacities are noted and increased from the prior exam consistent with the given clinical history of COVID-19 pneumonia. No bony abnormality is seen. IMPRESSION: Bilateral airspace opacities consistent with multifocal viral pneumonia. Electronically Signed   By: Alcide Clever M.D.   On: 11/21/2020 02:25   DG ABDOMEN PEG TUBE LOCATION  Result Date: 11/23/2020 CLINICAL DATA:  49 year old male with PEG placement. EXAM: ABDOMEN - 1 VIEW COMPARISON:  Abdominal radiograph dated 11/17/2020. FINDINGS: Percutaneous gastrostomy with balloon over the body of the stomach. Contrast injected via the tube opacifies the stomach. No extraluminal contrast noted to suggest a leak. No bowel dilatation or evidence of obstruction. The osseous structures are intact. Bibasilar densities, may represent atelectasis or infiltrate. IMPRESSION: Percutaneous gastrostomy with balloon overlying the body of the stomach. No evidence of a leak. Electronically Signed   By: Elgie Collard M.D.   On: 11/23/2020 23:20   DG CHEST PORT 1 VIEW  Result Date: 11/23/2020 CLINICAL DATA:  Respiratory failure EXAM: PORTABLE CHEST 1 VIEW COMPARISON:  Chest radiograph November 21, 2020 FINDINGS: Tracheostomy tube is unchanged. Heart size  and mediastinal contours are within normal limits. Similar low lung volumes. Increased right greater than left ground-glass pulmonary infiltrates. No visible pneumothorax or pleural effusions. The visualized skeletal structures are unremarkable. IMPRESSION: Worsened right greater than left pulmonary infiltrates. Electronically Signed   By: Maudry Mayhew MD   On: 11/23/2020 10:33   DG Chest Port 1 View  Result Date: 11/21/2020 CLINICAL DATA:  Respiratory failure EXAM: PORTABLE CHEST 1 VIEW COMPARISON:  11/21/2020 FINDINGS:  Tracheostomy is unchanged. Lung volumes are small, but are symmetric and pulmonary insufflation is stable since prior examination. Superimposed bilateral ground-glass pulmonary infiltrates, more prevalent within the left lung, appear improved since prior examination. No pneumothorax or pleural effusion. Cardiac size within normal limits. IMPRESSION: Low lung volumes.  Stable pulmonary insufflation. Improving diffuse ground-glass pulmonary infiltrate.  None Electronically Signed   By: Helyn Numbers MD   On: 11/21/2020 06:31    Assessment/Plan Active Problems:   Acute on chronic respiratory failure with hypoxia (HCC)   COVID-19 virus infection   Tracheostomy status (HCC)   Severe sepsis (HCC)   1. Acute on chronic respiratory failure hypoxia patient currently is on full support on the ventilator.  His mechanics are little bit better FiO2 requirements are down we will continue with the full support respiratory therapy will check the RSB I mechanics to see if patient can go into pressure support 2. Severe sepsis now resolved plan is to continue with the completion of antibiotic course. We will continue to monitor closely otherwise. 3. Tracheostomy status that after the trach change patient's doing better with ventilation. We will continue with attempting weaning now. 4. COVID-19 virus infection in recovery we will continue to follow along closely.  I have personally seen and  evaluated the patient, evaluated laboratory and imaging results, formulated the assessment and plan and placed orders. The Patient requires high complexity decision making with multiple systems involvement.  Case was discussed on Rounds with the Respiratory Therapy Director and the Respiratory staff Time Spent  Yevonne Pax, MD [facility] Pulmonary Critical Care Medicine Sleep Medicine

## 2020-11-25 DIAGNOSIS — A419 Sepsis, unspecified organism: Secondary | ICD-10-CM

## 2020-11-25 DIAGNOSIS — U071 COVID-19: Secondary | ICD-10-CM | POA: Diagnosis not present

## 2020-11-25 DIAGNOSIS — J9621 Acute and chronic respiratory failure with hypoxia: Secondary | ICD-10-CM | POA: Diagnosis not present

## 2020-11-25 DIAGNOSIS — Z930 Tracheostomy status: Secondary | ICD-10-CM | POA: Diagnosis not present

## 2020-11-25 NOTE — Progress Notes (Signed)
Pulmonary Critical Care Medicine [facility] [place]   PULMONARY CRITICAL CARE SERVICE  PROGRESS NOTE  Date of Service: 11/25/2020  Lawrence Freeman  BPZ:[id number]  DOB: 09-11-1971   DOA: 11/23/2020  Referring Physician: Carron Curie, MD  HPI: Lawrence Freeman is a 49 y.o. male seen for follow up of Acute on Chronic Respiratory Failure. Continues to do well with ventilation. Patient is now on pressure control mode spoke with respiratory therapy to try to switch him over to assist control mode and try with a pressure support wean if possible  Medications: Reviewed on Rounds  Physical Exam:  Vitals: Temperature is 96.4 pulse 88 respiratory 24 blood pressure is 133/87 saturations 100%  Ventilator Settings on pressure control FiO2 is 40% PEEP 5 tidal volume 540 2P 22  . General: Comfortable at this time . Eyes: Grossly normal lids, irises & conjunctiva . ENT: grossly tongue is normal . Neck: no obvious mass . Cardiovascular: S1 S2 normal no gallop . Respiratory: No rhonchi very coarse breath sounds . Abdomen: soft . Skin: no rash seen on limited exam . Musculoskeletal: not rigid . Psychiatric:unable to assess . Neurologic: no seizure no involuntary movements         Lab Data:   Basic Metabolic Panel: Recent Labs  Lab 11/21/20 0027 11/21/20 0027 11/21/20 0616 11/21/20 1046 11/21/20 1714 11/22/20 0630 11/22/20 0823 11/22/20 1857 11/23/20 0502  NA 137  --  137 140  --   --  139  --  140  K 5.7*  --  5.3* 3.9  --   --  3.3*  --  3.5  CL 88*  --   --  93*  --   --  96*  --  96*  CO2 36*  --   --  36*  --   --  31  --  32  GLUCOSE 254*  --   --  146*  --   --  265*  --  148*  BUN 51*  --   --  51*  --   --  36*  --  24*  CREATININE 0.59*  --   --  0.59*  --   --  0.54*  --  0.44*  CALCIUM 9.7  --   --  9.2  --   --  8.3*  --  8.5*  MG 2.0   < >  --  1.8 1.8 1.8  --  1.8 1.8  PHOS  --   --   --  3.1 3.2 2.7  --  3.2 2.6   < > = values in this  interval not displayed.    ABG: Recent Labs  Lab 11/20/20 2240 11/21/20 0100 11/21/20 0330 11/21/20 0616  PHART 7.207* 7.265* 7.126* 7.361  PCO2ART 97.6* 86.8* >120.0* 78.7*  PO2ART 90.3 80.7* 180* 310*  HCO3 38.6* 39.3* 40.2* 44.5*  O2SAT 97.4 96.9 99.3 100.0    Liver Function Tests: Recent Labs  Lab 11/21/20 1046  AST 20  ALT 29  ALKPHOS 73  BILITOT 0.5  PROT 5.1*  ALBUMIN 2.9*   No results for input(s): LIPASE, AMYLASE in the last 168 hours. No results for input(s): AMMONIA in the last 168 hours.  CBC: Recent Labs  Lab 11/21/20 0027 11/21/20 0616 11/21/20 1046 11/22/20 0630 11/23/20 0502  WBC 27.0*  --  21.1* 11.3* 11.0*  NEUTROABS  --   --  18.3*  --   --   HGB 10.6* 9.9* 8.9* 8.2* 8.2*  HCT 31.8* 29.0* 26.6* 25.5* 25.2*  MCV 95.5  --  95.7 101.2* 99.2  PLT 259  --  237 198 168    Cardiac Enzymes: No results for input(s): CKTOTAL, CKMB, CKMBINDEX, TROPONINI in the last 168 hours.  BNP (last 3 results) No results for input(s): BNP in the last 8760 hours.  ProBNP (last 3 results) No results for input(s): PROBNP in the last 8760 hours.  Radiological Exams: DG ABDOMEN PEG TUBE LOCATION  Result Date: 11/23/2020 CLINICAL DATA:  48 year old male with PEG placement. EXAM: ABDOMEN - 1 VIEW COMPARISON:  Abdominal radiograph dated 11/17/2020. FINDINGS: Percutaneous gastrostomy with balloon over the body of the stomach. Contrast injected via the tube opacifies the stomach. No extraluminal contrast noted to suggest a leak. No bowel dilatation or evidence of obstruction. The osseous structures are intact. Bibasilar densities, may represent atelectasis or infiltrate. IMPRESSION: Percutaneous gastrostomy with balloon overlying the body of the stomach. No evidence of a leak. Electronically Signed   By: Elgie Collard M.D.   On: 11/23/2020 23:20   DG CHEST PORT 1 VIEW  Result Date: 11/23/2020 CLINICAL DATA:  Respiratory failure EXAM: PORTABLE CHEST 1 VIEW  COMPARISON:  Chest radiograph November 21, 2020 FINDINGS: Tracheostomy tube is unchanged. Heart size and mediastinal contours are within normal limits. Similar low lung volumes. Increased right greater than left ground-glass pulmonary infiltrates. No visible pneumothorax or pleural effusions. The visualized skeletal structures are unremarkable. IMPRESSION: Worsened right greater than left pulmonary infiltrates. Electronically Signed   By: Maudry Mayhew MD   On: 11/23/2020 10:33    Assessment/Plan Active Problems:   Acute on chronic respiratory failure with hypoxia (HCC)   COVID-19 virus infection   Tracheostomy status (HCC)   Severe sepsis (HCC)   1. Acute on chronic respiratory failure with hypoxia we will try to begin the weaning protocol discussed on rounds with respiratory therapy. 2. COVID-19 virus infection in recovery we will continue supportive care 3. Severe sepsis treated with antibiotics hemodynamics are stable now 4. Tracheostomy after change doing better we will continue to monitor   I have personally seen and evaluated the patient, evaluated laboratory and imaging results, formulated the assessment and plan and placed orders. The Patient requires high complexity decision making with multiple systems involvement.  Rounds were done with the Respiratory Therapy Director and Staff therapists and discussed with nursing staff also.  Yevonne Pax, MD [facility] Pulmonary Critical Care Medicine Sleep Medicine

## 2020-11-26 DIAGNOSIS — J9621 Acute and chronic respiratory failure with hypoxia: Secondary | ICD-10-CM | POA: Diagnosis not present

## 2020-11-26 DIAGNOSIS — A419 Sepsis, unspecified organism: Secondary | ICD-10-CM | POA: Diagnosis not present

## 2020-11-26 DIAGNOSIS — U071 COVID-19: Secondary | ICD-10-CM | POA: Diagnosis not present

## 2020-11-26 DIAGNOSIS — Z930 Tracheostomy status: Secondary | ICD-10-CM | POA: Diagnosis not present

## 2020-11-26 LAB — CULTURE, BLOOD (ROUTINE X 2)
Culture: NO GROWTH
Culture: NO GROWTH
Special Requests: ADEQUATE
Special Requests: ADEQUATE

## 2020-11-26 NOTE — Progress Notes (Signed)
Pulmonary Critical Care Medicine [facility] [place]   PULMONARY CRITICAL CARE SERVICE  PROGRESS NOTE  Date of Service: 11/26/2020  Lawrence Freeman  UTM:[id number]  DOB: 02/23/1971   DOA: 11/23/2020  Referring Physician: Carron Curie, MD  HPI: Lawrence Freeman is a 49 y.o. male seen for follow up of Acute on Chronic Respiratory Failure.  Patient is on full support currently on pressure control mode has been on 40% FiO2 currently on an IP of 22 with a PEEP of 5  Medications: Reviewed on Rounds  Physical Exam:  Vitals: Temperature is 96.6 pulse 95 respiratory 22 blood pressure is 114/81 saturations 98%  Ventilator Settings on pressure control FiO2 40% IP 22 PEEP 5  . General: Comfortable at this time . Eyes: Grossly normal lids, irises & conjunctiva . ENT: grossly tongue is normal . Neck: no obvious mass . Cardiovascular: S1 S2 normal no gallop . Respiratory: Scattered rhonchi very coarse breath sounds . Abdomen: soft . Skin: no rash seen on limited exam . Musculoskeletal: not rigid . Psychiatric:unable to assess . Neurologic: no seizure no involuntary movements         Lab Data:   Basic Metabolic Panel: Recent Labs  Lab 11/21/20 0027 11/21/20 0027 11/21/20 0616 11/21/20 1046 11/21/20 1714 11/22/20 0630 11/22/20 0823 11/22/20 1857 11/23/20 0502  NA 137  --  137 140  --   --  139  --  140  K 5.7*  --  5.3* 3.9  --   --  3.3*  --  3.5  CL 88*  --   --  93*  --   --  96*  --  96*  CO2 36*  --   --  36*  --   --  31  --  32  GLUCOSE 254*  --   --  146*  --   --  265*  --  148*  BUN 51*  --   --  51*  --   --  36*  --  24*  CREATININE 0.59*  --   --  0.59*  --   --  0.54*  --  0.44*  CALCIUM 9.7  --   --  9.2  --   --  8.3*  --  8.5*  MG 2.0   < >  --  1.8 1.8 1.8  --  1.8 1.8  PHOS  --   --   --  3.1 3.2 2.7  --  3.2 2.6   < > = values in this interval not displayed.    ABG: Recent Labs  Lab 11/20/20 2240 11/21/20 0100 11/21/20 0330  11/21/20 0616  PHART 7.207* 7.265* 7.126* 7.361  PCO2ART 97.6* 86.8* >120.0* 78.7*  PO2ART 90.3 80.7* 180* 310*  HCO3 38.6* 39.3* 40.2* 44.5*  O2SAT 97.4 96.9 99.3 100.0    Liver Function Tests: Recent Labs  Lab 11/21/20 1046  AST 20  ALT 29  ALKPHOS 73  BILITOT 0.5  PROT 5.1*  ALBUMIN 2.9*   No results for input(s): LIPASE, AMYLASE in the last 168 hours. No results for input(s): AMMONIA in the last 168 hours.  CBC: Recent Labs  Lab 11/21/20 0027 11/21/20 0616 11/21/20 1046 11/22/20 0630 11/23/20 0502  WBC 27.0*  --  21.1* 11.3* 11.0*  NEUTROABS  --   --  18.3*  --   --   HGB 10.6* 9.9* 8.9* 8.2* 8.2*  HCT 31.8* 29.0* 26.6* 25.5* 25.2*  MCV 95.5  --  95.7  101.2* 99.2  PLT 259  --  237 198 168    Cardiac Enzymes: No results for input(s): CKTOTAL, CKMB, CKMBINDEX, TROPONINI in the last 168 hours.  BNP (last 3 results) No results for input(s): BNP in the last 8760 hours.  ProBNP (last 3 results) No results for input(s): PROBNP in the last 8760 hours.  Radiological Exams: No results found.  Assessment/Plan Active Problems:   Acute on chronic respiratory failure with hypoxia (HCC)   COVID-19 virus infection   Tracheostomy status (HCC)   Severe sepsis (HCC)   1. Acute on chronic respiratory failure with hypoxia we will continue with pressure control titrate oxygen continue pulmonary toilet. 2. COVID-19 virus infection in recovery we will continue to follow 3. Tracheostomy remains in place 4. Severe sepsis resolved   I have personally seen and evaluated the patient, evaluated laboratory and imaging results, formulated the assessment and plan and placed orders. The Patient requires high complexity decision making with multiple systems involvement.  Rounds were done with the Respiratory Therapy Director and Staff therapists and discussed with nursing staff also.  Yevonne Pax, MD [facility] Pulmonary Critical Care Medicine Sleep Medicine

## 2020-11-27 ENCOUNTER — Other Ambulatory Visit (HOSPITAL_COMMUNITY): Payer: Self-pay

## 2020-11-27 DIAGNOSIS — Z930 Tracheostomy status: Secondary | ICD-10-CM | POA: Diagnosis not present

## 2020-11-27 DIAGNOSIS — U071 COVID-19: Secondary | ICD-10-CM | POA: Diagnosis not present

## 2020-11-27 DIAGNOSIS — J9621 Acute and chronic respiratory failure with hypoxia: Secondary | ICD-10-CM | POA: Diagnosis not present

## 2020-11-27 DIAGNOSIS — A419 Sepsis, unspecified organism: Secondary | ICD-10-CM | POA: Diagnosis not present

## 2020-11-27 LAB — CBC
HCT: 33.7 % — ABNORMAL LOW (ref 39.0–52.0)
Hemoglobin: 10.7 g/dL — ABNORMAL LOW (ref 13.0–17.0)
MCH: 31.7 pg (ref 26.0–34.0)
MCHC: 31.8 g/dL (ref 30.0–36.0)
MCV: 99.7 fL (ref 80.0–100.0)
Platelets: 149 10*3/uL — ABNORMAL LOW (ref 150–400)
RBC: 3.38 MIL/uL — ABNORMAL LOW (ref 4.22–5.81)
RDW: 16.5 % — ABNORMAL HIGH (ref 11.5–15.5)
WBC: 7.4 10*3/uL (ref 4.0–10.5)
nRBC: 0 % (ref 0.0–0.2)

## 2020-11-27 LAB — BASIC METABOLIC PANEL
Anion gap: 12 (ref 5–15)
BUN: 28 mg/dL — ABNORMAL HIGH (ref 6–20)
CO2: 32 mmol/L (ref 22–32)
Calcium: 9.2 mg/dL (ref 8.9–10.3)
Chloride: 96 mmol/L — ABNORMAL LOW (ref 98–111)
Creatinine, Ser: 0.38 mg/dL — ABNORMAL LOW (ref 0.61–1.24)
GFR, Estimated: >60 mL/min (ref >60)
Glucose, Bld: 196 mg/dL — ABNORMAL HIGH (ref 70–99)
Potassium: 4.2 mmol/L (ref 3.5–5.1)
Sodium: 140 mmol/L (ref 135–145)

## 2020-11-27 NOTE — Progress Notes (Signed)
Pulmonary Critical Care Medicine [facility] [place]   PULMONARY CRITICAL CARE SERVICE  PROGRESS NOTE  Date of Service: 11/27/2020  Lawrence Freeman  WJX:[id number]  DOB: [birth year]   DOA: 11/23/2020  Referring Physician: Carron Curie, MD  HPI: Lawrence Freeman is a 49 y.o. male seen for follow up of Acute on Chronic Respiratory Failure.  Patient is on full support on pressure control has been on 40% FiO2 with an IP of 22  Medications: Reviewed on Rounds  Physical Exam:  Vitals: Temperature is 96.7 pulse 106 respiratory rate 21 blood pressure is 103/68 saturations 98%  Ventilator Settings on pressure assist control FiO2 40% IP 22 PEEP 5  . General: Comfortable at this time . Eyes: Grossly normal lids, irises & conjunctiva . ENT: grossly tongue is normal . Neck: no obvious mass . Cardiovascular: S1 S2 normal no gallop . Respiratory: Scattered rhonchi no rales . Abdomen: soft . Skin: no rash seen on limited exam . Musculoskeletal: not rigid . Psychiatric:unable to assess . Neurologic: no seizure no involuntary movements         Lab Data:   Basic Metabolic Panel: Recent Labs  Lab 11/21/20 0027 11/21/20 0027 11/21/20 0616 11/21/20 1046 11/21/20 1714 11/22/20 0630 11/22/20 0823 11/22/20 1857 11/23/20 0502 11/27/20 0532  NA 137   < > 137 140  --   --  139  --  140 140  K 5.7*   < > 5.3* 3.9  --   --  3.3*  --  3.5 4.2  CL 88*  --   --  93*  --   --  96*  --  96* 96*  CO2 36*  --   --  36*  --   --  31  --  32 32  GLUCOSE 254*  --   --  146*  --   --  265*  --  148* 196*  BUN 51*  --   --  51*  --   --  36*  --  24* 28*  CREATININE 0.59*  --   --  0.59*  --   --  0.54*  --  0.44* 0.38*  CALCIUM 9.7  --   --  9.2  --   --  8.3*  --  8.5* 9.2  MG 2.0   < >  --  1.8 1.8 1.8  --  1.8 1.8  --   PHOS  --   --   --  3.1 3.2 2.7  --  3.2 2.6  --    < > = values in this interval not displayed.    ABG: Recent Labs  Lab 11/20/20 2240 11/21/20 0100  11/21/20 0330 11/21/20 0616  PHART 7.207* 7.265* 7.126* 7.361  PCO2ART 97.6* 86.8* >120.0* 78.7*  PO2ART 90.3 80.7* 180* 310*  HCO3 38.6* 39.3* 40.2* 44.5*  O2SAT 97.4 96.9 99.3 100.0    Liver Function Tests: Recent Labs  Lab 11/21/20 1046  AST 20  ALT 29  ALKPHOS 73  BILITOT 0.5  PROT 5.1*  ALBUMIN 2.9*   No results for input(s): LIPASE, AMYLASE in the last 168 hours. No results for input(s): AMMONIA in the last 168 hours.  CBC: Recent Labs  Lab 11/21/20 0027 11/21/20 0027 11/21/20 0616 11/21/20 1046 11/22/20 0630 11/23/20 0502 11/27/20 0532  WBC 27.0*  --   --  21.1* 11.3* 11.0* 7.4  NEUTROABS  --   --   --  18.3*  --   --   --  HGB 10.6*   < > 9.9* 8.9* 8.2* 8.2* 10.7*  HCT 31.8*   < > 29.0* 26.6* 25.5* 25.2* 33.7*  MCV 95.5  --   --  95.7 101.2* 99.2 99.7  PLT 259  --   --  237 198 168 149*   < > = values in this interval not displayed.    Cardiac Enzymes: No results for input(s): CKTOTAL, CKMB, CKMBINDEX, TROPONINI in the last 168 hours.  BNP (last 3 results) No results for input(s): BNP in the last 8760 hours.  ProBNP (last 3 results) No results for input(s): PROBNP in the last 8760 hours.  Radiological Exams: DG CHEST PORT 1 VIEW  Result Date: 11/27/2020 CLINICAL DATA:  Respiratory failure. History of coronary artery disease, COVID, hypertension and pneumonia. EXAM: PORTABLE CHEST 1 VIEW COMPARISON:  Chest x-rays dated 11/23/2020 and 11/21/2021. FINDINGS: Tracheostomy tube appears appropriately positioned in the midline. Patchy ground-glass airspace opacities are again seen bilaterally, not significantly changed. No new lung findings. No pleural effusion or pneumothorax is seen. Heart size and mediastinal contours are stable. IMPRESSION: Stable chest x-ray.  Stable multifocal pneumonia. Electronically Signed   By: Bary Richard M.D.   On: 11/27/2020 07:11    Assessment/Plan Active Problems:   Acute on chronic respiratory failure with hypoxia (HCC)    COVID-19 virus infection   Tracheostomy status (HCC)   Severe sepsis (HCC)   1. Acute on chronic respiratory failure hypoxia patient will continue with full support on the ventilator.  Yesterday was tried on weaning only lasted about 30 minutes.  Respiratory therapy will reassess again today 2. COVID-19 virus infection recovery 3. Tracheostomy more stable rarely now we will continue to monitor 4. Severe sepsis resolved   I have personally seen and evaluated the patient, evaluated laboratory and imaging results, formulated the assessment and plan and placed orders. The Patient requires high complexity decision making with multiple systems involvement.  Rounds were done with the Respiratory Therapy Director and Staff therapists and discussed with nursing staff also.  Yevonne Pax, MD [facility] Pulmonary Critical Care Medicine Sleep Medicine

## 2020-11-28 DIAGNOSIS — Z930 Tracheostomy status: Secondary | ICD-10-CM | POA: Diagnosis not present

## 2020-11-28 DIAGNOSIS — J9621 Acute and chronic respiratory failure with hypoxia: Secondary | ICD-10-CM | POA: Diagnosis not present

## 2020-11-28 DIAGNOSIS — A419 Sepsis, unspecified organism: Secondary | ICD-10-CM | POA: Diagnosis not present

## 2020-11-28 DIAGNOSIS — U071 COVID-19: Secondary | ICD-10-CM | POA: Diagnosis not present

## 2020-11-28 NOTE — Progress Notes (Signed)
Pulmonary Critical Care Medicine [facility] [place]   PULMONARY CRITICAL CARE SERVICE  PROGRESS NOTE  Date of Service: 11/28/2020  Lawrence Freeman  OVF:[id number]  DOB: 03-27-1971   DOA: 11/23/2020  Referring Physician: Carron Curie, MD  HPI: Lawrence Freeman is a 49 y.o. male seen for follow up of Acute on Chronic Respiratory Failure.  This morning patient was on full support and pressure control mode has been on 40% FiO2 with an IP of 22 and a PEEP of 5  Medications: Reviewed on Rounds  Physical Exam:  Vitals: Temperature is 97.3 pulse 111 respiratory 22 blood pressure is 125/68 saturations 99%  Ventilator Settings on pressure assist control FiO2 is 40% IP 22 PEEP 5  . General: Comfortable at this time . Eyes: Grossly normal lids, irises & conjunctiva . ENT: grossly tongue is normal . Neck: no obvious mass . Cardiovascular: S1 S2 normal no gallop . Respiratory: No rhonchi very coarse breath sounds . Abdomen: soft . Skin: no rash seen on limited exam . Musculoskeletal: not rigid . Psychiatric:unable to assess . Neurologic: no seizure no involuntary movements         Lab Data:   Basic Metabolic Panel: Recent Labs  Lab 11/21/20 1046 11/21/20 1714 11/22/20 0630 11/22/20 0823 11/22/20 1857 11/23/20 0502 11/27/20 0532  NA 140  --   --  139  --  140 140  K 3.9  --   --  3.3*  --  3.5 4.2  CL 93*  --   --  96*  --  96* 96*  CO2 36*  --   --  31  --  32 32  GLUCOSE 146*  --   --  265*  --  148* 196*  BUN 51*  --   --  36*  --  24* 28*  CREATININE 0.59*  --   --  0.54*  --  0.44* 0.38*  CALCIUM 9.2  --   --  8.3*  --  8.5* 9.2  MG 1.8 1.8 1.8  --  1.8 1.8  --   PHOS 3.1 3.2 2.7  --  3.2 2.6  --     ABG: No results for input(s): PHART, PCO2ART, PO2ART, HCO3, O2SAT in the last 168 hours.  Liver Function Tests: Recent Labs  Lab 11/21/20 1046  AST 20  ALT 29  ALKPHOS 73  BILITOT 0.5  PROT 5.1*  ALBUMIN 2.9*   No results for input(s):  LIPASE, AMYLASE in the last 168 hours. No results for input(s): AMMONIA in the last 168 hours.  CBC: Recent Labs  Lab 11/21/20 1046 11/22/20 0630 11/23/20 0502 11/27/20 0532  WBC 21.1* 11.3* 11.0* 7.4  NEUTROABS 18.3*  --   --   --   HGB 8.9* 8.2* 8.2* 10.7*  HCT 26.6* 25.5* 25.2* 33.7*  MCV 95.7 101.2* 99.2 99.7  PLT 237 198 168 149*    Cardiac Enzymes: No results for input(s): CKTOTAL, CKMB, CKMBINDEX, TROPONINI in the last 168 hours.  BNP (last 3 results) No results for input(s): BNP in the last 8760 hours.  ProBNP (last 3 results) No results for input(s): PROBNP in the last 8760 hours.  Radiological Exams: DG CHEST PORT 1 VIEW  Result Date: 11/27/2020 CLINICAL DATA:  Respiratory failure. History of coronary artery disease, COVID, hypertension and pneumonia. EXAM: PORTABLE CHEST 1 VIEW COMPARISON:  Chest x-rays dated 11/23/2020 and 11/21/2021. FINDINGS: Tracheostomy tube appears appropriately positioned in the midline. Patchy ground-glass airspace opacities are again seen  bilaterally, not significantly changed. No new lung findings. No pleural effusion or pneumothorax is seen. Heart size and mediastinal contours are stable. IMPRESSION: Stable chest x-ray.  Stable multifocal pneumonia. Electronically Signed   By: Bary Richard M.D.   On: 11/27/2020 07:11    Assessment/Plan Active Problems:   Acute on chronic respiratory failure with hypoxia (HCC)   COVID-19 virus infection   Tracheostomy status (HCC)   Severe sepsis (HCC)   1. Acute on chronic respiratory failure hypoxia we will continue with pressure assist control.  Now on 40% FiO2 with an IP of 22 currently PEEP is at 5 2. COVID-19 virus infection recovery phase 3. Tracheostomy remains in place 4. Severe sepsis resolved we will continue to monitor   I have personally seen and evaluated the patient, evaluated laboratory and imaging results, formulated the assessment and plan and placed orders. The Patient requires  high complexity decision making with multiple systems involvement.  Rounds were done with the Respiratory Therapy Director and Staff therapists and discussed with nursing staff also.  Yevonne Pax, MD [facility] Pulmonary Critical Care Medicine Sleep Medicine

## 2020-11-30 DIAGNOSIS — U071 COVID-19: Secondary | ICD-10-CM | POA: Diagnosis not present

## 2020-11-30 DIAGNOSIS — A419 Sepsis, unspecified organism: Secondary | ICD-10-CM | POA: Diagnosis not present

## 2020-11-30 DIAGNOSIS — J9621 Acute and chronic respiratory failure with hypoxia: Secondary | ICD-10-CM | POA: Diagnosis not present

## 2020-11-30 DIAGNOSIS — Z930 Tracheostomy status: Secondary | ICD-10-CM | POA: Diagnosis not present

## 2020-11-30 NOTE — Progress Notes (Signed)
Pulmonary Critical Care Medicine [facility] [place]   PULMONARY CRITICAL CARE SERVICE  PROGRESS NOTE  Date of Service: 11/30/2020  Lawrence Freeman  [id number]:[id number]  DOB: 02/06/1971   DOA: 11/23/2020  Referring Physician: Carron Curie, MD  HPI: Lawrence Freeman is a 49 y.o. male seen for follow up of Acute on Chronic Respiratory Failure.  Patient was attempted on pressure support once again did not tolerated.  Medications: Reviewed on Rounds  Physical Exam:  Vitals: Temperature is 98 pulse 99 respiratory rate 28 saturations 98%  Ventilator Settings pressure control ventilation on FiO2 of 40% IP 22 PEEP 5 tidal volume 440  . General: Comfortable at this time . Eyes: Grossly normal lids, irises & conjunctiva . ENT: grossly tongue is normal . Neck: no obvious mass . Cardiovascular: S1 S2 normal no gallop . Respiratory: No rhonchi very coarse breath sounds . Abdomen: soft . Skin: no rash seen on limited exam . Musculoskeletal: not rigid . Psychiatric:unable to assess . Neurologic: no seizure no involuntary movements         Lab Data:   Basic Metabolic Panel: Recent Labs  Lab 11/27/20 0532  NA 140  K 4.2  CL 96*  CO2 32  GLUCOSE 196*  BUN 28*  CREATININE 0.38*  CALCIUM 9.2    ABG: No results for input(s): PHART, PCO2ART, PO2ART, HCO3, O2SAT in the last 168 hours.  Liver Function Tests: No results for input(s): AST, ALT, ALKPHOS, BILITOT, PROT, ALBUMIN in the last 168 hours. No results for input(s): LIPASE, AMYLASE in the last 168 hours. No results for input(s): AMMONIA in the last 168 hours.  CBC: Recent Labs  Lab 11/27/20 0532  WBC 7.4  HGB 10.7*  HCT 33.7*  MCV 99.7  PLT 149*    Cardiac Enzymes: No results for input(s): CKTOTAL, CKMB, CKMBINDEX, TROPONINI in the last 168 hours.  BNP (last 3 results) No results for input(s): BNP in the last 8760 hours.  ProBNP (last 3 results) No results for input(s): PROBNP in the last  8760 hours.  Radiological Exams: No results found.  Assessment/Plan Active Problems:   Acute on chronic respiratory failure with hypoxia (HCC)   COVID-19 virus infection   Tracheostomy status (HCC)   Severe sepsis (HCC)   1. Chronic respiratory failure with hypoxia at this time patient is attempted several attempts on pressure support but has not done well. 2. COVID-19 virus infection in recovery phase  3. severe sepsis resolved 4. Tracheostomy status we will continue to monitor.   I have personally seen and evaluated the patient, evaluated laboratory and imaging results, formulated the assessment and plan and placed orders. The Patient requires high complexity decision making with multiple systems involvement.  Rounds were done with the Respiratory Therapy Director and Staff therapists and discussed with nursing staff also.  Yevonne Pax, MD [facility] Pulmonary Critical Care Medicine Sleep Medicine

## 2020-12-01 DIAGNOSIS — Z930 Tracheostomy status: Secondary | ICD-10-CM | POA: Diagnosis not present

## 2020-12-01 DIAGNOSIS — J9621 Acute and chronic respiratory failure with hypoxia: Secondary | ICD-10-CM | POA: Diagnosis not present

## 2020-12-01 DIAGNOSIS — U071 COVID-19: Secondary | ICD-10-CM | POA: Diagnosis not present

## 2020-12-01 DIAGNOSIS — A419 Sepsis, unspecified organism: Secondary | ICD-10-CM | POA: Diagnosis not present

## 2020-12-01 NOTE — Progress Notes (Signed)
Pulmonary Critical Care Medicine [facility] [place]   PULMONARY CRITICAL CARE SERVICE  PROGRESS NOTE  Date of Service: 12/01/2020  Kaulana Brindle  UXN:[id number]  DOB: 03/16/1971   DOA: 11/23/2020  Referring Physician: Carron Curie, MD  HPI: Jadarion Halbig is a 49 y.o. male seen for follow up of Acute on Chronic Respiratory Failure.  Patient currently is on pressure control mode has been on 30% FiO2 has not been tolerating any attempts at weaning.  Continues to have issues with tachycardia  Medications: Reviewed on Rounds  Physical Exam:  Vitals: Temperature is 97.0 pulse 108 respiratory 19 blood pressure is 92/51 saturations 95%  Ventilator Settings on pressure control FiO2 is 30% IP 22 PEEP is 5  . General: Comfortable at this time . Eyes: Grossly normal lids, irises & conjunctiva . ENT: grossly tongue is normal . Neck: no obvious mass . Cardiovascular: S1 S2 normal no gallop . Respiratory: No rhonchi no rales are noted at this time. . Abdomen: soft . Skin: no rash seen on limited exam . Musculoskeletal: not rigid . Psychiatric:unable to assess . Neurologic: no seizure no involuntary movements         Lab Data:   Basic Metabolic Panel: Recent Labs  Lab 11/27/20 0532  NA 140  K 4.2  CL 96*  CO2 32  GLUCOSE 196*  BUN 28*  CREATININE 0.38*  CALCIUM 9.2    ABG: No results for input(s): PHART, PCO2ART, PO2ART, HCO3, O2SAT in the last 168 hours.  Liver Function Tests: No results for input(s): AST, ALT, ALKPHOS, BILITOT, PROT, ALBUMIN in the last 168 hours. No results for input(s): LIPASE, AMYLASE in the last 168 hours. No results for input(s): AMMONIA in the last 168 hours.  CBC: Recent Labs  Lab 11/27/20 0532  WBC 7.4  HGB 10.7*  HCT 33.7*  MCV 99.7  PLT 149*    Cardiac Enzymes: No results for input(s): CKTOTAL, CKMB, CKMBINDEX, TROPONINI in the last 168 hours.  BNP (last 3 results) No results for input(s): BNP in the  last 8760 hours.  ProBNP (last 3 results) No results for input(s): PROBNP in the last 8760 hours.  Radiological Exams: No results found.  Assessment/Plan Active Problems:   Acute on chronic respiratory failure with hypoxia (HCC)   COVID-19 virus infection   Tracheostomy status (HCC)   Severe sepsis (HCC)   1. Acute on chronic respiratory failure hypoxia we will continue with full support right now on the ventilator patient is consistently failing.  Patient also significant tachycardia.  Suggest follow-up with cardiology 2. COVID-19 virus infection treated in resolution 3. Tracheostomy remains in place 4. Severe sepsis resolved right now hemodynamics are stable patient still having issues with tachycardia   I have personally seen and evaluated the patient, evaluated laboratory and imaging results, formulated the assessment and plan and placed orders. The Patient requires high complexity decision making with multiple systems involvement.  Rounds were done with the Respiratory Therapy Director and Staff therapists and discussed with nursing staff also.  Yevonne Pax, MD [facility] Pulmonary Critical Care Medicine Sleep Medicine

## 2020-12-02 DIAGNOSIS — J9621 Acute and chronic respiratory failure with hypoxia: Secondary | ICD-10-CM | POA: Diagnosis not present

## 2020-12-02 DIAGNOSIS — A419 Sepsis, unspecified organism: Secondary | ICD-10-CM | POA: Diagnosis not present

## 2020-12-02 DIAGNOSIS — U071 COVID-19: Secondary | ICD-10-CM | POA: Diagnosis not present

## 2020-12-02 DIAGNOSIS — Z930 Tracheostomy status: Secondary | ICD-10-CM | POA: Diagnosis not present

## 2020-12-02 NOTE — Progress Notes (Signed)
Pulmonary Critical Care Medicine [facility] [place]   PULMONARY CRITICAL CARE SERVICE  PROGRESS NOTE  Date of Service: 12/02/2020  Lawrence Freeman  EUM:[id number]  DOB: November 29, 1971   DOA: 11/23/2020  Referring Physician: Carron Curie, MD  HPI: Lawrence Freeman is a 49 y.o. male seen for follow up of Acute on Chronic Respiratory Failure.  On pressure control mode currently on 40% FiO2 good saturations are noted  Medications: Reviewed on Rounds  Physical Exam:  Vitals: Temperature is 96.9 pulse 117 respiratory 22 blood pressure is 123/55 saturations 98%  Ventilator Settings on pressure control FiO2 is 40% IP 22 PEEP 5  . General: Comfortable at this time . Eyes: Grossly normal lids, irises & conjunctiva . ENT: grossly tongue is normal . Neck: no obvious mass . Cardiovascular: S1 S2 normal no gallop . Respiratory: No rhonchi very coarse breath sounds . Abdomen: soft . Skin: no rash seen on limited exam . Musculoskeletal: not rigid . Psychiatric:unable to assess . Neurologic: no seizure no involuntary movements         Lab Data:   Basic Metabolic Panel: Recent Labs  Lab 11/27/20 0532  NA 140  K 4.2  CL 96*  CO2 32  GLUCOSE 196*  BUN 28*  CREATININE 0.38*  CALCIUM 9.2    ABG: No results for input(s): PHART, PCO2ART, PO2ART, HCO3, O2SAT in the last 168 hours.  Liver Function Tests: No results for input(s): AST, ALT, ALKPHOS, BILITOT, PROT, ALBUMIN in the last 168 hours. No results for input(s): LIPASE, AMYLASE in the last 168 hours. No results for input(s): AMMONIA in the last 168 hours.  CBC: Recent Labs  Lab 11/27/20 0532  WBC 7.4  HGB 10.7*  HCT 33.7*  MCV 99.7  PLT 149*    Cardiac Enzymes: No results for input(s): CKTOTAL, CKMB, CKMBINDEX, TROPONINI in the last 168 hours.  BNP (last 3 results) No results for input(s): BNP in the last 8760 hours.  ProBNP (last 3 results) No results for input(s): PROBNP in the last 8760  hours.  Radiological Exams: No results found.  Assessment/Plan Active Problems:   Acute on chronic respiratory failure with hypoxia (HCC)   COVID-19 virus infection   Tracheostomy status (HCC)   Severe sepsis (HCC)   1. Acute on chronic respiratory failure hypoxia patient is on pressure control currently on 40% FiO2 2. COVID-19 virus infection recovery 3. Tracheostomy remains in place 4. Severe sepsis resolved   I have personally seen and evaluated the patient, evaluated laboratory and imaging results, formulated the assessment and plan and placed orders. The Patient requires high complexity decision making with multiple systems involvement.  Rounds were done with the Respiratory Therapy Director and Staff therapists and discussed with nursing staff also.  Yevonne Pax, MD [facility] Pulmonary Critical Care Medicine Sleep Medicine

## 2020-12-03 DIAGNOSIS — Z930 Tracheostomy status: Secondary | ICD-10-CM | POA: Diagnosis not present

## 2020-12-03 DIAGNOSIS — J9621 Acute and chronic respiratory failure with hypoxia: Secondary | ICD-10-CM | POA: Diagnosis not present

## 2020-12-03 DIAGNOSIS — U071 COVID-19: Secondary | ICD-10-CM | POA: Diagnosis not present

## 2020-12-03 DIAGNOSIS — A419 Sepsis, unspecified organism: Secondary | ICD-10-CM | POA: Diagnosis not present

## 2020-12-03 LAB — BASIC METABOLIC PANEL
Anion gap: 11 (ref 5–15)
BUN: 35 mg/dL — ABNORMAL HIGH (ref 6–20)
CO2: 33 mmol/L — ABNORMAL HIGH (ref 22–32)
Calcium: 9.4 mg/dL (ref 8.9–10.3)
Chloride: 94 mmol/L — ABNORMAL LOW (ref 98–111)
Creatinine, Ser: 0.56 mg/dL — ABNORMAL LOW (ref 0.61–1.24)
GFR, Estimated: >60 mL/min (ref >60)
Glucose, Bld: 149 mg/dL — ABNORMAL HIGH (ref 70–99)
Potassium: 4.5 mmol/L (ref 3.5–5.1)
Sodium: 138 mmol/L (ref 135–145)

## 2020-12-03 LAB — CBC
HCT: 28.5 % — ABNORMAL LOW (ref 39.0–52.0)
Hemoglobin: 9.2 g/dL — ABNORMAL LOW (ref 13.0–17.0)
MCH: 32.2 pg (ref 26.0–34.0)
MCHC: 32.3 g/dL (ref 30.0–36.0)
MCV: 99.7 fL (ref 80.0–100.0)
Platelets: 225 10*3/uL (ref 150–400)
RBC: 2.86 MIL/uL — ABNORMAL LOW (ref 4.22–5.81)
RDW: 15.9 % — ABNORMAL HIGH (ref 11.5–15.5)
WBC: 10.3 10*3/uL (ref 4.0–10.5)
nRBC: 0 % (ref 0.0–0.2)

## 2020-12-03 LAB — MAGNESIUM: Magnesium: 1.9 mg/dL (ref 1.7–2.4)

## 2020-12-03 NOTE — Progress Notes (Signed)
Pulmonary Critical Care Medicine [facility] [place]   PULMONARY CRITICAL CARE SERVICE  PROGRESS NOTE  Date of Service: 12/03/2020  Cuauhtemoc Huegel  XFG:[id number]  DOB: 1971-12-14   DOA: 11/23/2020  Referring Physician: Carron Curie, MD  HPI: Jonhatan Hearty is a 49 y.o. male seen for follow up of Acute on Chronic Respiratory Failure.  Patient currently is on pressure support has been on 40% FiO2 with a pressure of 14/5  Medications: Reviewed on Rounds  Physical Exam:  Vitals: Temperature is 96.8 pulse 112 respiratory 19 blood pressure is 94 saturations 90  Ventilator Settings on pressure support FiO2 40% pressure 14/5  . General: Comfortable at this time . Eyes: Grossly normal lids, irises & conjunctiva . ENT: grossly tongue is normal . Neck: no obvious mass . Cardiovascular: S1 S2 normal no gallop . Respiratory: No rhonchi no rales noted . Abdomen: soft . Skin: no rash seen on limited exam . Musculoskeletal: not rigid . Psychiatric:unable to assess . Neurologic: no seizure no involuntary movements         Lab Data:   Basic Metabolic Panel: Recent Labs  Lab 11/27/20 0532 12/03/20 0558  NA 140 138  K 4.2 4.5  CL 96* 94*  CO2 32 33*  GLUCOSE 196* 149*  BUN 28* 35*  CREATININE 0.38* 0.56*  CALCIUM 9.2 9.4  MG  --  1.9    ABG: No results for input(s): PHART, PCO2ART, PO2ART, HCO3, O2SAT in the last 168 hours.  Liver Function Tests: No results for input(s): AST, ALT, ALKPHOS, BILITOT, PROT, ALBUMIN in the last 168 hours. No results for input(s): LIPASE, AMYLASE in the last 168 hours. No results for input(s): AMMONIA in the last 168 hours.  CBC: Recent Labs  Lab 11/27/20 0532 12/03/20 0558  WBC 7.4 10.3  HGB 10.7* 9.2*  HCT 33.7* 28.5*  MCV 99.7 99.7  PLT 149* 225    Cardiac Enzymes: No results for input(s): CKTOTAL, CKMB, CKMBINDEX, TROPONINI in the last 168 hours.  BNP (last 3 results) No results for input(s): BNP in  the last 8760 hours.  ProBNP (last 3 results) No results for input(s): PROBNP in the last 8760 hours.  Radiological Exams: No results found.  Assessment/Plan Active Problems:   Acute on chronic respiratory failure with hypoxia (HCC)   COVID-19 virus infection   Tracheostomy status (HCC)   Severe sepsis (HCC)   1. Acute on chronic respiratory failure hypoxia we will continue with pressure support titrate oxygen continue pulmonary toilet. 2. COVID-19 virus infection treated in resolution 3. Severe sepsis resolved 4. Tracheostomy remains in place   I have personally seen and evaluated the patient, evaluated laboratory and imaging results, formulated the assessment and plan and placed orders. The Patient requires high complexity decision making with multiple systems involvement.  Rounds were done with the Respiratory Therapy Director and Staff therapists and discussed with nursing staff also.  Yevonne Pax, MD [facility] Pulmonary Critical Care Medicine Sleep Medicine

## 2020-12-04 ENCOUNTER — Other Ambulatory Visit (HOSPITAL_COMMUNITY): Payer: Self-pay

## 2020-12-04 DIAGNOSIS — J9621 Acute and chronic respiratory failure with hypoxia: Secondary | ICD-10-CM | POA: Diagnosis not present

## 2020-12-04 DIAGNOSIS — A419 Sepsis, unspecified organism: Secondary | ICD-10-CM | POA: Diagnosis not present

## 2020-12-04 DIAGNOSIS — U071 COVID-19: Secondary | ICD-10-CM | POA: Diagnosis not present

## 2020-12-04 DIAGNOSIS — Z930 Tracheostomy status: Secondary | ICD-10-CM | POA: Diagnosis not present

## 2020-12-04 NOTE — Progress Notes (Signed)
Pulmonary Critical Care Medicine [facility] [place]   PULMONARY CRITICAL CARE SERVICE  PROGRESS NOTE  Date of Service: 12/04/2020  Lawrence Freeman  [id number]  DOB: [birth year]   DOA: 11/23/2020  Referring Physician: Carron Curie, MD  HPI: Lawrence Freeman is a 49 y.o. male seen for follow up of Acute on Chronic Respiratory Failure.  Patient currently is on pressure support mode the goal today is for 6 hours  Medications: Reviewed on Rounds  Physical Exam:  Vitals: Temperature 97.0 pulse 106 respiratory rate 20 blood pressures 97/59 saturations 99%  Ventilator Settings on pressure support FiO2 is 40% pressure of 14/5  . General: Comfortable at this time . Eyes: Grossly normal lids, irises & conjunctiva . ENT: grossly tongue is normal . Neck: no obvious mass . Cardiovascular: S1 S2 normal no gallop . Respiratory: No rhonchi very coarse breath sounds . Abdomen: soft . Skin: no rash seen on limited exam . Musculoskeletal: not rigid . Psychiatric:unable to assess . Neurologic: no seizure no involuntary movements         Lab Data:   Basic Metabolic Panel: Recent Labs  Lab 12/03/20 0558  NA 138  K 4.5  CL 94*  CO2 33*  GLUCOSE 149*  BUN 35*  CREATININE 0.56*  CALCIUM 9.4  MG 1.9    ABG: No results for input(s): PHART, PCO2ART, PO2ART, HCO3, O2SAT in the last 168 hours.  Liver Function Tests: No results for input(s): AST, ALT, ALKPHOS, BILITOT, PROT, ALBUMIN in the last 168 hours. No results for input(s): LIPASE, AMYLASE in the last 168 hours. No results for input(s): AMMONIA in the last 168 hours.  CBC: Recent Labs  Lab 12/03/20 0558  WBC 10.3  HGB 9.2*  HCT 28.5*  MCV 99.7  PLT 225    Cardiac Enzymes: No results for input(s): CKTOTAL, CKMB, CKMBINDEX, TROPONINI in the last 168 hours.  BNP (last 3 results) No results for input(s): BNP in the last 8760 hours.  ProBNP (last 3 results) No results for input(s): PROBNP in  the last 8760 hours.  Radiological Exams: No results found.  Assessment/Plan Active Problems:   Acute on chronic respiratory failure with hypoxia (HCC)   COVID-19 virus infection   Tracheostomy status (HCC)   Severe sepsis (HCC)   1. Acute on chronic respiratory failure hypoxia we will continue with pressure support titrate oxygen continue pulmonary toilet. 2. COVID-19 virus infection recovery phase we will continue to follow 3. Tracheostomy remains in place 4. Severe sepsis resolved hemodynamics are still stable   I have personally seen and evaluated the patient, evaluated laboratory and imaging results, formulated the assessment and plan and placed orders. The Patient requires high complexity decision making with multiple systems involvement.  Rounds were done with the Respiratory Therapy Director and Staff therapists and discussed with nursing staff also.  Yevonne Pax, MD [facility] Pulmonary Critical Care Medicine Sleep Medicine

## 2020-12-05 DIAGNOSIS — U071 COVID-19: Secondary | ICD-10-CM | POA: Diagnosis not present

## 2020-12-05 DIAGNOSIS — Z930 Tracheostomy status: Secondary | ICD-10-CM | POA: Diagnosis not present

## 2020-12-05 DIAGNOSIS — J9621 Acute and chronic respiratory failure with hypoxia: Secondary | ICD-10-CM | POA: Diagnosis not present

## 2020-12-05 DIAGNOSIS — A419 Sepsis, unspecified organism: Secondary | ICD-10-CM | POA: Diagnosis not present

## 2020-12-05 LAB — CULTURE, RESPIRATORY W GRAM STAIN

## 2020-12-05 NOTE — Progress Notes (Addendum)
Pulmonary Critical Care Medicine [facility] [place]   PULMONARY CRITICAL CARE SERVICE  PROGRESS NOTE  Date of Service: 12/05/2020  Lawrence Freeman  [id number]  DOB: 1971-05-20   DOA: 11/23/2020  Referring Physician: Carron Curie, MD  HPI: Lawrence Freeman is a 49 y.o. male seen for follow up of Acute on Chronic Respiratory Failure.  Patient is currently on pressure support has been on 40% FiO2 currently on pressure of 14/5  Medications: Reviewed on Rounds  Physical Exam:  Vitals: Temperature is 96.1 pulse 98 respiratory rate is 18 blood pressure is 99/54 saturations 100%  Ventilator Settings on pressure support FiO2 40% pressure 14/5  . General: Comfortable at this time . Eyes: Grossly normal lids, irises & conjunctiva . ENT: grossly tongue is normal . Neck: no obvious mass . Cardiovascular: S1 S2 normal no gallop . Respiratory: No rhonchi no rales are noted at this time. . Abdomen: soft . Skin: no rash seen on limited exam . Musculoskeletal: not rigid . Psychiatric:unable to assess . Neurologic: no seizure no involuntary movements         Lab Data:   Basic Metabolic Panel: Recent Labs  Lab 12/03/20 0558  NA 138  K 4.5  CL 94*  CO2 33*  GLUCOSE 149*  BUN 35*  CREATININE 0.56*  CALCIUM 9.4  MG 1.9    ABG: No results for input(s): PHART, PCO2ART, PO2ART, HCO3, O2SAT in the last 168 hours.  Liver Function Tests: No results for input(s): AST, ALT, ALKPHOS, BILITOT, PROT, ALBUMIN in the last 168 hours. No results for input(s): LIPASE, AMYLASE in the last 168 hours. No results for input(s): AMMONIA in the last 168 hours.  CBC: Recent Labs  Lab 12/03/20 0558  WBC 10.3  HGB 9.2*  HCT 28.5*  MCV 99.7  PLT 225    Cardiac Enzymes: No results for input(s): CKTOTAL, CKMB, CKMBINDEX, TROPONINI in the last 168 hours.  BNP (last 3 results) No results for input(s): BNP in the last 8760 hours.  ProBNP (last 3 results) No results  for input(s): PROBNP in the last 8760 hours.  Radiological Exams: DG Chest Port 1 View  Result Date: 12/04/2020 CLINICAL DATA:  COVID-19 pneumonia. EXAM: PORTABLE CHEST 1 VIEW COMPARISON:  November 27, 2020. FINDINGS: The heart size and mediastinal contours are within normal limits. Tracheostomy is in good position. No pneumothorax or pleural effusion is noted. Stable bilateral patchy airspace opacities are noted consistent with multifocal pneumonia. The visualized skeletal structures are unremarkable. IMPRESSION: Stable bilateral multifocal pneumonia. Electronically Signed   By: Lupita Raider M.D.   On: 12/04/2020 13:38    Assessment/Plan Active Problems:   Acute on chronic respiratory failure with hypoxia (HCC)   COVID-19 virus infection   Tracheostomy status (HCC)   Severe sepsis (HCC)   1. Acute on chronic respiratory failure with hypoxia patient continues on pressure support FiO2 40% pressure of 14/5 2. COVID-19 virus infection in recovery 3. Tracheostomy remains in place 4. Severe sepsis resolved hemodynamics are stable.   I have personally seen and evaluated the patient, evaluated laboratory and imaging results, formulated the assessment and plan and placed orders. The Patient requires high complexity decision making with multiple systems involvement.  Rounds were done with the Respiratory Therapy Director and Staff therapists and discussed with nursing staff also.  Yevonne Pax, MD [facility] Pulmonary Critical Care Medicine Sleep Medicine

## 2020-12-06 DIAGNOSIS — Z930 Tracheostomy status: Secondary | ICD-10-CM | POA: Diagnosis not present

## 2020-12-06 DIAGNOSIS — J9621 Acute and chronic respiratory failure with hypoxia: Secondary | ICD-10-CM | POA: Diagnosis not present

## 2020-12-06 DIAGNOSIS — U071 COVID-19: Secondary | ICD-10-CM | POA: Diagnosis not present

## 2020-12-06 DIAGNOSIS — A419 Sepsis, unspecified organism: Secondary | ICD-10-CM | POA: Diagnosis not present

## 2020-12-06 NOTE — Progress Notes (Signed)
Pulmonary Critical Care Medicine [facility] [place]   PULMONARY CRITICAL CARE SERVICE  PROGRESS NOTE  Date of Service: 12/06/2020  Lawrence Freeman  PPI:[id number]  DOB: Feb 11, 1971   DOA: 11/23/2020  Referring Physician: Carron Curie, MD  HPI: Lawrence Freeman is a 49 y.o. male seen for follow up of Acute on Chronic Respiratory Failure.  Patient currently is on pressure control has been on 40% FiO2 good saturations are noted at this time.  Medications: Reviewed on Rounds  Physical Exam:  Vitals: Temperature 96.7 pulse 125 respiratory rate 32 blood pressure is 104/86 saturations 97%  Ventilator Settings on pressure control FiO2 has been on 40% FiO2 with an IP of 22 PEEP 5  . General: Comfortable at this time . Eyes: Grossly normal lids, irises & conjunctiva . ENT: grossly tongue is normal . Neck: no obvious mass . Cardiovascular: S1 S2 normal no gallop . Respiratory: No rhonchi no rales . Abdomen: soft . Skin: no rash seen on limited exam . Musculoskeletal: not rigid . Psychiatric:unable to assess . Neurologic: no seizure no involuntary movements         Lab Data:   Basic Metabolic Panel: Recent Labs  Lab 12/03/20 0558  NA 138  K 4.5  CL 94*  CO2 33*  GLUCOSE 149*  BUN 35*  CREATININE 0.56*  CALCIUM 9.4  MG 1.9    ABG: No results for input(s): PHART, PCO2ART, PO2ART, HCO3, O2SAT in the last 168 hours.  Liver Function Tests: No results for input(s): AST, ALT, ALKPHOS, BILITOT, PROT, ALBUMIN in the last 168 hours. No results for input(s): LIPASE, AMYLASE in the last 168 hours. No results for input(s): AMMONIA in the last 168 hours.  CBC: Recent Labs  Lab 12/03/20 0558  WBC 10.3  HGB 9.2*  HCT 28.5*  MCV 99.7  PLT 225    Cardiac Enzymes: No results for input(s): CKTOTAL, CKMB, CKMBINDEX, TROPONINI in the last 168 hours.  BNP (last 3 results) No results for input(s): BNP in the last 8760 hours.  ProBNP (last 3 results) No  results for input(s): PROBNP in the last 8760 hours.  Radiological Exams: No results found.  Assessment/Plan Active Problems:   Acute on chronic respiratory failure with hypoxia (HCC)   COVID-19 virus infection   Tracheostomy status (HCC)   Severe sepsis (HCC)   1. Acute on chronic respiratory failure hypoxia patient did wean on pressure support yesterday plan is going to be to continue to advance to wean on pressure support as tolerated continue secretion management pulmonary toilet. 2. COVID-19 virus infection in recovery 3. Tracheostomy remains in place 4. Severe sepsis at baseline resolved   I have personally seen and evaluated the patient, evaluated laboratory and imaging results, formulated the assessment and plan and placed orders. The Patient requires high complexity decision making with multiple systems involvement.  Rounds were done with the Respiratory Therapy Director and Staff therapists and discussed with nursing staff also.  Yevonne Pax, MD [facility] Pulmonary Critical Care Medicine Sleep Medicine

## 2020-12-07 DIAGNOSIS — U071 COVID-19: Secondary | ICD-10-CM | POA: Diagnosis not present

## 2020-12-07 DIAGNOSIS — J9621 Acute and chronic respiratory failure with hypoxia: Secondary | ICD-10-CM | POA: Diagnosis not present

## 2020-12-07 DIAGNOSIS — A419 Sepsis, unspecified organism: Secondary | ICD-10-CM | POA: Diagnosis not present

## 2020-12-07 DIAGNOSIS — Z930 Tracheostomy status: Secondary | ICD-10-CM | POA: Diagnosis not present

## 2020-12-07 LAB — CBC
HCT: 29.7 % — ABNORMAL LOW (ref 39.0–52.0)
Hemoglobin: 9.7 g/dL — ABNORMAL LOW (ref 13.0–17.0)
MCH: 32.2 pg (ref 26.0–34.0)
MCHC: 32.7 g/dL (ref 30.0–36.0)
MCV: 98.7 fL (ref 80.0–100.0)
Platelets: 369 10*3/uL (ref 150–400)
RBC: 3.01 MIL/uL — ABNORMAL LOW (ref 4.22–5.81)
RDW: 15.4 % (ref 11.5–15.5)
WBC: 8.5 10*3/uL (ref 4.0–10.5)
nRBC: 0 % (ref 0.0–0.2)

## 2020-12-07 LAB — MAGNESIUM: Magnesium: 2 mg/dL (ref 1.7–2.4)

## 2020-12-07 LAB — BASIC METABOLIC PANEL
Anion gap: 13 (ref 5–15)
BUN: 30 mg/dL — ABNORMAL HIGH (ref 6–20)
CO2: 32 mmol/L (ref 22–32)
Calcium: 9.4 mg/dL (ref 8.9–10.3)
Chloride: 91 mmol/L — ABNORMAL LOW (ref 98–111)
Creatinine, Ser: 0.64 mg/dL (ref 0.61–1.24)
GFR, Estimated: >60 mL/min (ref >60)
Glucose, Bld: 148 mg/dL — ABNORMAL HIGH (ref 70–99)
Potassium: 4.4 mmol/L (ref 3.5–5.1)
Sodium: 136 mmol/L (ref 135–145)

## 2020-12-08 DIAGNOSIS — J9621 Acute and chronic respiratory failure with hypoxia: Secondary | ICD-10-CM | POA: Diagnosis not present

## 2020-12-08 DIAGNOSIS — A419 Sepsis, unspecified organism: Secondary | ICD-10-CM | POA: Diagnosis not present

## 2020-12-08 DIAGNOSIS — U071 COVID-19: Secondary | ICD-10-CM | POA: Diagnosis not present

## 2020-12-08 DIAGNOSIS — Z930 Tracheostomy status: Secondary | ICD-10-CM | POA: Diagnosis not present

## 2020-12-08 NOTE — Progress Notes (Signed)
Pulmonary Critical Care Medicine [facility] [place]   PULMONARY CRITICAL CARE SERVICE  PROGRESS NOTE  Date of Service: 12/08/2020  Pius Byrom  [id number]  DOB: 03-06-1971   DOA: 11/23/2020  Referring Physician: Carron Curie, MD  HPI: Lawrence Freeman is a 49 y.o. male seen for follow up of Acute on Chronic Respiratory Failure.  Patient is on the ventilator and full support at baseline A lot of issues with anxiety yesterday primary team tried to change him over to volume cycled he did not tolerate it.  Right now is back on pressure control ventilation  Medications: Reviewed on Rounds  Physical Exam:  Vitals: Temperature is 98.1 pulse 122 respiratory 28 blood pressure is 112/70 saturations 97%  Ventilator Settings on pressure control ventilation FiO2 40% IP 22 PEEP 5 tidal volume 500  . General: Comfortable at this time . Eyes: Grossly normal lids, irises & conjunctiva . ENT: grossly tongue is normal . Neck: no obvious mass . Cardiovascular: S1 S2 normal no gallop . Respiratory: No rhonchi rales are noted at this time . Abdomen: soft . Skin: no rash seen on limited exam . Musculoskeletal: not rigid . Psychiatric:unable to assess . Neurologic: no seizure no involuntary movements         Lab Data:   Basic Metabolic Panel: Recent Labs  Lab 12/03/20 0558 12/07/20 0447  NA 138 136  K 4.5 4.4  CL 94* 91*  CO2 33* 32  GLUCOSE 149* 148*  BUN 35* 30*  CREATININE 0.56* 0.64  CALCIUM 9.4 9.4  MG 1.9 2.0    ABG: No results for input(s): PHART, PCO2ART, PO2ART, HCO3, O2SAT in the last 168 hours.  Liver Function Tests: No results for input(s): AST, ALT, ALKPHOS, BILITOT, PROT, ALBUMIN in the last 168 hours. No results for input(s): LIPASE, AMYLASE in the last 168 hours. No results for input(s): AMMONIA in the last 168 hours.  CBC: Recent Labs  Lab 12/03/20 0558 12/07/20 0447  WBC 10.3 8.5  HGB 9.2* 9.7*  HCT 28.5* 29.7*  MCV 99.7  98.7  PLT 225 369    Cardiac Enzymes: No results for input(s): CKTOTAL, CKMB, CKMBINDEX, TROPONINI in the last 168 hours.  BNP (last 3 results) No results for input(s): BNP in the last 8760 hours.  ProBNP (last 3 results) No results for input(s): PROBNP in the last 8760 hours.  Radiological Exams: No results found.  Assessment/Plan Active Problems:   Acute on chronic respiratory failure with hypoxia (HCC)   COVID-19 virus infection   Tracheostomy status (HCC)   Severe sepsis (HCC)   1. Acute on chronic respiratory failure hypoxia patient has been continuously failing weaning attempts.  Patient also did not tolerate attempt at volume cycle ventilation.  We will continue with pressure control at this time. 2. COVID-19 virus infection recovery is slow process with residual pulmonary damage 3. Tracheostomy will remain in place 4. Severe sepsis hemodynamics are stable resolved we will monitor   I have personally seen and evaluated the patient, evaluated laboratory and imaging results, formulated the assessment and plan and placed orders. The Patient requires high complexity decision making with multiple systems involvement.  Rounds were done with the Respiratory Therapy Director and Staff therapists and discussed with nursing staff also.  Yevonne Pax, MD [facility] Pulmonary Critical Care Medicine Sleep Medicine

## 2020-12-08 NOTE — Progress Notes (Signed)
Pulmonary Critical Care Medicine [facility] [place]   PULMONARY CRITICAL CARE SERVICE  PROGRESS NOTE  Date of Service: 12/07/2020  Lawrence Freeman  [id number]  DOB: 1971-07-29   DOA: 11/23/2020  Referring Physician: Carron Curie, MD  HPI: Lawrence Freeman is a 49 y.o. male seen for follow up of Acute on Chronic Respiratory Failure.  Patient currently is on pressure control mode has been on 40% FiO2 intermittently tolerating weaning suggested switching over to assist control if able to tolerate  Medications: Reviewed on Rounds  Physical Exam:  Vitals: Temperature is 97.2 pulse 109 respiratory rate 24 blood pressure is 96/59 saturations 99%  Ventilator Settings on pressure control FiO2 40% IP 22 PEEP 5  . General: Comfortable at this time . Eyes: Grossly normal lids, irises & conjunctiva . ENT: grossly tongue is normal . Neck: no obvious mass . Cardiovascular: S1 S2 normal no gallop . Respiratory: No rhonchi no rales noted at this . Abdomen: soft . Skin: no rash seen on limited exam . Musculoskeletal: not rigid . Psychiatric:unable to assess . Neurologic: no seizure no involuntary movements         Lab Data:   Basic Metabolic Panel: Recent Labs  Lab 12/03/20 0558 12/07/20 0447  NA 138 136  K 4.5 4.4  CL 94* 91*  CO2 33* 32  GLUCOSE 149* 148*  BUN 35* 30*  CREATININE 0.56* 0.64  CALCIUM 9.4 9.4  MG 1.9 2.0    ABG: No results for input(s): PHART, PCO2ART, PO2ART, HCO3, O2SAT in the last 168 hours.  Liver Function Tests: No results for input(s): AST, ALT, ALKPHOS, BILITOT, PROT, ALBUMIN in the last 168 hours. No results for input(s): LIPASE, AMYLASE in the last 168 hours. No results for input(s): AMMONIA in the last 168 hours.  CBC: Recent Labs  Lab 12/03/20 0558 12/07/20 0447  WBC 10.3 8.5  HGB 9.2* 9.7*  HCT 28.5* 29.7*  MCV 99.7 98.7  PLT 225 369    Cardiac Enzymes: No results for input(s): CKTOTAL, CKMB, CKMBINDEX,  TROPONINI in the last 168 hours.  BNP (last 3 results) No results for input(s): BNP in the last 8760 hours.  ProBNP (last 3 results) No results for input(s): PROBNP in the last 8760 hours.  Radiological Exams: No results found.  Assessment/Plan Active Problems:   Acute on chronic respiratory failure with hypoxia (HCC)   COVID-19 virus infection   Tracheostomy status (HCC)   Severe sepsis (HCC)   1. Acute on chronic respiratory failure with hypoxia patient is currently on pressure control we will switch over to volume assist control.  Try pressure support once patient stabilizes 2. COVID-19 virus infection recovery we will continue to monitor 3. Tracheostomy remains in place 4. Severe sepsis resolved   I have personally seen and evaluated the patient, evaluated laboratory and imaging results, formulated the assessment and plan and placed orders. The Patient requires high complexity decision making with multiple systems involvement.  Rounds were done with the Respiratory Therapy Director and Staff therapists and discussed with nursing staff also.  Yevonne Pax, MD [facility] Pulmonary Critical Care Medicine Sleep Medicine

## 2020-12-09 ENCOUNTER — Other Ambulatory Visit (HOSPITAL_COMMUNITY): Payer: Self-pay

## 2020-12-09 DIAGNOSIS — Z930 Tracheostomy status: Secondary | ICD-10-CM | POA: Diagnosis not present

## 2020-12-09 DIAGNOSIS — U071 COVID-19: Secondary | ICD-10-CM | POA: Diagnosis not present

## 2020-12-09 DIAGNOSIS — A419 Sepsis, unspecified organism: Secondary | ICD-10-CM | POA: Diagnosis not present

## 2020-12-09 DIAGNOSIS — J9621 Acute and chronic respiratory failure with hypoxia: Secondary | ICD-10-CM | POA: Diagnosis not present

## 2020-12-09 NOTE — Progress Notes (Signed)
Pulmonary Critical Care Medicine [facility] [place]   PULMONARY CRITICAL CARE SERVICE  PROGRESS NOTE  Date of Service: 12/09/2020  Lawrence Freeman  [id number]  DOB: 1971-11-13   DOA: 11/23/2020  Referring Physician: Carron Curie, MD  HPI: Lawrence Freeman is a 49 y.o. male seen for follow up of Acute on Chronic Respiratory Failure.  Patient currently is on full support and pressure control mode has been on 40% FiO2.  Medications: Reviewed on Rounds  Physical Exam:  Vitals: Temperature is 98.2 pulse 120 respiratory 25 blood pressure is 112/68 saturations 98%  Ventilator Settings on pressure control FiO2 is 40% IP 25 PEEP 5  . General: Comfortable at this time . Eyes: Grossly normal lids, irises & conjunctiva . ENT: grossly tongue is normal . Neck: no obvious mass . Cardiovascular: S1 S2 normal no gallop . Respiratory: No rhonchi very coarse breath . Abdomen: soft . Skin: no rash seen on limited exam . Musculoskeletal: not rigid . Psychiatric:unable to assess . Neurologic: no seizure no involuntary movements         Lab Data:   Basic Metabolic Panel: Recent Labs  Lab 12/03/20 0558 12/07/20 0447  NA 138 136  K 4.5 4.4  CL 94* 91*  CO2 33* 32  GLUCOSE 149* 148*  BUN 35* 30*  CREATININE 0.56* 0.64  CALCIUM 9.4 9.4  MG 1.9 2.0    ABG: No results for input(s): PHART, PCO2ART, PO2ART, HCO3, O2SAT in the last 168 hours.  Liver Function Tests: No results for input(s): AST, ALT, ALKPHOS, BILITOT, PROT, ALBUMIN in the last 168 hours. No results for input(s): LIPASE, AMYLASE in the last 168 hours. No results for input(s): AMMONIA in the last 168 hours.  CBC: Recent Labs  Lab 12/03/20 0558 12/07/20 0447  WBC 10.3 8.5  HGB 9.2* 9.7*  HCT 28.5* 29.7*  MCV 99.7 98.7  PLT 225 369    Cardiac Enzymes: No results for input(s): CKTOTAL, CKMB, CKMBINDEX, TROPONINI in the last 168 hours.  BNP (last 3 results) No results for input(s):  BNP in the last 8760 hours.  ProBNP (last 3 results) No results for input(s): PROBNP in the last 8760 hours.  Radiological Exams: No results found.  Assessment/Plan Active Problems:   Acute on chronic respiratory failure with hypoxia (HCC)   COVID-19 virus infection   Tracheostomy status (HCC)   Severe sepsis (HCC)   1. Acute on chronic respiratory failure hypoxia patient remains on pressure control remains on 40% FiO2 will continue with full support 2. COVID-19 virus infection recovery 3. Severe sepsis resolved 4. Tracheostomy doing fine we will continue present management   I have personally seen and evaluated the patient, evaluated laboratory and imaging results, formulated the assessment and plan and placed orders. The Patient requires high complexity decision making with multiple systems involvement.  Rounds were done with the Respiratory Therapy Director and Staff therapists and discussed with nursing staff also.  Yevonne Pax, MD [facility] Pulmonary Critical Care Medicine Sleep Medicine

## 2020-12-10 DIAGNOSIS — U071 COVID-19: Secondary | ICD-10-CM | POA: Diagnosis not present

## 2020-12-10 DIAGNOSIS — Z930 Tracheostomy status: Secondary | ICD-10-CM | POA: Diagnosis not present

## 2020-12-10 DIAGNOSIS — J9621 Acute and chronic respiratory failure with hypoxia: Secondary | ICD-10-CM | POA: Diagnosis not present

## 2020-12-10 DIAGNOSIS — A419 Sepsis, unspecified organism: Secondary | ICD-10-CM | POA: Diagnosis not present

## 2020-12-10 LAB — BASIC METABOLIC PANEL
Anion gap: 14 (ref 5–15)
BUN: 34 mg/dL — ABNORMAL HIGH (ref 6–20)
CO2: 30 mmol/L (ref 22–32)
Calcium: 9.6 mg/dL (ref 8.9–10.3)
Chloride: 87 mmol/L — ABNORMAL LOW (ref 98–111)
Creatinine, Ser: 0.8 mg/dL (ref 0.61–1.24)
GFR, Estimated: >60 mL/min (ref >60)
Glucose, Bld: 295 mg/dL — ABNORMAL HIGH (ref 70–99)
Potassium: 5.7 mmol/L — ABNORMAL HIGH (ref 3.5–5.1)
Sodium: 131 mmol/L — ABNORMAL LOW (ref 135–145)

## 2020-12-10 LAB — CBC
HCT: 31.2 % — ABNORMAL LOW (ref 39.0–52.0)
Hemoglobin: 10.9 g/dL — ABNORMAL LOW (ref 13.0–17.0)
MCH: 32.9 pg (ref 26.0–34.0)
MCHC: 34.9 g/dL (ref 30.0–36.0)
MCV: 94.3 fL (ref 80.0–100.0)
Platelets: 469 10*3/uL — ABNORMAL HIGH (ref 150–400)
RBC: 3.31 MIL/uL — ABNORMAL LOW (ref 4.22–5.81)
RDW: 14.6 % (ref 11.5–15.5)
WBC: 13.3 10*3/uL — ABNORMAL HIGH (ref 4.0–10.5)
nRBC: 0 % (ref 0.0–0.2)

## 2020-12-10 NOTE — Progress Notes (Signed)
Pulmonary Critical Care Medicine [facility] [place]   PULMONARY CRITICAL CARE SERVICE  PROGRESS NOTE  Date of Service: 12/10/2020  Lawrence Freeman  GUR:[id number]  DOB: [birth year]   DOA: 11/23/2020  Referring Physician: Carron Curie, MD  HPI: Lawrence Freeman is a 49 y.o. male seen for follow up of Acute on Chronic Respiratory Failure.  Patient currently is on pressure support has been on 40% FiO2 was able to 5 hours yesterday on pressure support  Medications: Reviewed on Rounds  Physical Exam:  Vitals: Temperature is 96.0 pulse 109 respiratory 22 blood pressure is 96/64 saturations 99%  Ventilator Settings on pressure support FiO2 40% pressure of 14/5  . General: Comfortable at this time . Eyes: Grossly normal lids, irises & conjunctiva . ENT: grossly tongue is normal . Neck: no obvious mass . Cardiovascular: S1 S2 normal no gallop . Respiratory: No rhonchi no rales are noted at this time. . Abdomen: soft . Skin: no rash seen on limited exam . Musculoskeletal: not rigid . Psychiatric:unable to assess . Neurologic: no seizure no involuntary movements         Lab Data:   Basic Metabolic Panel: Recent Labs  Lab 12/07/20 0447 12/10/20 0404  NA 136 131*  K 4.4 5.7*  CL 91* 87*  CO2 32 30  GLUCOSE 148* 295*  BUN 30* 34*  CREATININE 0.64 0.80  CALCIUM 9.4 9.6  MG 2.0  --     ABG: No results for input(s): PHART, PCO2ART, PO2ART, HCO3, O2SAT in the last 168 hours.  Liver Function Tests: No results for input(s): AST, ALT, ALKPHOS, BILITOT, PROT, ALBUMIN in the last 168 hours. No results for input(s): LIPASE, AMYLASE in the last 168 hours. No results for input(s): AMMONIA in the last 168 hours.  CBC: Recent Labs  Lab 12/07/20 0447 12/10/20 0404  WBC 8.5 13.3*  HGB 9.7* 10.9*  HCT 29.7* 31.2*  MCV 98.7 94.3  PLT 369 469*    Cardiac Enzymes: No results for input(s): CKTOTAL, CKMB, CKMBINDEX, TROPONINI in the last 168 hours.  BNP  (last 3 results) No results for input(s): BNP in the last 8760 hours.  ProBNP (last 3 results) No results for input(s): PROBNP in the last 8760 hours.  Radiological Exams: DG CHEST PORT 1 VIEW  Result Date: 12/09/2020 CLINICAL DATA:  Pleural effusion. EXAM: PORTABLE CHEST 1 VIEW COMPARISON:  December 04, 2020. FINDINGS: The heart size and mediastinal contours are within normal limits. Tracheostomy tube is unchanged in position. No pneumothorax or pleural effusion is noted. Stable patchy airspace opacities are noted concerning for multifocal pneumonia. The visualized skeletal structures are unremarkable. IMPRESSION: Stable patchy airspace opacities are noted concerning for multifocal pneumonia. Electronically Signed   By: Lupita Raider M.D.   On: 12/09/2020 11:30    Assessment/Plan Active Problems:   Acute on chronic respiratory failure with hypoxia (HCC)   COVID-19 virus infection   Tracheostomy status (HCC)   Severe sepsis (HCC)   1. Acute on chronic respiratory failure with hypoxia we will continue with weaning on pressure support trial to do 10 to 12 hours 2. COVID-19 virus infection recovery 3. Tracheostomy remains in place 4. Severe sepsis treated resolved   I have personally seen and evaluated the patient, evaluated laboratory and imaging results, formulated the assessment and plan and placed orders. The Patient requires high complexity decision making with multiple systems involvement.  Rounds were done with the Respiratory Therapy Director and Staff therapists and discussed with nursing staff also.  Allyne Gee, MD [facility] Pulmonary Critical Care Medicine Sleep Medicine

## 2020-12-11 DIAGNOSIS — J9621 Acute and chronic respiratory failure with hypoxia: Secondary | ICD-10-CM | POA: Diagnosis not present

## 2020-12-11 DIAGNOSIS — A419 Sepsis, unspecified organism: Secondary | ICD-10-CM | POA: Diagnosis not present

## 2020-12-11 DIAGNOSIS — U071 COVID-19: Secondary | ICD-10-CM | POA: Diagnosis not present

## 2020-12-11 DIAGNOSIS — Z930 Tracheostomy status: Secondary | ICD-10-CM | POA: Diagnosis not present

## 2020-12-11 LAB — POTASSIUM: Potassium: 4.2 mmol/L (ref 3.5–5.1)

## 2020-12-11 NOTE — Progress Notes (Signed)
Pulmonary Critical Care Medicine [facility] [place]   PULMONARY CRITICAL CARE SERVICE  PROGRESS NOTE  Date of Service: 12/11/2020  Lawrence Freeman  [id number]:[id number]  DOB: 13-May-1971   DOA: 11/23/2020  Referring Physician: Carron Curie, MD  HPI: Lawrence Freeman is a 49 y.o. male seen for follow up of Acute on Chronic Respiratory Failure.  Patient still has significant issues with anxiety.  He does fine on pressure support when he is asleep however once he is awake he has more difficulty.  This morning he was asleep he was on pressure support 40% FiO2 on a pressure of 12/5  Medications: Reviewed on Rounds  Physical Exam:  Vitals: Temperature is 96.2 pulse 102 respiratory 24 blood pressure is 119/80 saturations 100%  Ventilator Settings on pressure support FiO2 40% pressure 12/5  . General: Comfortable at this time . Eyes: Grossly normal lids, irises & conjunctiva . ENT: grossly tongue is normal . Neck: no obvious mass . Cardiovascular: S1 S2 normal no gallop . Respiratory: No rhonchi coarse breath sounds are noted . Abdomen: soft . Skin: no rash seen on limited exam . Musculoskeletal: not rigid . Psychiatric:unable to assess . Neurologic: no seizure no involuntary movements         Lab Data:   Basic Metabolic Panel: Recent Labs  Lab 12/07/20 0447 12/10/20 0404  NA 136 131*  K 4.4 5.7*  CL 91* 87*  CO2 32 30  GLUCOSE 148* 295*  BUN 30* 34*  CREATININE 0.64 0.80  CALCIUM 9.4 9.6  MG 2.0  --     ABG: No results for input(s): PHART, PCO2ART, PO2ART, HCO3, O2SAT in the last 168 hours.  Liver Function Tests: No results for input(s): AST, ALT, ALKPHOS, BILITOT, PROT, ALBUMIN in the last 168 hours. No results for input(s): LIPASE, AMYLASE in the last 168 hours. No results for input(s): AMMONIA in the last 168 hours.  CBC: Recent Labs  Lab 12/07/20 0447 12/10/20 0404  WBC 8.5 13.3*  HGB 9.7* 10.9*  HCT 29.7* 31.2*  MCV 98.7 94.3  PLT  369 469*    Cardiac Enzymes: No results for input(s): CKTOTAL, CKMB, CKMBINDEX, TROPONINI in the last 168 hours.  BNP (last 3 results) No results for input(s): BNP in the last 8760 hours.  ProBNP (last 3 results) No results for input(s): PROBNP in the last 8760 hours.  Radiological Exams: DG CHEST PORT 1 VIEW  Result Date: 12/09/2020 CLINICAL DATA:  Pleural effusion. EXAM: PORTABLE CHEST 1 VIEW COMPARISON:  December 04, 2020. FINDINGS: The heart size and mediastinal contours are within normal limits. Tracheostomy tube is unchanged in position. No pneumothorax or pleural effusion is noted. Stable patchy airspace opacities are noted concerning for multifocal pneumonia. The visualized skeletal structures are unremarkable. IMPRESSION: Stable patchy airspace opacities are noted concerning for multifocal pneumonia. Electronically Signed   By: Lupita Raider M.D.   On: 12/09/2020 11:30    Assessment/Plan Active Problems:   Acute on chronic respiratory failure with hypoxia (HCC)   COVID-19 virus infection   Tracheostomy status (HCC)   Severe sepsis (HCC)   1. Acute on chronic respiratory failure hypoxia we will continue with the weaning on pressure support anxiety control continue supportive care and follow along 2. COVID-19 virus infection in recovery 3. Tracheostomy remains in place 4. Severe sepsis resolved   I have personally seen and evaluated the patient, evaluated laboratory and imaging results, formulated the assessment and plan and placed orders. The Patient requires high complexity decision  making with multiple systems involvement.  Rounds were done with the Respiratory Therapy Director and Staff therapists and discussed with nursing staff also.  Allyne Gee, MD [facility] Pulmonary Critical Care Medicine Sleep Medicine

## 2020-12-12 DIAGNOSIS — Z930 Tracheostomy status: Secondary | ICD-10-CM | POA: Diagnosis not present

## 2020-12-12 DIAGNOSIS — A419 Sepsis, unspecified organism: Secondary | ICD-10-CM | POA: Diagnosis not present

## 2020-12-12 DIAGNOSIS — J9621 Acute and chronic respiratory failure with hypoxia: Secondary | ICD-10-CM | POA: Diagnosis not present

## 2020-12-12 DIAGNOSIS — U071 COVID-19: Secondary | ICD-10-CM | POA: Diagnosis not present

## 2020-12-12 LAB — CULTURE, RESPIRATORY W GRAM STAIN

## 2020-12-12 NOTE — Progress Notes (Signed)
Pulmonary Critical Care Medicine [facility] [place]   PULMONARY CRITICAL CARE SERVICE  PROGRESS NOTE  Date of Service: 12/12/2020  Lawrence Freeman  VVO:[id number]  DOB: Nov 08, 1971   DOA: 11/23/2020  Referring Physician: Carron Curie, MD  HPI: Lawrence Freeman is a 49 y.o. male seen for follow up of Acute on Chronic Respiratory Failure.  Patient currently is on pressure control mode has been on 40% FiO2 appears to be comfortable  Medications: Reviewed on Rounds  Physical Exam:  Vitals: Temperature is 96.5 pulse 94 respiratory 21 blood pressure is 110/59 saturations 100%  Ventilator Settings pressure control FiO2 is 40% IP 22 PEEP of 5  . General: Comfortable at this time . Eyes: Grossly normal lids, irises & conjunctiva . ENT: grossly tongue is normal . Neck: no obvious mass . Cardiovascular: S1 S2 normal no gallop . Respiratory: Scattered rhonchi expansion is equal . Abdomen: soft . Skin: no rash seen on limited exam . Musculoskeletal: not rigid . Psychiatric:unable to assess . Neurologic: no seizure no involuntary movements         Lab Data:   Basic Metabolic Panel: Recent Labs  Lab 12/07/20 0447 12/10/20 0404 12/11/20 1536  NA 136 131*  --   K 4.4 5.7* 4.2  CL 91* 87*  --   CO2 32 30  --   GLUCOSE 148* 295*  --   BUN 30* 34*  --   CREATININE 0.64 0.80  --   CALCIUM 9.4 9.6  --   MG 2.0  --   --     ABG: No results for input(s): PHART, PCO2ART, PO2ART, HCO3, O2SAT in the last 168 hours.  Liver Function Tests: No results for input(s): AST, ALT, ALKPHOS, BILITOT, PROT, ALBUMIN in the last 168 hours. No results for input(s): LIPASE, AMYLASE in the last 168 hours. No results for input(s): AMMONIA in the last 168 hours.  CBC: Recent Labs  Lab 12/07/20 0447 12/10/20 0404  WBC 8.5 13.3*  HGB 9.7* 10.9*  HCT 29.7* 31.2*  MCV 98.7 94.3  PLT 369 469*    Cardiac Enzymes: No results for input(s): CKTOTAL, CKMB, CKMBINDEX,  TROPONINI in the last 168 hours.  BNP (last 3 results) No results for input(s): BNP in the last 8760 hours.  ProBNP (last 3 results) No results for input(s): PROBNP in the last 8760 hours.  Radiological Exams: No results found.  Assessment/Plan Active Problems:   Acute on chronic respiratory failure with hypoxia (HCC)   COVID-19 virus infection   Tracheostomy status (HCC)   Severe sepsis (HCC)   1. Acute on chronic respiratory failure hypoxia we will continue pressure control mode on 40% FiO2 2. COVID-19 virus infection recovery 3. Tracheostomy remains in place 4. Severe sepsis treated and resolved   I have personally seen and evaluated the patient, evaluated laboratory and imaging results, formulated the assessment and plan and placed orders. The Patient requires high complexity decision making with multiple systems involvement.  Rounds were done with the Respiratory Therapy Director and Staff therapists and discussed with nursing staff also.  Yevonne Pax, MD [facility] Pulmonary Critical Care Medicine Sleep Medicine

## 2020-12-13 DIAGNOSIS — A419 Sepsis, unspecified organism: Secondary | ICD-10-CM | POA: Diagnosis not present

## 2020-12-13 DIAGNOSIS — Z930 Tracheostomy status: Secondary | ICD-10-CM | POA: Diagnosis not present

## 2020-12-13 DIAGNOSIS — U071 COVID-19: Secondary | ICD-10-CM | POA: Diagnosis not present

## 2020-12-13 DIAGNOSIS — J9621 Acute and chronic respiratory failure with hypoxia: Secondary | ICD-10-CM | POA: Diagnosis not present

## 2020-12-13 NOTE — Progress Notes (Signed)
Pulmonary Critical Care Medicine [facility] [place]   PULMONARY CRITICAL CARE SERVICE  PROGRESS NOTE  Date of Service: 12/13/2020  Banjamin Stovall  ALP:[id number]  DOB: 10/02/1971   DOA: 11/23/2020  Referring Physician: Carron Curie, MD  HPI: Mattia Liford is a 49 y.o. male seen for follow up of Acute on Chronic Respiratory Failure.  Patient was able to do about 7 hours of pressure support right now is on pressure support of 12/5  Medications: Reviewed on Rounds  Physical Exam:  Vitals: Temperature 96.4 pulse 103 respiratory rate is 30 blood pressure is 114/73 saturations 100%  Ventilator Settings on pressure support FiO2 40% pressure 12  . General: Comfortable at this time . Eyes: Grossly normal lids, irises & conjunctiva . ENT: grossly tongue is normal . Neck: no obvious mass . Cardiovascular: S1 S2 normal no gallop . Respiratory: No rhonchi no rales noted at this time . Abdomen: soft . Skin: no rash seen on limited exam . Musculoskeletal: not rigid . Psychiatric:unable to assess . Neurologic: no seizure no involuntary movements         Lab Data:   Basic Metabolic Panel: Recent Labs  Lab 12/07/20 0447 12/10/20 0404 12/11/20 1536  NA 136 131*  --   K 4.4 5.7* 4.2  CL 91* 87*  --   CO2 32 30  --   GLUCOSE 148* 295*  --   BUN 30* 34*  --   CREATININE 0.64 0.80  --   CALCIUM 9.4 9.6  --   MG 2.0  --   --     ABG: No results for input(s): PHART, PCO2ART, PO2ART, HCO3, O2SAT in the last 168 hours.  Liver Function Tests: No results for input(s): AST, ALT, ALKPHOS, BILITOT, PROT, ALBUMIN in the last 168 hours. No results for input(s): LIPASE, AMYLASE in the last 168 hours. No results for input(s): AMMONIA in the last 168 hours.  CBC: Recent Labs  Lab 12/07/20 0447 12/10/20 0404  WBC 8.5 13.3*  HGB 9.7* 10.9*  HCT 29.7* 31.2*  MCV 98.7 94.3  PLT 369 469*    Cardiac Enzymes: No results for input(s): CKTOTAL, CKMB,  CKMBINDEX, TROPONINI in the last 168 hours.  BNP (last 3 results) No results for input(s): BNP in the last 8760 hours.  ProBNP (last 3 results) No results for input(s): PROBNP in the last 8760 hours.  Radiological Exams: No results found.  Assessment/Plan Active Problems:   Acute on chronic respiratory failure with hypoxia (HCC)   COVID-19 virus infection   Tracheostomy status (HCC)   Severe sepsis (HCC)   1. Acute on chronic respiratory failure hypoxia plan is to continue with the pressure support weaning as ordered 2. COVID-19 virus infection recovery 3. Tracheostomy right now remains in place 4. Severe sepsis resolved   I have personally seen and evaluated the patient, evaluated laboratory and imaging results, formulated the assessment and plan and placed orders. The Patient requires high complexity decision making with multiple systems involvement.  Rounds were done with the Respiratory Therapy Director and Staff therapists and discussed with nursing staff also.  Yevonne Pax, MD [facility] Pulmonary Critical Care Medicine Sleep Medicine

## 2020-12-14 ENCOUNTER — Other Ambulatory Visit (HOSPITAL_COMMUNITY): Payer: Self-pay

## 2020-12-14 DIAGNOSIS — A419 Sepsis, unspecified organism: Secondary | ICD-10-CM | POA: Diagnosis not present

## 2020-12-14 DIAGNOSIS — Z930 Tracheostomy status: Secondary | ICD-10-CM | POA: Diagnosis not present

## 2020-12-14 DIAGNOSIS — J9621 Acute and chronic respiratory failure with hypoxia: Secondary | ICD-10-CM | POA: Diagnosis not present

## 2020-12-14 DIAGNOSIS — U071 COVID-19: Secondary | ICD-10-CM | POA: Diagnosis not present

## 2020-12-14 LAB — BASIC METABOLIC PANEL
Anion gap: 11 (ref 5–15)
BUN: 26 mg/dL — ABNORMAL HIGH (ref 6–20)
CO2: 32 mmol/L (ref 22–32)
Calcium: 9.1 mg/dL (ref 8.9–10.3)
Chloride: 94 mmol/L — ABNORMAL LOW (ref 98–111)
Creatinine, Ser: 0.54 mg/dL — ABNORMAL LOW (ref 0.61–1.24)
GFR, Estimated: >60 mL/min (ref >60)
Glucose, Bld: 132 mg/dL — ABNORMAL HIGH (ref 70–99)
Potassium: 3.9 mmol/L (ref 3.5–5.1)
Sodium: 137 mmol/L (ref 135–145)

## 2020-12-14 LAB — CBC
HCT: 28.7 % — ABNORMAL LOW (ref 39.0–52.0)
Hemoglobin: 9.4 g/dL — ABNORMAL LOW (ref 13.0–17.0)
MCH: 32.2 pg (ref 26.0–34.0)
MCHC: 32.8 g/dL (ref 30.0–36.0)
MCV: 98.3 fL (ref 80.0–100.0)
Platelets: 438 10*3/uL — ABNORMAL HIGH (ref 150–400)
RBC: 2.92 MIL/uL — ABNORMAL LOW (ref 4.22–5.81)
RDW: 14.9 % (ref 11.5–15.5)
WBC: 14.5 10*3/uL — ABNORMAL HIGH (ref 4.0–10.5)
nRBC: 0.1 % (ref 0.0–0.2)

## 2020-12-14 NOTE — Progress Notes (Signed)
Pulmonary Critical Care Medicine [facility] [place]   PULMONARY CRITICAL CARE SERVICE  PROGRESS NOTE  Date of Service: 12/14/2020  Lawrence Freeman  [id number]  DOB: 12-06-1971   DOA: 11/23/2020  Referring Physician: Carron Curie, MD  HPI: Lawrence Freeman is a 49 y.o. male seen for follow up of Acute on Chronic Respiratory Failure.  Patient was only able to do 1 hour on wean yesterday we will try to do it again today  Medications: Reviewed on Rounds  Physical Exam:  Vitals: Temperature is 98 point pulse 105 respiratory 24 blood pressure is 120/67 saturations 100%  Ventilator Settings on pressure support FiO2 35% pressure 12/5  . General: Comfortable at this time . Eyes: Grossly normal lids, irises & conjunctiva . ENT: grossly tongue is normal . Neck: no obvious mass . Cardiovascular: S1 S2 normal no gallop . Respiratory: No rhonchi no rales are noted . Abdomen: soft . Skin: no rash seen on limited exam . Musculoskeletal: not rigid . Psychiatric:unable to assess . Neurologic: no seizure no involuntary movements         Lab Data:   Basic Metabolic Panel: Recent Labs  Lab 12/10/20 0404 12/11/20 1536 12/14/20 0321  NA 131*  --  137  K 5.7* 4.2 3.9  CL 87*  --  94*  CO2 30  --  32  GLUCOSE 295*  --  132*  BUN 34*  --  26*  CREATININE 0.80  --  0.54*  CALCIUM 9.6  --  9.1    ABG: No results for input(s): PHART, PCO2ART, PO2ART, HCO3, O2SAT in the last 168 hours.  Liver Function Tests: No results for input(s): AST, ALT, ALKPHOS, BILITOT, PROT, ALBUMIN in the last 168 hours. No results for input(s): LIPASE, AMYLASE in the last 168 hours. No results for input(s): AMMONIA in the last 168 hours.  CBC: Recent Labs  Lab 12/10/20 0404 12/14/20 0321  WBC 13.3* 14.5*  HGB 10.9* 9.4*  HCT 31.2* 28.7*  MCV 94.3 98.3  PLT 469* 438*    Cardiac Enzymes: No results for input(s): CKTOTAL, CKMB, CKMBINDEX, TROPONINI in the last 168  hours.  BNP (last 3 results) No results for input(s): BNP in the last 8760 hours.  ProBNP (last 3 results) No results for input(s): PROBNP in the last 8760 hours.  Radiological Exams: DG CHEST PORT 1 VIEW  Result Date: 12/14/2020 CLINICAL DATA:  Respiratory failure. EXAM: PORTABLE CHEST 1 VIEW COMPARISON:  December 09, 2020. FINDINGS: Cardiomediastinal silhouette is within normal limits. Similar positioning of a midline tracheostomy tube. Similar appearance of patchy interstitial and airspace opacities. Rounded opacity projecting over the right lung apex. No visible pleural effusions or pneumothorax. No acute osseous abnormality. IMPRESSION: 1. Similar patchy interstitial airspace opacities, concerning for multifocal pneumonia and/or aspiration. 2. Rounded opacity projecting over the right lung apex is favored external to the patient given this was not present on recent chest radiograph from December 09, 2020. However, recommend attention on short interval follow-up. Electronically Signed   By: Lawrence Harts MD   On: 12/14/2020 07:32    Assessment/Plan Active Problems:   Acute on chronic respiratory failure with hypoxia (HCC)   COVID-19 virus infection   Tracheostomy status (HCC)   Severe sepsis (HCC)   1. Acute on chronic respiratory failure hypoxia we will continue currently on 35% FiO2 patient is on pressure of 12/5.  Spoke with respiratory therapy on rounds tried to wean the patient on high flow if possible. 2. The COVID-19  virus infection recovery 3. Tracheostomy remains in place 4. Severe sepsis resolved   I have personally seen and evaluated the patient, evaluated laboratory and imaging results, formulated the assessment and plan and placed orders. The Patient requires high complexity decision making with multiple systems involvement.  Rounds were done with the Respiratory Therapy Director and Staff therapists and discussed with nursing staff also.  Lawrence Pax, MD  [facility] Pulmonary Critical Care Medicine Sleep Medicine

## 2020-12-15 DIAGNOSIS — A419 Sepsis, unspecified organism: Secondary | ICD-10-CM | POA: Diagnosis not present

## 2020-12-15 DIAGNOSIS — J9621 Acute and chronic respiratory failure with hypoxia: Secondary | ICD-10-CM | POA: Diagnosis not present

## 2020-12-15 DIAGNOSIS — U071 COVID-19: Secondary | ICD-10-CM | POA: Diagnosis not present

## 2020-12-15 DIAGNOSIS — Z930 Tracheostomy status: Secondary | ICD-10-CM | POA: Diagnosis not present

## 2020-12-15 NOTE — Progress Notes (Signed)
Pulmonary Critical Care Medicine [facility] [place]   PULMONARY CRITICAL CARE SERVICE  PROGRESS NOTE  Date of Service: 12/15/2020  Florencio Hollibaugh  CWC:[id number]  DOB: [birth year]   DOA: 11/23/2020  Referring Physician: Carron Curie, MD  HPI: Fernado Brigante is a 49 y.o. male seen for follow up of Acute on Chronic Respiratory Failure.  Patient currently is on full support on pressure control mode saturations are 99%.  Has been on 40% FiO2  Medications: Reviewed on Rounds  Physical Exam:  Vitals: Temperature is 99.1 pulse 113 respiratory 28 blood pressure is 99/62 saturations 99  Ventilator Settings on pressure control FiO2 40% IP 22 PEEP 5  . General: Comfortable at this time . Eyes: Grossly normal lids, irises & conjunctiva . ENT: grossly tongue is normal . Neck: no obvious mass . Cardiovascular: S1 S2 normal no gallop . Respiratory: No rhonchi no rales noted . Abdomen: soft . Skin: no rash seen on limited exam . Musculoskeletal: not rigid . Psychiatric:unable to assess . Neurologic: no seizure no involuntary movements         Lab Data:   Basic Metabolic Panel: Recent Labs  Lab 12/10/20 0404 12/11/20 1536 12/14/20 0321  NA 131*  --  137  K 5.7* 4.2 3.9  CL 87*  --  94*  CO2 30  --  32  GLUCOSE 295*  --  132*  BUN 34*  --  26*  CREATININE 0.80  --  0.54*  CALCIUM 9.6  --  9.1    ABG: No results for input(s): PHART, PCO2ART, PO2ART, HCO3, O2SAT in the last 168 hours.  Liver Function Tests: No results for input(s): AST, ALT, ALKPHOS, BILITOT, PROT, ALBUMIN in the last 168 hours. No results for input(s): LIPASE, AMYLASE in the last 168 hours. No results for input(s): AMMONIA in the last 168 hours.  CBC: Recent Labs  Lab 12/10/20 0404 12/14/20 0321  WBC 13.3* 14.5*  HGB 10.9* 9.4*  HCT 31.2* 28.7*  MCV 94.3 98.3  PLT 469* 438*    Cardiac Enzymes: No results for input(s): CKTOTAL, CKMB, CKMBINDEX, TROPONINI in the last 168  hours.  BNP (last 3 results) No results for input(s): BNP in the last 8760 hours.  ProBNP (last 3 results) No results for input(s): PROBNP in the last 8760 hours.  Radiological Exams: DG CHEST PORT 1 VIEW  Result Date: 12/14/2020 CLINICAL DATA:  Respiratory failure. EXAM: PORTABLE CHEST 1 VIEW COMPARISON:  December 09, 2020. FINDINGS: Cardiomediastinal silhouette is within normal limits. Similar positioning of a midline tracheostomy tube. Similar appearance of patchy interstitial and airspace opacities. Rounded opacity projecting over the right lung apex. No visible pleural effusions or pneumothorax. No acute osseous abnormality. IMPRESSION: 1. Similar patchy interstitial airspace opacities, concerning for multifocal pneumonia and/or aspiration. 2. Rounded opacity projecting over the right lung apex is favored external to the patient given this was not present on recent chest radiograph from December 09, 2020. However, recommend attention on short interval follow-up. Electronically Signed   By: Feliberto Harts MD   On: 12/14/2020 07:32    Assessment/Plan Active Problems:   Acute on chronic respiratory failure with hypoxia (HCC)   COVID-19 virus infection   Tracheostomy status (HCC)   Severe sepsis (HCC)   1. Acute on chronic respiratory failure hypoxia we will continue with pressure control patient did not really tolerate the attempt at high flow yesterday 2. COVID-19 virus infection in recovery 3. Severe sepsis resolved 4. Tracheostomy remains in place  I have personally seen and evaluated the patient, evaluated laboratory and imaging results, formulated the assessment and plan and placed orders. The Patient requires high complexity decision making with multiple systems involvement.  Rounds were done with the Respiratory Therapy Director and Staff therapists and discussed with nursing staff also.  Allyne Gee, MD [facility] Pulmonary Critical Care Medicine Sleep Medicine

## 2020-12-15 NOTE — Consult Note (Signed)
Infectious Disease Consultation   Lawrence Freeman [facility]  WIO:[id number]  DOB: 14-Mar-1971  DOA: 11/23/2020  Requesting physician: Dr. Sharyon Medicus  Reason for consultation: Antibiotic recommendations   History of Present Illness: Lawrence Freeman is an 49 y.o. male who has COVID-19 infection on mechanical ventilation was [facility] and was doing fairly well until he developed a mucous plug and decompensated quickly with worsening respiratory failure for which she was transferred to [facility] ICU.  While at the acute hospital he underwent bronchoscopy which showed thick tenacious reddish mucoid material adherent to the inner wall of the trach.  It appeared that the trach had migrated down to the level of the carina.  Symptoms improved after removing the material and underneath the trach tube was padded and pulled back from the carina.  [facility] was then transferred back to [facility] and admitted here on 11/23/2020.  He received treatment with multiple antibiotics for secondary bacterial pneumonia.  As soon as antibiotics are stopped he starts having worsening with leukocytosis, fever.  He currently has a trach in place.  On PSV trial.  At the time of my examination at this time he is a little lethargic but arousable.   Review of Systems:  Unable to obtain at this time  Past Medical History: Past Medical History:  Diagnosis Date  . Acute infective tracheobronchitis   . Acute on chronic respiratory failure with hypoxia (HCC)   . Anxiety   . CAD (coronary artery disease)   . COVID-19 virus infection   . Gout   . Hyperlipidemia   . Hypertension   . Pneumonia due to COVID-19 virus   . Tracheostomy status (HCC) 09/20/2020    Past Surgical History: Past Surgical History:  Procedure Laterality Date  . IR GASTROSTOMY TUBE MOD SED  10/19/2020     Allergies:   Allergies  Allergen Reactions  . Penicillins Anaphylaxis    Tolerated cefepime and  ceftriaxone in 10/2020  . Precedex [Dexmedetomidine Hcl In Nacl] Other (See Comments)    Severe bradycardia     Social History:  reports that he has never smoked. He has never used smokeless tobacco. He reports previous alcohol use. He reports that he does not use drugs.   Family History: History of hypertension, heart disease   Physical Exam: Vitals: Temperature 98.4, pulse 93, respiratory rate 25, blood pressure 124/62, pulse oximetry 98% Constitutional: Ill-appearing male, lethargic but arousable Head: Atraumatic, normocephalic Eyes: PERLA, EOMI ENMT: external ears and nose appear normal, normal hearing, Lips appears normal, moist oral mucosa  Neck: trach CVS: S1-S2  Respiratory: Rhonchi, no wheezing Abdomen: soft nontender, nondistended, normal bowel sounds Musculoskeletal: Mild edema Neuro: He is lethargic but arousable, has severe debility with generalized weakness Psych: stable mood and affect Skin: no rashes  Data reviewed:  I have personally reviewed following labs and imaging studies Labs:  CBC: Recent Labs  Lab 12/10/20 0404 12/14/20 0321  WBC 13.3* 14.5*  HGB 10.9* 9.4*  HCT 31.2* 28.7*  MCV 94.3 98.3  PLT 469* 438*    Basic Metabolic Panel: Recent Labs  Lab 12/10/20 0404 12/11/20 1536 12/14/20 0321  NA 131*  --  137  K 5.7* 4.2 3.9  CL 87*  --  94*  CO2 30  --  32  GLUCOSE 295*  --  132*  BUN 34*  --  26*  CREATININE 0.80  --  0.54*  CALCIUM 9.6  --  9.1   GFR CrCl cannot be calculated (Unknown ideal weight.). Liver Function Tests: No results for input(s): AST, ALT, ALKPHOS, BILITOT, PROT, ALBUMIN in the last 168 hours. No results for input(s): LIPASE, AMYLASE in the last 168 hours. No results for input(s): AMMONIA in the last 168 hours. Coagulation profile No results for input(s): INR, PROTIME in the last 168 hours.  Cardiac Enzymes: No results for input(s): CKTOTAL, CKMB, CKMBINDEX, TROPONINI in the last 168 hours. BNP: Invalid  input(s): POCBNP CBG: No results for input(s): GLUCAP in the last 168 hours. D-Dimer No results for input(s): DDIMER in the last 72 hours. Hgb A1c No results for input(s): HGBA1C in the last 72 hours. Lipid Profile No results for input(s): CHOL, HDL, LDLCALC, TRIG, CHOLHDL, LDLDIRECT in the last 72 hours. Thyroid function studies No results for input(s): TSH, T4TOTAL, T3FREE, THYROIDAB in the last 72 hours.  Invalid input(s): FREET3 Anemia work up No results for input(s): VITAMINB12, FOLATE, FERRITIN, TIBC, IRON, RETICCTPCT in the last 72 hours. Urinalysis    Component Value Date/Time   COLORURINE YELLOW 11/21/2020 1626   APPEARANCEUR HAZY (A) 11/21/2020 1626   LABSPEC 1.023 11/21/2020 1626   PHURINE 5.0 11/21/2020 1626   GLUCOSEU NEGATIVE 11/21/2020 1626   HGBUR NEGATIVE 11/21/2020 1626   BILIRUBINUR NEGATIVE 11/21/2020 1626   KETONESUR NEGATIVE 11/21/2020 1626   PROTEINUR 30 (A) 11/21/2020 1626   NITRITE NEGATIVE 11/21/2020 1626   LEUKOCYTESUR NEGATIVE 11/21/2020 1626   Sepsis Labs Invalid input(s): PROCALCITONIN,  WBC,  LACTICIDVEN Microbiology Recent Results (from the past 240 hour(s))  Culture, respiratory (non-expectorated)     Status: None   Collection Time: 12/09/20  6:47 PM   Specimen: Tracheal Aspirate; Respiratory  Result Value Ref Range Status   Specimen Description TRACHEAL ASPIRATE  Final   Special Requests NONE  Final   Gram Stain   Final    FEW WBC PRESENT, PREDOMINANTLY PMN FEW YEAST Performed at [facility], [address], [place], [place] [address]    Culture RARE KLEBSIELLA PNEUMONIAE  Final   Report Status 12/12/2020 FINAL  Final   Organism ID, Bacteria KLEBSIELLA PNEUMONIAE  Final      Susceptibility   Klebsiella pneumoniae - MIC*    AMPICILLIN >=32 RESISTANT Resistant     CEFAZOLIN <=4 SENSITIVE Sensitive     CEFEPIME <=0.12 SENSITIVE Sensitive     CEFTAZIDIME <=1 SENSITIVE Sensitive     CEFTRIAXONE 1 SENSITIVE Sensitive      CIPROFLOXACIN >=4 RESISTANT Resistant     GENTAMICIN <=1 SENSITIVE Sensitive     IMIPENEM 0.5 SENSITIVE Sensitive     TRIMETH/SULFA <=20 SENSITIVE Sensitive     AMPICILLIN/SULBACTAM >=32 RESISTANT Resistant     PIP/TAZO 16 SENSITIVE Sensitive     * RARE KLEBSIELLA PNEUMONIAE     Inpatient Medications:   Please see MAR   Radiological Exams on Admission: DG CHEST PORT 1 VIEW  Result Date: 12/14/2020 CLINICAL DATA:  Respiratory failure. EXAM: PORTABLE CHEST 1 VIEW COMPARISON:  December 09, 2020. FINDINGS: Cardiomediastinal silhouette is within normal limits. Similar positioning of a midline tracheostomy tube. Similar appearance of patchy interstitial and airspace opacities. Rounded opacity projecting over the right lung apex. No visible pleural effusions or pneumothorax. No acute osseous abnormality. IMPRESSION: 1. Similar patchy interstitial airspace opacities, concerning for multifocal pneumonia and/or aspiration. 2. Rounded opacity projecting over the right lung apex is favored external to the patient given this was not present on recent chest radiograph from December 09, 2020. However, recommend  attention on short interval follow-up. Electronically Signed   By: Feliberto Harts MD   On: 12/14/2020 07:32    Impression/Recommendations Active Problems:   Acute on chronic respiratory failure with hypoxia  Pneumonia with Klebsiella Tracheobronchitis  COVID-19 virus infection Leukocytosis Right pneumothorax Diabetes mellitus type 2 Subdural hematoma Coronary artery disease with PEA arrest Sacrococcygeal pressure ulcer, unspecified stage Dysphagia/protein calorie malnutrition  Acute on chronic respiratory failure: Patient initially had COVID-19 infection and subsequent bacterial pneumonia.  He received treatment with multiple rounds of IV antibiotics.  As soon as antibiotics are stopped he starts having worsening leukocytosis, fever.  Recent respiratory cultures from 12/09/2020 showing  Klebsiella.  Started on ceftriaxone and Flagyl.  Plan to treat for duration of 7-10 days pending improvement.  Pneumonia/tracheobronchitis: He has been treated with multiple rounds of antibiotics.  Now having leukocytosis with respiratory cultures as mentioned above.  Based on the susceptibilities started on ceftriaxone.  Flagyl added for anaerobic coverage.  We will plan to treat for duration of 7-10 days pending improvement.  He also has a chronic trach.  Previously had complication with the trach for which he was transferred to [facility].  [facility]  Trach management per pulmonary and primary team. If his respiratory status is not improving consider repeating chest imaging preferably chest CT to better evaluate.   Leukocytosis: Continue to monitor WBC count. Antibiotics as mentioned above.  If he is having fevers or worsening leukocytosis consider sending pancultures including blood cultures and UA and urine cultures and repeat imaging.  COVID-19 infection: Patient received treatment at the acute facility and also received treatment with hydroxyurea here.  Continue supportive management per primary team.  Right pneumothorax: He status post chest tube removal.  Continue to monitor.  Diabetes mellitus type 2: Continue to monitor Accu-Cheks, medications and management of diabetes per the primary team.  Subdural hematoma: Patient has a small subdural hematoma with no neurologic sequelae.  Continue to monitor closely.  Coronary artery disease with PEA arrest: He status post stent placement.  He is on prasugrel, Lopressor. Continue medications and management per the primary team.    Sacrococcygeal pressure ulcer, unspecified stage: Continue local wound care.  Due to his debility he is high risk for worsening of the wounds.  Dysphagia/protein calorie malnutrition: Continue management per the primary team. Due to his dysphagia he is high risk for aspiration and recurrent  aspiration pneumonia despite being on antibiotics.  Due to his complex medical problems he is very high risk for worsening and decompensation.  Thank you for this consultation.  Plan of care discussed with the primary team.  Vonzella Nipple M.D. 12/15/2020, 5:16 PM

## 2020-12-16 DIAGNOSIS — A419 Sepsis, unspecified organism: Secondary | ICD-10-CM | POA: Diagnosis not present

## 2020-12-16 DIAGNOSIS — J9621 Acute and chronic respiratory failure with hypoxia: Secondary | ICD-10-CM | POA: Diagnosis not present

## 2020-12-16 DIAGNOSIS — U071 COVID-19: Secondary | ICD-10-CM | POA: Diagnosis not present

## 2020-12-16 DIAGNOSIS — Z930 Tracheostomy status: Secondary | ICD-10-CM | POA: Diagnosis not present

## 2020-12-16 NOTE — Progress Notes (Signed)
Pulmonary Critical Care Medicine [facility] [place]   PULMONARY CRITICAL CARE SERVICE  PROGRESS NOTE  Date of Service: 12/16/2020  Lawrence Freeman  BFX:[id number]  DOB: November 20, 1971   DOA: 11/23/2020  Referring Physician: Carron Curie, MD  HPI: Lawrence Freeman is a 49 y.o. male seen for follow up of Acute on Chronic Respiratory Failure.  Currently is on full support and pressure control mode briefly did wean yesterday with heated high flow once the patient was premedicated with his anxiety medication  Medications: Reviewed on Rounds  Physical Exam:  Vitals: Temperature is 97.5 pulse 96 respiratory 24 blood pressure is one 1/60 saturations 100%  Ventilator Settings pressure assist control FiO2 40% IP 22 PEEP 5  . General: Comfortable at this time . Eyes: Grossly normal lids, irises & conjunctiva . ENT: grossly tongue is normal . Neck: no obvious mass . Cardiovascular: S1 S2 normal no gallop . Respiratory: No rhonchi no rales noted . Abdomen: soft . Skin: no rash seen on limited exam . Musculoskeletal: not rigid . Psychiatric:unable to assess . Neurologic: no seizure no involuntary movements         Lab Data:   Basic Metabolic Panel: Recent Labs  Lab 12/10/20 0404 12/11/20 1536 12/14/20 0321  NA 131*  --  137  K 5.7* 4.2 3.9  CL 87*  --  94*  CO2 30  --  32  GLUCOSE 295*  --  132*  BUN 34*  --  26*  CREATININE 0.80  --  0.54*  CALCIUM 9.6  --  9.1    ABG: No results for input(s): PHART, PCO2ART, PO2ART, HCO3, O2SAT in the last 168 hours.  Liver Function Tests: No results for input(s): AST, ALT, ALKPHOS, BILITOT, PROT, ALBUMIN in the last 168 hours. No results for input(s): LIPASE, AMYLASE in the last 168 hours. No results for input(s): AMMONIA in the last 168 hours.  CBC: Recent Labs  Lab 12/10/20 0404 12/14/20 0321  WBC 13.3* 14.5*  HGB 10.9* 9.4*  HCT 31.2* 28.7*  MCV 94.3 98.3  PLT 469* 438*    Cardiac Enzymes: No  results for input(s): CKTOTAL, CKMB, CKMBINDEX, TROPONINI in the last 168 hours.  BNP (last 3 results) No results for input(s): BNP in the last 8760 hours.  ProBNP (last 3 results) No results for input(s): PROBNP in the last 8760 hours.  Radiological Exams: No results found.  Assessment/Plan Active Problems:   Acute on chronic respiratory failure with hypoxia (HCC)   COVID-19 virus infection   Tracheostomy status (HCC)   Severe sepsis (HCC)   1. Acute on chronic respiratory failure hypoxia we will once again try heated high flow premedicate as discussed 2. COVID-19 virus infection recovery infectious disease following 3. Severe sepsis resolved 4. Tracheostomy remains in place we will continue with supportive care   I have personally seen and evaluated the patient, evaluated laboratory and imaging results, formulated the assessment and plan and placed orders. The Patient requires high complexity decision making with multiple systems involvement.  Rounds were done with the Respiratory Therapy Director and Staff therapists and discussed with nursing staff also.  Yevonne Pax, MD [facility] Pulmonary Critical Care Medicine Sleep Medicine

## 2020-12-17 DIAGNOSIS — J9621 Acute and chronic respiratory failure with hypoxia: Secondary | ICD-10-CM | POA: Diagnosis not present

## 2020-12-17 DIAGNOSIS — Z930 Tracheostomy status: Secondary | ICD-10-CM | POA: Diagnosis not present

## 2020-12-17 DIAGNOSIS — U071 COVID-19: Secondary | ICD-10-CM | POA: Diagnosis not present

## 2020-12-17 DIAGNOSIS — A419 Sepsis, unspecified organism: Secondary | ICD-10-CM | POA: Diagnosis not present

## 2020-12-17 NOTE — Progress Notes (Signed)
Pulmonary Critical Care Medicine [facility] [place]   PULMONARY CRITICAL CARE SERVICE  PROGRESS NOTE  Date of Service: 12/17/2020  Lawrence Freeman  [id number]  DOB: 1971-07-11   DOA: 11/23/2020  Referring Physician: Carron Curie, MD  HPI: Lawrence Freeman is a 49 y.o. male seen for follow up of Acute on Chronic Respiratory Failure.  Patient is on full support now on pressure control mode has been on 40% FiO2  Medications: Reviewed on Rounds  Physical Exam:  Vitals: Temperature is 97.8 pulse 95 respiratory 24 blood pressure 98/65 saturations 99%  Ventilator Settings on pressure assist control FiO2 is 40% IP 20 PEEP 5  . General: Comfortable at this time . Eyes: Grossly normal lids, irises & conjunctiva . ENT: grossly tongue is normal . Neck: no obvious mass . Cardiovascular: S1 S2 normal no gallop . Respiratory: Scattered coarse rhonchi expansion is equal . Abdomen: soft . Skin: no rash seen on limited exam . Musculoskeletal: not rigid . Psychiatric:unable to assess . Neurologic: no seizure no involuntary movements         Lab Data:   Basic Metabolic Panel: Recent Labs  Lab 12/11/20 1536 12/14/20 0321  NA  --  137  K 4.2 3.9  CL  --  94*  CO2  --  32  GLUCOSE  --  132*  BUN  --  26*  CREATININE  --  0.54*  CALCIUM  --  9.1    ABG: No results for input(s): PHART, PCO2ART, PO2ART, HCO3, O2SAT in the last 168 hours.  Liver Function Tests: No results for input(s): AST, ALT, ALKPHOS, BILITOT, PROT, ALBUMIN in the last 168 hours. No results for input(s): LIPASE, AMYLASE in the last 168 hours. No results for input(s): AMMONIA in the last 168 hours.  CBC: Recent Labs  Lab 12/14/20 0321  WBC 14.5*  HGB 9.4*  HCT 28.7*  MCV 98.3  PLT 438*    Cardiac Enzymes: No results for input(s): CKTOTAL, CKMB, CKMBINDEX, TROPONINI in the last 168 hours.  BNP (last 3 results) No results for input(s): BNP in the last 8760 hours.  ProBNP  (last 3 results) No results for input(s): PROBNP in the last 8760 hours.  Radiological Exams: No results found.  Assessment/Plan Active Problems:   Acute on chronic respiratory failure with hypoxia (HCC)   COVID-19 virus infection   Tracheostomy status (HCC)   Severe sepsis (HCC)   1. Acute on chronic respiratory failure hypoxia we will continue with full support on the ventilator and pressure control.  Patient intermittently tolerates the wean however has great deal of anxiety she is still and he is dictating to the therapist when he wants to come off and when he does not want to come off 2. COVID-19 virus infection is treated in resolution 3. Severe sepsis resolved 4. Tracheostomy remains in place   I have personally seen and evaluated the patient, evaluated laboratory and imaging results, formulated the assessment and plan and placed orders. The Patient requires high complexity decision making with multiple systems involvement.  Rounds were done with the Respiratory Therapy Director and Staff therapists and discussed with nursing staff also.  Yevonne Pax, MD [facility] Pulmonary Critical Care Medicine Sleep Medicine

## 2020-12-18 DIAGNOSIS — A419 Sepsis, unspecified organism: Secondary | ICD-10-CM | POA: Diagnosis not present

## 2020-12-18 DIAGNOSIS — J9621 Acute and chronic respiratory failure with hypoxia: Secondary | ICD-10-CM | POA: Diagnosis not present

## 2020-12-18 DIAGNOSIS — U071 COVID-19: Secondary | ICD-10-CM | POA: Diagnosis not present

## 2020-12-18 DIAGNOSIS — Z930 Tracheostomy status: Secondary | ICD-10-CM | POA: Diagnosis not present

## 2020-12-18 LAB — CBC
HCT: 31.8 % — ABNORMAL LOW (ref 39.0–52.0)
Hemoglobin: 10.3 g/dL — ABNORMAL LOW (ref 13.0–17.0)
MCH: 32.3 pg (ref 26.0–34.0)
MCHC: 32.4 g/dL (ref 30.0–36.0)
MCV: 99.7 fL (ref 80.0–100.0)
Platelets: 311 10*3/uL (ref 150–400)
RBC: 3.19 MIL/uL — ABNORMAL LOW (ref 4.22–5.81)
RDW: 15.2 % (ref 11.5–15.5)
WBC: 14.6 10*3/uL — ABNORMAL HIGH (ref 4.0–10.5)
nRBC: 0.1 % (ref 0.0–0.2)

## 2020-12-18 LAB — BASIC METABOLIC PANEL
Anion gap: 12 (ref 5–15)
BUN: 27 mg/dL — ABNORMAL HIGH (ref 6–20)
CO2: 30 mmol/L (ref 22–32)
Calcium: 9.1 mg/dL (ref 8.9–10.3)
Chloride: 95 mmol/L — ABNORMAL LOW (ref 98–111)
Creatinine, Ser: 0.59 mg/dL — ABNORMAL LOW (ref 0.61–1.24)
GFR, Estimated: >60 mL/min (ref >60)
Glucose, Bld: 184 mg/dL — ABNORMAL HIGH (ref 70–99)
Potassium: 4.1 mmol/L (ref 3.5–5.1)
Sodium: 137 mmol/L (ref 135–145)

## 2020-12-18 NOTE — Progress Notes (Signed)
Pulmonary Critical Care Medicine [facility] [place]   PULMONARY CRITICAL CARE SERVICE  PROGRESS NOTE  Date of Service: 12/18/2020  Lawrence Freeman  ULA:[id number]  DOB: [birth year]   DOA: 11/23/2020  Referring Physician: Carron Curie, MD  HPI: Lawrence Freeman is a 49 y.o. male seen for follow up of Acute on Chronic Respiratory Failure.  Patient currently is on pressure support has been on 40% FiO2 was able to do about 5 hours yesterday still has a lot of issues with anxiety  Medications: Reviewed on Rounds  Physical Exam:  Vitals: Temperature 96.3 pulse 108 respiratory rate 26 blood pressure 132/72 saturations 99%  Ventilator Settings on pressure support FiO2 is 40% pressure 12/5  . General: Comfortable at this time . Eyes: Grossly normal lids, irises & conjunctiva . ENT: grossly tongue is normal . Neck: no obvious mass . Cardiovascular: S1 S2 normal no gallop . Respiratory: No rhonchi very coarse breath sounds . Abdomen: soft . Skin: no rash seen on limited exam . Musculoskeletal: not rigid . Psychiatric:unable to assess . Neurologic: no seizure no involuntary movements         Lab Data:   Basic Metabolic Panel: Recent Labs  Lab 12/14/20 0321 12/18/20 0542  NA 137 137  K 3.9 4.1  CL 94* 95*  CO2 32 30  GLUCOSE 132* 184*  BUN 26* 27*  CREATININE 0.54* 0.59*  CALCIUM 9.1 9.1    ABG: No results for input(s): PHART, PCO2ART, PO2ART, HCO3, O2SAT in the last 168 hours.  Liver Function Tests: No results for input(s): AST, ALT, ALKPHOS, BILITOT, PROT, ALBUMIN in the last 168 hours. No results for input(s): LIPASE, AMYLASE in the last 168 hours. No results for input(s): AMMONIA in the last 168 hours.  CBC: Recent Labs  Lab 12/14/20 0321 12/18/20 0542  WBC 14.5* 14.6*  HGB 9.4* 10.3*  HCT 28.7* 31.8*  MCV 98.3 99.7  PLT 438* 311    Cardiac Enzymes: No results for input(s): CKTOTAL, CKMB, CKMBINDEX, TROPONINI in the last 168  hours.  BNP (last 3 results) No results for input(s): BNP in the last 8760 hours.  ProBNP (last 3 results) No results for input(s): PROBNP in the last 8760 hours.  Radiological Exams: No results found.  Assessment/Plan Active Problems:   Acute on chronic respiratory failure with hypoxia (HCC)   COVID-19 virus infection   Tracheostomy status (HCC)   Severe sepsis (HCC)   1. Acute on chronic respiratory failure with hypoxia we will continue to make attempts at weaning on pressure support.  With secretion management supportive care 2. COVID-19 virus infection recovery we will continue to follow 3. Tracheostomy remains in place 4. Severe sepsis resolved hemodynamics are stable   I have personally seen and evaluated the patient, evaluated laboratory and imaging results, formulated the assessment and plan and placed orders. The Patient requires high complexity decision making with multiple systems involvement.  Rounds were done with the Respiratory Therapy Director and Staff therapists and discussed with nursing staff also.  Yevonne Pax, MD [facility] Pulmonary Critical Care Medicine Sleep Medicine

## 2020-12-19 DIAGNOSIS — U071 COVID-19: Secondary | ICD-10-CM | POA: Diagnosis not present

## 2020-12-19 DIAGNOSIS — J9621 Acute and chronic respiratory failure with hypoxia: Secondary | ICD-10-CM | POA: Diagnosis not present

## 2020-12-19 DIAGNOSIS — Z930 Tracheostomy status: Secondary | ICD-10-CM | POA: Diagnosis not present

## 2020-12-19 DIAGNOSIS — A419 Sepsis, unspecified organism: Secondary | ICD-10-CM | POA: Diagnosis not present

## 2020-12-19 NOTE — Progress Notes (Signed)
Pulmonary Critical Care Medicine [facility] [place]   PULMONARY CRITICAL CARE SERVICE  PROGRESS NOTE  Date of Service: 12/19/2020  Lawrence Freeman  [id number]:[id number]  DOB: 1971-11-30   DOA: 11/23/2020  Referring Physician: Carron Curie, MD  HPI: Lawrence Freeman is a 49 y.o. male seen for follow up of Acute on Chronic Respiratory Failure.  Patient currently is on pressure control has been on 40% FiO2.  Consistently weaning failure has been noted discussed on rounds doing modified wean on him with SIMV instead of pressure support direct  Medications: Reviewed on Rounds  Physical Exam:  Vitals: Temperature 97.8 pulse 115 respiratory rate 27 blood pressure is 96/51 saturations 98%  Ventilator Settings pressure control FiO2 40% IP 22 PEEP 5  . General: Comfortable at this time . Eyes: Grossly normal lids, irises & conjunctiva . ENT: grossly tongue is normal . Neck: no obvious mass . Cardiovascular: S1 S2 normal no gallop . Respiratory: Scattered rhonchi expansion is equal . Abdomen: soft . Skin: no rash seen on limited exam . Musculoskeletal: not rigid . Psychiatric:unable to assess . Neurologic: no seizure no involuntary movements         Lab Data:   Basic Metabolic Panel: Recent Labs  Lab 12/14/20 0321 12/18/20 0542  NA 137 137  K 3.9 4.1  CL 94* 95*  CO2 32 30  GLUCOSE 132* 184*  BUN 26* 27*  CREATININE 0.54* 0.59*  CALCIUM 9.1 9.1    ABG: No results for input(s): PHART, PCO2ART, PO2ART, HCO3, O2SAT in the last 168 hours.  Liver Function Tests: No results for input(s): AST, ALT, ALKPHOS, BILITOT, PROT, ALBUMIN in the last 168 hours. No results for input(s): LIPASE, AMYLASE in the last 168 hours. No results for input(s): AMMONIA in the last 168 hours.  CBC: Recent Labs  Lab 12/14/20 0321 12/18/20 0542  WBC 14.5* 14.6*  HGB 9.4* 10.3*  HCT 28.7* 31.8*  MCV 98.3 99.7  PLT 438* 311    Cardiac Enzymes: No results for input(s):  CKTOTAL, CKMB, CKMBINDEX, TROPONINI in the last 168 hours.  BNP (last 3 results) No results for input(s): BNP in the last 8760 hours.  ProBNP (last 3 results) No results for input(s): PROBNP in the last 8760 hours.  Radiological Exams: No results found.  Assessment/Plan Active Problems:   Acute on chronic respiratory failure with hypoxia (HCC)   COVID-19 virus infection   Tracheostomy status (HCC)   Severe sepsis (HCC)   1. Acute on chronic respiratory failure hypoxia we will continue with full support on the ventilator.  As discussed during rounds will do modified wean protocol on him 2. COVID-19 virus infection recovery 3. Severe sepsis resolved 4. Tracheostomy will remain in   I have personally seen and evaluated the patient, evaluated laboratory and imaging results, formulated the assessment and plan and placed orders. The Patient requires high complexity decision making with multiple systems involvement.  Rounds were done with the Respiratory Therapy Director and Staff therapists and discussed with nursing staff also.  Yevonne Pax, MD [facility] Pulmonary Critical Care Medicine Sleep Medicine

## 2020-12-20 ENCOUNTER — Other Ambulatory Visit (HOSPITAL_COMMUNITY): Payer: Self-pay

## 2020-12-20 DIAGNOSIS — J9621 Acute and chronic respiratory failure with hypoxia: Secondary | ICD-10-CM | POA: Diagnosis not present

## 2020-12-20 DIAGNOSIS — Z930 Tracheostomy status: Secondary | ICD-10-CM | POA: Diagnosis not present

## 2020-12-20 DIAGNOSIS — U071 COVID-19: Secondary | ICD-10-CM | POA: Diagnosis not present

## 2020-12-20 DIAGNOSIS — A419 Sepsis, unspecified organism: Secondary | ICD-10-CM | POA: Diagnosis not present

## 2020-12-20 NOTE — Progress Notes (Addendum)
Pulmonary Critical Care Medicine [facility] [place]   PULMONARY CRITICAL CARE SERVICE  PROGRESS NOTE  Date of Service: 12/20/2020  Lawrence Freeman  [id number]  DOB: [birth year]   DOA: 11/23/2020  Referring Physician: Carron Curie, MD  HPI: Lawrence Freeman is a 49 y.o. male seen for follow up of Acute on Chronic Respiratory Failure.  Patient had 5 hours of SIMV wean yesterday patient right now is on 40% FiO2  Medications: Reviewed on Rounds  Physical Exam:  Vitals: Temperature 99.6 pulse 111 respiratory 29 blood pressure is 108/55 saturations 98%  Ventilator Settings on SIMV FiO2 40% PC 20 to pressure support 12 PEEP 5  . General: Comfortable at this time . Eyes: Grossly normal lids, irises & conjunctiva . ENT: grossly tongue is normal . Neck: no obvious mass . Cardiovascular: S1 S2 normal no gallop . Respiratory: Scattered rhonchi expansion is . Abdomen: soft . Skin: no rash seen on limited exam . Musculoskeletal: not rigid . Psychiatric:unable to assess . Neurologic: no seizure no involuntary movements         Lab Data:   Basic Metabolic Panel: Recent Labs  Lab 12/14/20 0321 12/18/20 0542  NA 137 137  K 3.9 4.1  CL 94* 95*  CO2 32 30  GLUCOSE 132* 184*  BUN 26* 27*  CREATININE 0.54* 0.59*  CALCIUM 9.1 9.1    ABG: No results for input(s): PHART, PCO2ART, PO2ART, HCO3, O2SAT in the last 168 hours.  Liver Function Tests: No results for input(s): AST, ALT, ALKPHOS, BILITOT, PROT, ALBUMIN in the last 168 hours. No results for input(s): LIPASE, AMYLASE in the last 168 hours. No results for input(s): AMMONIA in the last 168 hours.  CBC: Recent Labs  Lab 12/14/20 0321 12/18/20 0542  WBC 14.5* 14.6*  HGB 9.4* 10.3*  HCT 28.7* 31.8*  MCV 98.3 99.7  PLT 438* 311    Cardiac Enzymes: No results for input(s): CKTOTAL, CKMB, CKMBINDEX, TROPONINI in the last 168 hours.  BNP (last 3 results) No results for input(s): BNP in the  last 8760 hours.  ProBNP (last 3 results) No results for input(s): PROBNP in the last 8760 hours.  Radiological Exams: No results found.  Assessment/Plan Active Problems:   Acute on chronic respiratory failure with hypoxia (HCC)   COVID-19 virus infection   Tracheostomy status (HCC)   Severe sepsis (HCC)   1. Acute on chronic respiratory failure with hypoxia we will continue with modified wean protocol titrate as tolerated 2. COVID-19 virus infection in recovery 3. Tracheostomy remains in place 4. Severe sepsis resolved   I have personally seen and evaluated the patient, evaluated laboratory and imaging results, formulated the assessment and plan and placed orders. The Patient requires high complexity decision making with multiple systems involvement.  Rounds were done with the Respiratory Therapy Director and Staff therapists and discussed with nursing staff also.  Yevonne Pax, MD [facility] Pulmonary Critical Care Medicine Sleep Medicine

## 2020-12-21 DIAGNOSIS — U071 COVID-19: Secondary | ICD-10-CM | POA: Diagnosis not present

## 2020-12-21 DIAGNOSIS — J9621 Acute and chronic respiratory failure with hypoxia: Secondary | ICD-10-CM | POA: Diagnosis not present

## 2020-12-21 DIAGNOSIS — A419 Sepsis, unspecified organism: Secondary | ICD-10-CM | POA: Diagnosis not present

## 2020-12-21 DIAGNOSIS — Z930 Tracheostomy status: Secondary | ICD-10-CM | POA: Diagnosis not present

## 2020-12-21 LAB — BASIC METABOLIC PANEL
Anion gap: 9 (ref 5–15)
BUN: 28 mg/dL — ABNORMAL HIGH (ref 6–20)
CO2: 33 mmol/L — ABNORMAL HIGH (ref 22–32)
Calcium: 9 mg/dL (ref 8.9–10.3)
Chloride: 98 mmol/L (ref 98–111)
Creatinine, Ser: 0.65 mg/dL (ref 0.61–1.24)
GFR, Estimated: >60 mL/min (ref >60)
Glucose, Bld: 170 mg/dL — ABNORMAL HIGH (ref 70–99)
Potassium: 4.6 mmol/L (ref 3.5–5.1)
Sodium: 140 mmol/L (ref 135–145)

## 2020-12-21 NOTE — Progress Notes (Signed)
Pulmonary Critical Care Medicine [facility] [place]   PULMONARY CRITICAL CARE SERVICE  PROGRESS NOTE  Date of Service: 12/21/2020  Lawrence Freeman  [id number]  DOB: October 16, 1971   DOA: 11/23/2020  Referring Physician: Carron Curie, MD  HPI: Lawrence Freeman is a 49 y.o. male seen for follow up of Acute on Chronic Respiratory Failure.  Patient is on SIMV He seems to be doing little bit better.  Rate today is supposed to be decreased down to 8  Medications: Reviewed on Rounds  Physical Exam:  Vitals: Temperature is 97.6 pulse 103 respiratory rate 26 blood pressure 107/65 saturations 100%  Ventilator Settings on SIMV pressure support 12 PEEP 5  . General: Comfortable at this time . Eyes: Grossly normal lids, irises & conjunctiva . ENT: grossly tongue is normal . Neck: no obvious mass . Cardiovascular: S1 S2 normal no gallop . Respiratory: No rhonchi no rales are noted at this time . Abdomen: soft . Skin: no rash seen on limited exam . Musculoskeletal: not rigid . Psychiatric:unable to assess . Neurologic: no seizure no involuntary movements         Lab Data:   Basic Metabolic Panel: Recent Labs  Lab 12/18/20 0542 12/21/20 0403  NA 137 140  K 4.1 4.6  CL 95* 98  CO2 30 33*  GLUCOSE 184* 170*  BUN 27* 28*  CREATININE 0.59* 0.65  CALCIUM 9.1 9.0    ABG: No results for input(s): PHART, PCO2ART, PO2ART, HCO3, O2SAT in the last 168 hours.  Liver Function Tests: No results for input(s): AST, ALT, ALKPHOS, BILITOT, PROT, ALBUMIN in the last 168 hours. No results for input(s): LIPASE, AMYLASE in the last 168 hours. No results for input(s): AMMONIA in the last 168 hours.  CBC: Recent Labs  Lab 12/18/20 0542  WBC 14.6*  HGB 10.3*  HCT 31.8*  MCV 99.7  PLT 311    Cardiac Enzymes: No results for input(s): CKTOTAL, CKMB, CKMBINDEX, TROPONINI in the last 168 hours.  BNP (last 3 results) No results for input(s): BNP in the last 8760  hours.  ProBNP (last 3 results) No results for input(s): PROBNP in the last 8760 hours.  Radiological Exams: DG Chest Port 1 View  Result Date: 12/20/2020 CLINICAL DATA:  Respiratory failure. EXAM: PORTABLE CHEST 1 VIEW COMPARISON:  One-view chest x-ray 12/14/2020 FINDINGS: Heart size normal. Tracheostomy tube is stable. Lung volumes are low. Diffuse interstitial and airspace opacities slightly more prominent prior study IMPRESSION: Slight increase in diffuse interstitial and airspace disease consistent with ARDS. Tracheostomy tube is stable. Electronically Signed   By: Marin Roberts M.D.   On: 12/20/2020 14:45    Assessment/Plan Active Problems:   Acute on chronic respiratory failure with hypoxia (HCC)   COVID-19 virus infection   Tracheostomy status (HCC)   Severe sepsis (HCC)   1. Acute on chronic respiratory failure with hypoxia plan is to continue with the gradual wean decrease respiratory rate down to 8 as discussed 2. COVID-19 virus infection in recovery we will continue with supportive care 3. Tracheostomy remains in place 4. Severe sepsis treated and resolved   I have personally seen and evaluated the patient, evaluated laboratory and imaging results, formulated the assessment and plan and placed orders. The Patient requires high complexity decision making with multiple systems involvement.  Rounds were done with the Respiratory Therapy Director and Staff therapists and discussed with nursing staff also.  Yevonne Pax, MD [facility] Pulmonary Critical Care Medicine Sleep Medicine

## 2020-12-22 DIAGNOSIS — U071 COVID-19: Secondary | ICD-10-CM | POA: Diagnosis not present

## 2020-12-22 DIAGNOSIS — J9621 Acute and chronic respiratory failure with hypoxia: Secondary | ICD-10-CM | POA: Diagnosis not present

## 2020-12-22 DIAGNOSIS — A419 Sepsis, unspecified organism: Secondary | ICD-10-CM | POA: Diagnosis not present

## 2020-12-22 DIAGNOSIS — Z930 Tracheostomy status: Secondary | ICD-10-CM | POA: Diagnosis not present

## 2020-12-22 NOTE — Progress Notes (Signed)
Pulmonary Critical Care Medicine [facility] [place]   PULMONARY CRITICAL CARE SERVICE  PROGRESS NOTE  Date of Service: 12/22/2020  Sahil Milner  [id number]  DOB: 1971/09/07   DOA: 11/23/2020  Referring Physician: Carron Curie, MD  HPI: Kayshawn Ozburn is a 49 y.o. male seen for follow up of Acute on Chronic Respiratory Failure.  Patient currently is on SIMV on 40% FiO2 tolerating it well  Medications: Reviewed on Rounds  Physical Exam:  Vitals: Temperature is 97.3 pulse 85 respiratory 19 blood pressure is 126/77 saturations 94%  Ventilator Settings on SIMV FiO2 40% pressure support 14/5  . General: Comfortable at this time . Eyes: Grossly normal lids, irises & conjunctiva . ENT: grossly tongue is normal . Neck: no obvious mass . Cardiovascular: S1 S2 normal no gallop . Respiratory: No rhonchi very coarse breath sound . Abdomen: soft . Skin: no rash seen on limited exam . Musculoskeletal: not rigid . Psychiatric:unable to assess . Neurologic: no seizure no involuntary movements         Lab Data:   Basic Metabolic Panel: Recent Labs  Lab 12/18/20 0542 12/21/20 0403  NA 137 140  K 4.1 4.6  CL 95* 98  CO2 30 33*  GLUCOSE 184* 170*  BUN 27* 28*  CREATININE 0.59* 0.65  CALCIUM 9.1 9.0    ABG: No results for input(s): PHART, PCO2ART, PO2ART, HCO3, O2SAT in the last 168 hours.  Liver Function Tests: No results for input(s): AST, ALT, ALKPHOS, BILITOT, PROT, ALBUMIN in the last 168 hours. No results for input(s): LIPASE, AMYLASE in the last 168 hours. No results for input(s): AMMONIA in the last 168 hours.  CBC: Recent Labs  Lab 12/18/20 0542  WBC 14.6*  HGB 10.3*  HCT 31.8*  MCV 99.7  PLT 311    Cardiac Enzymes: No results for input(s): CKTOTAL, CKMB, CKMBINDEX, TROPONINI in the last 168 hours.  BNP (last 3 results) No results for input(s): BNP in the last 8760 hours.  ProBNP (last 3 results) No results for  input(s): PROBNP in the last 8760 hours.  Radiological Exams: DG Chest Port 1 View  Result Date: 12/20/2020 CLINICAL DATA:  Respiratory failure. EXAM: PORTABLE CHEST 1 VIEW COMPARISON:  One-view chest x-ray 12/14/2020 FINDINGS: Heart size normal. Tracheostomy tube is stable. Lung volumes are low. Diffuse interstitial and airspace opacities slightly more prominent prior study IMPRESSION: Slight increase in diffuse interstitial and airspace disease consistent with ARDS. Tracheostomy tube is stable. Electronically Signed   By: Marin Roberts M.D.   On: 12/20/2020 14:45    Assessment/Plan Active Problems:   Acute on chronic respiratory failure with hypoxia (HCC)   COVID-19 virus infection   Tracheostomy status (HCC)   Severe sepsis (HCC)   1. Acute on chronic respiratory failure hypoxia we will continue with weaning process modified wean protocol 2. Chronic tracheostomy right now will remain in place working towards liberation from ventilator 3. Severe sepsis resolved 4. COVID-19 virus infection resolved   I have personally seen and evaluated the patient, evaluated laboratory and imaging results, formulated the assessment and plan and placed orders. The Patient requires high complexity decision making with multiple systems involvement.  Rounds were done with the Respiratory Therapy Director and Staff therapists and discussed with nursing staff also.  Yevonne Pax, MD [facility] Pulmonary Critical Care Medicine Sleep Medicine

## 2020-12-23 DIAGNOSIS — J9621 Acute and chronic respiratory failure with hypoxia: Secondary | ICD-10-CM | POA: Diagnosis not present

## 2020-12-23 DIAGNOSIS — A419 Sepsis, unspecified organism: Secondary | ICD-10-CM | POA: Diagnosis not present

## 2020-12-23 DIAGNOSIS — Z930 Tracheostomy status: Secondary | ICD-10-CM | POA: Diagnosis not present

## 2020-12-23 DIAGNOSIS — U071 COVID-19: Secondary | ICD-10-CM | POA: Diagnosis not present

## 2020-12-23 NOTE — Progress Notes (Signed)
Pulmonary Critical Care Medicine [facility] [place]   PULMONARY CRITICAL CARE SERVICE  PROGRESS NOTE  Date of Service: 12/23/2020  Lawrence Freeman  [id number]  DOB: 1971-12-02   DOA: 11/23/2020  Referring Physician: Carron Curie, MD  HPI: Lawrence Freeman is a 49 y.o. male seen for follow up of Acute on Chronic Respiratory Failure.  Patient currently is on pressure support has been on 40% FiO2 with a pressure of 14/5  Medications: Reviewed on Rounds  Physical Exam:  Vitals: Temperature is 97.5 pulse 116 respiratory 27 blood pressure is 123/62 saturations 100%  Ventilator Settings on pressure support FiO2 is 40% pressure 14/5  . General: Comfortable at this time . Eyes: Grossly normal lids, irises & conjunctiva . ENT: grossly tongue is normal . Neck: no obvious mass . Cardiovascular: S1 S2 normal no gallop . Respiratory: No rhonchi no rales noted . Abdomen: soft . Skin: no rash seen on limited exam . Musculoskeletal: not rigid . Psychiatric:unable to assess . Neurologic: no seizure no involuntary movements         Lab Data:   Basic Metabolic Panel: Recent Labs  Lab 12/18/20 0542 12/21/20 0403  NA 137 140  K 4.1 4.6  CL 95* 98  CO2 30 33*  GLUCOSE 184* 170*  BUN 27* 28*  CREATININE 0.59* 0.65  CALCIUM 9.1 9.0    ABG: No results for input(s): PHART, PCO2ART, PO2ART, HCO3, O2SAT in the last 168 hours.  Liver Function Tests: No results for input(s): AST, ALT, ALKPHOS, BILITOT, PROT, ALBUMIN in the last 168 hours. No results for input(s): LIPASE, AMYLASE in the last 168 hours. No results for input(s): AMMONIA in the last 168 hours.  CBC: Recent Labs  Lab 12/18/20 0542  WBC 14.6*  HGB 10.3*  HCT 31.8*  MCV 99.7  PLT 311    Cardiac Enzymes: No results for input(s): CKTOTAL, CKMB, CKMBINDEX, TROPONINI in the last 168 hours.  BNP (last 3 results) No results for input(s): BNP in the last 8760 hours.  ProBNP (last 3  results) No results for input(s): PROBNP in the last 8760 hours.  Radiological Exams: No results found.  Assessment/Plan Active Problems:   Acute on chronic respiratory failure with hypoxia (HCC)   COVID-19 virus infection   Tracheostomy status (HCC)   Severe sepsis (HCC)   1. Acute on chronic respiratory failure hypoxia plan is to continue with the pressure support titrate oxygen as tolerated we will continue with pulmonary toilet. 2. COVID-19 virus infection we will continue to follow 3. Tracheostomy right now remains in place 4. Severe sepsis resolved hemodynamics are stable   I have personally seen and evaluated the patient, evaluated laboratory and imaging results, formulated the assessment and plan and placed orders. The Patient requires high complexity decision making with multiple systems involvement.  Rounds were done with the Respiratory Therapy Director and Staff therapists and discussed with nursing staff also.  Yevonne Pax, MD [facility] Pulmonary Critical Care Medicine Sleep Medicine

## 2020-12-24 DIAGNOSIS — Z930 Tracheostomy status: Secondary | ICD-10-CM | POA: Diagnosis not present

## 2020-12-24 DIAGNOSIS — A419 Sepsis, unspecified organism: Secondary | ICD-10-CM | POA: Diagnosis not present

## 2020-12-24 DIAGNOSIS — U071 COVID-19: Secondary | ICD-10-CM | POA: Diagnosis not present

## 2020-12-24 DIAGNOSIS — J9621 Acute and chronic respiratory failure with hypoxia: Secondary | ICD-10-CM | POA: Diagnosis not present

## 2020-12-24 NOTE — Progress Notes (Signed)
Pulmonary Critical Care Medicine [facility] [place]   PULMONARY CRITICAL CARE SERVICE  PROGRESS NOTE  Date of Service: 12/24/2020  Lawrence Freeman  [id number]  DOB: November 05, 1971   DOA: 11/23/2020  Referring Physician: Carron Curie, MD  HPI: Lawrence Freeman is a 50 y.o. male seen for follow up of Acute on Chronic Respiratory Failure.  Patient is on pressure support trying to do a goal of 4 hours today still has a lot of issues with anxiety  Medications: Reviewed on Rounds  Physical Exam:  Vitals: Temperature 96.8 pulse 104 respiratory 24 blood pressure is 98/61 saturations 99%  Ventilator Settings on pressure support  . General: Comfortable at this time . Eyes: Grossly normal lids, irises & conjunctiva . ENT: grossly tongue is normal . Neck: no obvious mass . Cardiovascular: S1 S2 normal no gallop . Respiratory: No rhonchi no rales noted . Abdomen: soft . Skin: no rash seen on limited exam . Musculoskeletal: not rigid . Psychiatric:unable to assess . Neurologic: no seizure no involuntary movements         Lab Data:   Basic Metabolic Panel: Recent Labs  Lab 12/18/20 0542 12/21/20 0403  NA 137 140  K 4.1 4.6  CL 95* 98  CO2 30 33*  GLUCOSE 184* 170*  BUN 27* 28*  CREATININE 0.59* 0.65  CALCIUM 9.1 9.0    ABG: No results for input(s): PHART, PCO2ART, PO2ART, HCO3, O2SAT in the last 168 hours.  Liver Function Tests: No results for input(s): AST, ALT, ALKPHOS, BILITOT, PROT, ALBUMIN in the last 168 hours. No results for input(s): LIPASE, AMYLASE in the last 168 hours. No results for input(s): AMMONIA in the last 168 hours.  CBC: Recent Labs  Lab 12/18/20 0542  WBC 14.6*  HGB 10.3*  HCT 31.8*  MCV 99.7  PLT 311    Cardiac Enzymes: No results for input(s): CKTOTAL, CKMB, CKMBINDEX, TROPONINI in the last 168 hours.  BNP (last 3 results) No results for input(s): BNP in the last 8760 hours.  ProBNP (last 3 results) No results  for input(s): PROBNP in the last 8760 hours.  Radiological Exams: No results found.  Assessment/Plan Active Problems:   Acute on chronic respiratory failure with hypoxia (HCC)   COVID-19 virus infection   Tracheostomy status (HCC)   Severe sepsis (HCC)   1. Acute on chronic respiratory failure hypoxia continue with the wean on pressure support titrate as tolerated. 2. COVID-19 virus infection recovery phase 3. Severe sepsis resolved 4. Tracheostomy will remain in place   I have personally seen and evaluated the patient, evaluated laboratory and imaging results, formulated the assessment and plan and placed orders. The Patient requires high complexity decision making with multiple systems involvement.  Rounds were done with the Respiratory Therapy Director and Staff therapists and discussed with nursing staff also.  Yevonne Pax, MD [facility] Pulmonary Critical Care Medicine Sleep Medicine

## 2020-12-25 DIAGNOSIS — U071 COVID-19: Secondary | ICD-10-CM | POA: Diagnosis not present

## 2020-12-25 DIAGNOSIS — J9621 Acute and chronic respiratory failure with hypoxia: Secondary | ICD-10-CM | POA: Diagnosis not present

## 2020-12-25 DIAGNOSIS — A419 Sepsis, unspecified organism: Secondary | ICD-10-CM | POA: Diagnosis not present

## 2020-12-25 DIAGNOSIS — Z930 Tracheostomy status: Secondary | ICD-10-CM | POA: Diagnosis not present

## 2020-12-25 NOTE — Progress Notes (Signed)
Pulmonary Critical Care Medicine [facility] [place]   PULMONARY CRITICAL CARE SERVICE  PROGRESS NOTE  Date of Service: 12/25/2020  Blythe Hartshorn  [id number]  DOB: [birth year]   DOA: 11/23/2020  Referring Physician: Carron Curie, MD  HPI: Ardith Lewman is a 50 y.o. male seen for follow up of Acute on Chronic Respiratory Failure.  Patient currently is on pressure support has been on 40% FiO2 good saturations are noted at this time  Medications: Reviewed on Rounds  Physical Exam:  Vitals: Temperature is 96.9 pulse 109 respiratory 30 blood pressure is 109/68 saturations 100%  Ventilator Settings currently on pressure support FiO2 40% pressure 14/5  . General: Comfortable at this time . Eyes: Grossly normal lids, irises & conjunctiva . ENT: grossly tongue is normal . Neck: no obvious mass . Cardiovascular: S1 S2 normal no gallop . Respiratory: No rhonchi no rales are noted at this time . Abdomen: soft . Skin: no rash seen on limited exam . Musculoskeletal: not rigid . Psychiatric:unable to assess . Neurologic: no seizure no involuntary movements         Lab Data:   Basic Metabolic Panel: Recent Labs  Lab 12/21/20 0403  NA 140  K 4.6  CL 98  CO2 33*  GLUCOSE 170*  BUN 28*  CREATININE 0.65  CALCIUM 9.0    ABG: No results for input(s): PHART, PCO2ART, PO2ART, HCO3, O2SAT in the last 168 hours.  Liver Function Tests: No results for input(s): AST, ALT, ALKPHOS, BILITOT, PROT, ALBUMIN in the last 168 hours. No results for input(s): LIPASE, AMYLASE in the last 168 hours. No results for input(s): AMMONIA in the last 168 hours.  CBC: No results for input(s): WBC, NEUTROABS, HGB, HCT, MCV, PLT in the last 168 hours.  Cardiac Enzymes: No results for input(s): CKTOTAL, CKMB, CKMBINDEX, TROPONINI in the last 168 hours.  BNP (last 3 results) No results for input(s): BNP in the last 8760 hours.  ProBNP (last 3 results) No results for  input(s): PROBNP in the last 8760 hours.  Radiological Exams: No results found.  Assessment/Plan Active Problems:   Acute on chronic respiratory failure with hypoxia (HCC)   COVID-19 virus infection   Tracheostomy status (HCC)   Severe sepsis (HCC)   1. Acute on chronic respiratory failure hypoxia plan is to continue with the pressure support wean as tolerated 2. COVID-19 virus infection we will continue to follow along improved 3. Tracheostomy remains in place 4. Severe sepsis resolved   I have personally seen and evaluated the patient, evaluated laboratory and imaging results, formulated the assessment and plan and placed orders. The Patient requires high complexity decision making with multiple systems involvement.  Rounds were done with the Respiratory Therapy Director and Staff therapists and discussed with nursing staff also.  Yevonne Pax, MD [facility] Pulmonary Critical Care Medicine Sleep Medicine

## 2020-12-26 DIAGNOSIS — J9621 Acute and chronic respiratory failure with hypoxia: Secondary | ICD-10-CM | POA: Diagnosis not present

## 2020-12-26 DIAGNOSIS — Z930 Tracheostomy status: Secondary | ICD-10-CM | POA: Diagnosis not present

## 2020-12-26 DIAGNOSIS — U071 COVID-19: Secondary | ICD-10-CM | POA: Diagnosis not present

## 2020-12-26 DIAGNOSIS — A419 Sepsis, unspecified organism: Secondary | ICD-10-CM | POA: Diagnosis not present

## 2020-12-26 NOTE — Progress Notes (Signed)
Pulmonary Critical Care Medicine [facility] [place]   PULMONARY CRITICAL CARE SERVICE  PROGRESS NOTE  Date of Service: 12/26/2020  Lawrence Freeman  [id number]  DOB: Apr 09, 1971   DOA: 11/23/2020  Referring Physician: Carron Curie, MD  HPI: Lawrence Freeman is a 50 y.o. male seen for follow up of Acute on Chronic Respiratory Failure.  Currently is on SIMV on a rate of 6 with close to pressure support wean today  Medications: Reviewed on Rounds  Physical Exam:  Vitals: Temperature is 97.7 pulse 112 respiratory rate is 28 blood pressure is 135/53 saturations 99%  Ventilator Settings on SIMV rate is 6 pressure support 12/5  . General: Comfortable at this time . Eyes: Grossly normal lids, irises & conjunctiva . ENT: grossly tongue is normal . Neck: no obvious mass . Cardiovascular: S1 S2 normal no gallop . Respiratory: No rhonchi very coarse breath sounds . Abdomen: soft . Skin: no rash seen on limited exam . Musculoskeletal: not rigid . Psychiatric:unable to assess . Neurologic: no seizure no involuntary movements         Lab Data:   Basic Metabolic Panel: Recent Labs  Lab 12/21/20 0403  NA 140  K 4.6  CL 98  CO2 33*  GLUCOSE 170*  BUN 28*  CREATININE 0.65  CALCIUM 9.0    ABG: No results for input(s): PHART, PCO2ART, PO2ART, HCO3, O2SAT in the last 168 hours.  Liver Function Tests: No results for input(s): AST, ALT, ALKPHOS, BILITOT, PROT, ALBUMIN in the last 168 hours. No results for input(s): LIPASE, AMYLASE in the last 168 hours. No results for input(s): AMMONIA in the last 168 hours.  CBC: No results for input(s): WBC, NEUTROABS, HGB, HCT, MCV, PLT in the last 168 hours.  Cardiac Enzymes: No results for input(s): CKTOTAL, CKMB, CKMBINDEX, TROPONINI in the last 168 hours.  BNP (last 3 results) No results for input(s): BNP in the last 8760 hours.  ProBNP (last 3 results) No results for input(s): PROBNP in the last 8760  hours.  Radiological Exams: No results found.  Assessment/Plan Active Problems:   Acute on chronic respiratory failure with hypoxia (HCC)   COVID-19 virus infection   Tracheostomy status (HCC)   Severe sepsis (HCC)   1. Acute on chronic respiratory failure hypoxia we will continue with the weaning process titrate oxygen continue pulmonary toilet 2. COVID-19 virus infection recovery 3. Tracheostomy remains in place 4. Severe sepsis treated resolved   I have personally seen and evaluated the patient, evaluated laboratory and imaging results, formulated the assessment and plan and placed orders. The Patient requires high complexity decision making with multiple systems involvement.  Rounds were done with the Respiratory Therapy Director and Staff therapists and discussed with nursing staff also.  Yevonne Pax, MD [facility] Pulmonary Critical Care Medicine Sleep Medicine

## 2020-12-27 DIAGNOSIS — J9621 Acute and chronic respiratory failure with hypoxia: Secondary | ICD-10-CM | POA: Diagnosis not present

## 2020-12-27 DIAGNOSIS — U071 COVID-19: Secondary | ICD-10-CM | POA: Diagnosis not present

## 2020-12-27 DIAGNOSIS — A419 Sepsis, unspecified organism: Secondary | ICD-10-CM | POA: Diagnosis not present

## 2020-12-27 DIAGNOSIS — Z930 Tracheostomy status: Secondary | ICD-10-CM | POA: Diagnosis not present

## 2020-12-27 NOTE — Progress Notes (Signed)
Pulmonary Critical Care Medicine [facility] [place]   PULMONARY CRITICAL CARE SERVICE  PROGRESS NOTE  Date of Service: 12/27/2020  Lawrence Freeman  [id number]  DOB: 1971-04-12   DOA: 11/23/2020  Referring Physician: Carron Curie, MD  HPI: Lawrence Freeman is a 50 y.o. male seen for follow up of Acute on Chronic Respiratory Failure.  Patient is on SIMV currently on 40% FiO2 has been on a PEEP of 5 with pressure support of 14  Medications: Reviewed on Rounds  Physical Exam:  Vitals: Temperature is 96.7 pulse 87 blood pressure 163/87 saturations 98%  Ventilator Settings SIMV FiO2 40% pressure support 14 PEEP 5  . General: Comfortable at this time . Eyes: Grossly normal lids, irises & conjunctiva . ENT: grossly tongue is normal . Neck: no obvious mass . Cardiovascular: S1 S2 normal no gallop . Respiratory: No rhonchi very coarse breath sounds . Abdomen: soft . Skin: no rash seen on limited exam . Musculoskeletal: not rigid . Psychiatric:unable to assess . Neurologic: no seizure no involuntary movements         Lab Data:   Basic Metabolic Panel: Recent Labs  Lab 12/21/20 0403  NA 140  K 4.6  CL 98  CO2 33*  GLUCOSE 170*  BUN 28*  CREATININE 0.65  CALCIUM 9.0    ABG: No results for input(s): PHART, PCO2ART, PO2ART, HCO3, O2SAT in the last 168 hours.  Liver Function Tests: No results for input(s): AST, ALT, ALKPHOS, BILITOT, PROT, ALBUMIN in the last 168 hours. No results for input(s): LIPASE, AMYLASE in the last 168 hours. No results for input(s): AMMONIA in the last 168 hours.  CBC: No results for input(s): WBC, NEUTROABS, HGB, HCT, MCV, PLT in the last 168 hours.  Cardiac Enzymes: No results for input(s): CKTOTAL, CKMB, CKMBINDEX, TROPONINI in the last 168 hours.  BNP (last 3 results) No results for input(s): BNP in the last 8760 hours.  ProBNP (last 3 results) No results for input(s): PROBNP in the last 8760  hours.  Radiological Exams: No results found.  Assessment/Plan Active Problems:   Acute on chronic respiratory failure with hypoxia (HCC)   COVID-19 virus infection   Tracheostomy status (HCC)   Severe sepsis (HCC)   1. Acute on chronic respiratory failure hypoxia we will continue to try to make advances on weaning patient requesting to be postponed again 2. COVID-19 virus infection in recovery 3. Tracheostomy remains in place 4. Severe sepsis this has resolved   I have personally seen and evaluated the patient, evaluated laboratory and imaging results, formulated the assessment and plan and placed orders. The Patient requires high complexity decision making with multiple systems involvement.  Rounds were done with the Respiratory Therapy Director and Staff therapists and discussed with nursing staff also.  Yevonne Pax, MD [facility] Pulmonary Critical Care Medicine Sleep Medicine

## 2020-12-28 DIAGNOSIS — Z930 Tracheostomy status: Secondary | ICD-10-CM | POA: Diagnosis not present

## 2020-12-28 DIAGNOSIS — J9621 Acute and chronic respiratory failure with hypoxia: Secondary | ICD-10-CM | POA: Diagnosis not present

## 2020-12-28 DIAGNOSIS — U071 COVID-19: Secondary | ICD-10-CM | POA: Diagnosis not present

## 2020-12-28 DIAGNOSIS — A419 Sepsis, unspecified organism: Secondary | ICD-10-CM | POA: Diagnosis not present

## 2020-12-28 NOTE — Progress Notes (Signed)
Pulmonary Critical Care Medicine [facility] [place]   PULMONARY CRITICAL CARE SERVICE  PROGRESS NOTE  Date of Service: 12/28/2020  Tanveer Brammer  [id number]  DOB: 1971-09-23   DOA: 11/23/2020  Referring Physician: Carron Curie, MD  HPI: Lawrence Freeman is a 50 y.o. male seen for follow up of Acute on Chronic Respiratory Failure.  Patient currently is on SIMV on a rate of 8 on pressure support of 14/5  Medications: Reviewed on Rounds  Physical Exam:  Vitals: Temperature is 97.4 pulse 94 respiratory 25 blood pressure is 110/65 saturations 100%  Ventilator Settings on SIMV rate of 8 pressure "14 PEEP 5  . General: Comfortable at this time . Eyes: Grossly normal lids, irises & conjunctiva . ENT: grossly tongue is normal . Neck: no obvious mass . Cardiovascular: S1 S2 normal no gallop . Respiratory: No rhonchi no rales . Abdomen: soft . Skin: no rash seen on limited exam . Musculoskeletal: not rigid . Psychiatric:unable to assess . Neurologic: no seizure no involuntary movements         Lab Data:   Basic Metabolic Panel: No results for input(s): NA, K, CL, CO2, GLUCOSE, BUN, CREATININE, CALCIUM, MG, PHOS in the last 168 hours.  ABG: No results for input(s): PHART, PCO2ART, PO2ART, HCO3, O2SAT in the last 168 hours.  Liver Function Tests: No results for input(s): AST, ALT, ALKPHOS, BILITOT, PROT, ALBUMIN in the last 168 hours. No results for input(s): LIPASE, AMYLASE in the last 168 hours. No results for input(s): AMMONIA in the last 168 hours.  CBC: No results for input(s): WBC, NEUTROABS, HGB, HCT, MCV, PLT in the last 168 hours.  Cardiac Enzymes: No results for input(s): CKTOTAL, CKMB, CKMBINDEX, TROPONINI in the last 168 hours.  BNP (last 3 results) No results for input(s): BNP in the last 8760 hours.  ProBNP (last 3 results) No results for input(s): PROBNP in the last 8760 hours.  Radiological Exams: No results  found.  Assessment/Plan Active Problems:   Acute on chronic respiratory failure with hypoxia (HCC)   COVID-19 virus infection   Tracheostomy status (HCC)   Severe sepsis (HCC)   1. Acute on chronic respiratory failure with hypoxia continue with the modified SIMV wean as tolerated. 2. COVID-19 virus infection in recovery we will continue with supportive care 3. Severe sepsis resolved 4. Tracheostomy remains in place at this time   I have personally seen and evaluated the patient, evaluated laboratory and imaging results, formulated the assessment and plan and placed orders. The Patient requires high complexity decision making with multiple systems involvement.  Rounds were done with the Respiratory Therapy Director and Staff therapists and discussed with nursing staff also.  Yevonne Pax, MD [facility] Pulmonary Critical Care Medicine Sleep Medicine

## 2020-12-29 ENCOUNTER — Other Ambulatory Visit (HOSPITAL_COMMUNITY): Payer: Self-pay

## 2020-12-29 DIAGNOSIS — J9621 Acute and chronic respiratory failure with hypoxia: Secondary | ICD-10-CM | POA: Diagnosis not present

## 2020-12-29 DIAGNOSIS — A419 Sepsis, unspecified organism: Secondary | ICD-10-CM | POA: Diagnosis not present

## 2020-12-29 DIAGNOSIS — Z930 Tracheostomy status: Secondary | ICD-10-CM | POA: Diagnosis not present

## 2020-12-29 DIAGNOSIS — U071 COVID-19: Secondary | ICD-10-CM | POA: Diagnosis not present

## 2020-12-29 LAB — COMPREHENSIVE METABOLIC PANEL
ALT: 20 U/L (ref 0–44)
AST: 18 U/L (ref 15–41)
Albumin: 3.1 g/dL — ABNORMAL LOW (ref 3.5–5.0)
Alkaline Phosphatase: 106 U/L (ref 38–126)
Anion gap: 13 (ref 5–15)
BUN: 37 mg/dL — ABNORMAL HIGH (ref 6–20)
CO2: 32 mmol/L (ref 22–32)
Calcium: 9.7 mg/dL (ref 8.9–10.3)
Chloride: 90 mmol/L — ABNORMAL LOW (ref 98–111)
Creatinine, Ser: 0.68 mg/dL (ref 0.61–1.24)
GFR, Estimated: >60 mL/min (ref >60)
Glucose, Bld: 140 mg/dL — ABNORMAL HIGH (ref 70–99)
Potassium: 4.4 mmol/L (ref 3.5–5.1)
Sodium: 135 mmol/L (ref 135–145)
Total Bilirubin: 0.4 mg/dL (ref 0.3–1.2)
Total Protein: 6 g/dL — ABNORMAL LOW (ref 6.5–8.1)

## 2020-12-29 LAB — CBC
HCT: 31.6 % — ABNORMAL LOW (ref 39.0–52.0)
Hemoglobin: 10.7 g/dL — ABNORMAL LOW (ref 13.0–17.0)
MCH: 32.7 pg (ref 26.0–34.0)
MCHC: 33.9 g/dL (ref 30.0–36.0)
MCV: 96.6 fL (ref 80.0–100.0)
Platelets: 287 10*3/uL (ref 150–400)
RBC: 3.27 MIL/uL — ABNORMAL LOW (ref 4.22–5.81)
RDW: 15.2 % (ref 11.5–15.5)
WBC: 16.8 10*3/uL — ABNORMAL HIGH (ref 4.0–10.5)
nRBC: 0.2 % (ref 0.0–0.2)

## 2020-12-29 NOTE — Progress Notes (Signed)
Pulmonary Critical Care Medicine [facility] [place]   PULMONARY CRITICAL CARE SERVICE  PROGRESS NOTE  Date of Service: 12/29/2020  Lawrence Freeman  [id number]  DOB: 1971-10-29   DOA: 11/23/2020  Referring Physician: Carron Curie, MD  HPI: Lawrence Freeman is a 50 y.o. male seen for follow up of Acute on Chronic Respiratory Failure.  Patient is on pressure control mode currently on 40% FiO2 doing well with PEEP of 5  Medications: Reviewed on Rounds  Physical Exam:  Vitals: Temperature is 97.5 pulse 115 respiratory rate is 30 blood pressure 108/77 saturations 100%  Ventilator Settings on pressure control FiO2 is 40% PEEP 5 tidal volume is 400 IP 22  . General: Comfortable at this time . Eyes: Grossly normal lids, irises & conjunctiva . ENT: grossly tongue is normal . Neck: no obvious mass . Cardiovascular: S1 S2 normal no gallop . Respiratory: Coarse breath sounds with few scattered . Abdomen: soft . Skin: no rash seen on limited exam . Musculoskeletal: not rigid . Psychiatric:unable to assess . Neurologic: no seizure no involuntary movements         Lab Data:   Basic Metabolic Panel: Recent Labs  Lab 12/29/20 0258  NA 135  K 4.4  CL 90*  CO2 32  GLUCOSE 140*  BUN 37*  CREATININE 0.68  CALCIUM 9.7    ABG: No results for input(s): PHART, PCO2ART, PO2ART, HCO3, O2SAT in the last 168 hours.  Liver Function Tests: Recent Labs  Lab 12/29/20 0258  AST 18  ALT 20  ALKPHOS 106  BILITOT 0.4  PROT 6.0*  ALBUMIN 3.1*   No results for input(s): LIPASE, AMYLASE in the last 168 hours. No results for input(s): AMMONIA in the last 168 hours.  CBC: Recent Labs  Lab 12/29/20 0258  WBC 16.8*  HGB 10.7*  HCT 31.6*  MCV 96.6  PLT 287    Cardiac Enzymes: No results for input(s): CKTOTAL, CKMB, CKMBINDEX, TROPONINI in the last 168 hours.  BNP (last 3 results) No results for input(s): BNP in the last 8760 hours.  ProBNP (last 3  results) No results for input(s): PROBNP in the last 8760 hours.  Radiological Exams: No results found.  Assessment/Plan Active Problems:   Acute on chronic respiratory failure with hypoxia (HCC)   COVID-19 virus infection   Tracheostomy status (HCC)   Severe sepsis (HCC)   1. Acute on chronic respiratory failure with hypoxia continue with pressure control titrate oxygen and continue pulmonary toilet.  Patient is not tolerating weaning attempts secondary to increased anxiety issues 2. COVID-19 virus infection and recovery 3. Tracheostomy right now will remain in place 4. Severe sepsis this has returned   I have personally seen and evaluated the patient, evaluated laboratory and imaging results, formulated the assessment and plan and placed orders. The Patient requires high complexity decision making with multiple systems involvement.  Rounds were done with the Respiratory Therapy Director and Staff therapists and discussed with nursing staff also.  Yevonne Pax, MD [facility] Pulmonary Critical Care Medicine Sleep Medicine

## 2020-12-30 DIAGNOSIS — Z930 Tracheostomy status: Secondary | ICD-10-CM | POA: Diagnosis not present

## 2020-12-30 DIAGNOSIS — U071 COVID-19: Secondary | ICD-10-CM | POA: Diagnosis not present

## 2020-12-30 DIAGNOSIS — A419 Sepsis, unspecified organism: Secondary | ICD-10-CM | POA: Diagnosis not present

## 2020-12-30 DIAGNOSIS — J9621 Acute and chronic respiratory failure with hypoxia: Secondary | ICD-10-CM | POA: Diagnosis not present

## 2020-12-30 NOTE — Progress Notes (Signed)
Pulmonary Critical Care Medicine [facility] [place]   PULMONARY CRITICAL CARE SERVICE  PROGRESS NOTE  Date of Service: 12/30/2020  Trenell Concannon  [id number]  DOB: 1971/04/05   DOA: 11/23/2020  Referring Physician: Carron Curie, MD  HPI: Ved Martos is a 50 y.o. male seen for follow up of Acute on Chronic Respiratory Failure.  Patient is on pressure support has been on 40% FiO2  Medications: Reviewed on Rounds  Physical Exam:  Vitals: Temperature is 97.8 pulse 95 respiratory 24 blood pressure is 121/73 saturations 100%  Ventilator Settings on pressure support FiO2 40% pressure 14/5  . General: Comfortable at this time . Eyes: Grossly normal lids, irises & conjunctiva . ENT: grossly tongue is normal . Neck: no obvious mass . Cardiovascular: S1 S2 normal no gallop . Respiratory: No rhonchi no rales are noted at this time . Abdomen: soft . Skin: no rash seen on limited exam . Musculoskeletal: not rigid . Psychiatric:unable to assess . Neurologic: no seizure no involuntary movements         Lab Data:   Basic Metabolic Panel: Recent Labs  Lab 12/29/20 0258  NA 135  K 4.4  CL 90*  CO2 32  GLUCOSE 140*  BUN 37*  CREATININE 0.68  CALCIUM 9.7    ABG: No results for input(s): PHART, PCO2ART, PO2ART, HCO3, O2SAT in the last 168 hours.  Liver Function Tests: Recent Labs  Lab 12/29/20 0258  AST 18  ALT 20  ALKPHOS 106  BILITOT 0.4  PROT 6.0*  ALBUMIN 3.1*   No results for input(s): LIPASE, AMYLASE in the last 168 hours. No results for input(s): AMMONIA in the last 168 hours.  CBC: Recent Labs  Lab 12/29/20 0258  WBC 16.8*  HGB 10.7*  HCT 31.6*  MCV 96.6  PLT 287    Cardiac Enzymes: No results for input(s): CKTOTAL, CKMB, CKMBINDEX, TROPONINI in the last 168 hours.  BNP (last 3 results) No results for input(s): BNP in the last 8760 hours.  ProBNP (last 3 results) No results for input(s): PROBNP in the last 8760  hours.  Radiological Exams: DG CHEST PORT 1 VIEW  Result Date: 12/29/2020 CLINICAL DATA:  Pneumonia. EXAM: PORTABLE CHEST 1 VIEW COMPARISON:  Chest radiograph 12/20/2020. FINDINGS: Tracheostomy tube projects midline with the tip at the level of the clavicular head. Low lung volumes. Similar diffuse interstitial and patchy airspace opacities. No visible pleural effusions or pneumothorax. Similar cardiomediastinal silhouette. IMPRESSION: Similar diffuse interstitial and patchy airspace opacities. Electronically Signed   By: Feliberto Harts MD   On: 12/29/2020 12:19    Assessment/Plan Active Problems:   Acute on chronic respiratory failure with hypoxia (HCC)   COVID-19 virus infection   Tracheostomy status (HCC)   Severe sepsis (HCC)   1. Acute on chronic respiratory failure hypoxia plan is to continue with weaning on pressure support 2. COVID-19 virus infection recovery 3. Tracheostomy remains in place at this time 4. Severe anxiety reviewed the patient's medications which will be adjusted add Xanax switch to Klonopin to as needed and decrease the dosage of the Zoloft 5. Severe sepsis this has resolved   I have personally seen and evaluated the patient, evaluated laboratory and imaging results, formulated the assessment and plan and placed orders. The Patient requires high complexity decision making with multiple systems involvement.  Rounds were done with the Respiratory Therapy Director and Staff therapists and discussed with nursing staff also.  Yevonne Pax, MD [facility] Pulmonary Critical Care Medicine Sleep  Medicine

## 2020-12-31 DIAGNOSIS — A419 Sepsis, unspecified organism: Secondary | ICD-10-CM | POA: Diagnosis not present

## 2020-12-31 DIAGNOSIS — Z930 Tracheostomy status: Secondary | ICD-10-CM | POA: Diagnosis not present

## 2020-12-31 DIAGNOSIS — U071 COVID-19: Secondary | ICD-10-CM | POA: Diagnosis not present

## 2020-12-31 DIAGNOSIS — J9621 Acute and chronic respiratory failure with hypoxia: Secondary | ICD-10-CM | POA: Diagnosis not present

## 2020-12-31 LAB — MAGNESIUM: Magnesium: 2.4 mg/dL (ref 1.7–2.4)

## 2020-12-31 LAB — BASIC METABOLIC PANEL
Anion gap: 12 (ref 5–15)
BUN: 31 mg/dL — ABNORMAL HIGH (ref 6–20)
CO2: 32 mmol/L (ref 22–32)
Calcium: 10 mg/dL (ref 8.9–10.3)
Chloride: 90 mmol/L — ABNORMAL LOW (ref 98–111)
Creatinine, Ser: 0.56 mg/dL — ABNORMAL LOW (ref 0.61–1.24)
GFR, Estimated: >60 mL/min (ref >60)
Glucose, Bld: 134 mg/dL — ABNORMAL HIGH (ref 70–99)
Potassium: 4.5 mmol/L (ref 3.5–5.1)
Sodium: 134 mmol/L — ABNORMAL LOW (ref 135–145)

## 2020-12-31 LAB — CBC
HCT: 35.3 % — ABNORMAL LOW (ref 39.0–52.0)
Hemoglobin: 12.1 g/dL — ABNORMAL LOW (ref 13.0–17.0)
MCH: 33.2 pg (ref 26.0–34.0)
MCHC: 34.3 g/dL (ref 30.0–36.0)
MCV: 96.7 fL (ref 80.0–100.0)
Platelets: 323 10*3/uL (ref 150–400)
RBC: 3.65 MIL/uL — ABNORMAL LOW (ref 4.22–5.81)
RDW: 15.4 % (ref 11.5–15.5)
WBC: 21.1 10*3/uL — ABNORMAL HIGH (ref 4.0–10.5)
nRBC: 0 % (ref 0.0–0.2)

## 2020-12-31 NOTE — Progress Notes (Signed)
Pulmonary Critical Care Medicine [facility] [place]   PULMONARY CRITICAL CARE SERVICE  PROGRESS NOTE  Date of Service: 12/31/2020  [name]  [id number]  DOB: [birth year]   DOA: 11/23/2020  Referring Physician: Carron Curie, MD  HPI: Lawrence Freeman is a 50 y.o. male seen for follow up of Acute on Chronic Respiratory Failure.  Patient currently is on pressure control still having quite a bit of issues with anxiety.  Medications: Reviewed on Rounds  Physical Exam:  Vitals: Temperature is 96.8 pulse 100 respiratory rate 30 blood pressure is 125/78 saturations 100%  Ventilator Settings on pressure control FiO2 is 40%  . General: Comfortable at this time . Eyes: Grossly normal lids, irises & conjunctiva . ENT: grossly tongue is normal . Neck: no obvious mass . Cardiovascular: S1 S2 normal no gallop . Respiratory: No rhonchi very coarse breath sounds . Abdomen: soft . Skin: no rash seen on limited exam . Musculoskeletal: not rigid . Psychiatric:unable to assess . Neurologic: no seizure no involuntary movements         Lab Data:   Basic Metabolic Panel: Recent Labs  Lab 12/29/20 0258  NA 135  K 4.4  CL 90*  CO2 32  GLUCOSE 140*  BUN 37*  CREATININE 0.68  CALCIUM 9.7    ABG: No results for input(s): PHART, PCO2ART, PO2ART, HCO3, O2SAT in the last 168 hours.  Liver Function Tests: Recent Labs  Lab 12/29/20 0258  AST 18  ALT 20  ALKPHOS 106  BILITOT 0.4  PROT 6.0*  ALBUMIN 3.1*   No results for input(s): LIPASE, AMYLASE in the last 168 hours. No results for input(s): AMMONIA in the last 168 hours.  CBC: Recent Labs  Lab 12/29/20 0258  WBC 16.8*  HGB 10.7*  HCT 31.6*  MCV 96.6  PLT 287    Cardiac Enzymes: No results for input(s): CKTOTAL, CKMB, CKMBINDEX, TROPONINI in the last 168 hours.  BNP (last 3 results) No results for input(s): BNP in the last 8760 hours.  ProBNP (last 3 results) No results for input(s):  PROBNP in the last 8760 hours.  Radiological Exams: DG CHEST PORT 1 VIEW  Result Date: 12/29/2020 CLINICAL DATA:  Pneumonia. EXAM: PORTABLE CHEST 1 VIEW COMPARISON:  Chest radiograph 12/20/2020. FINDINGS: Tracheostomy tube projects midline with the tip at the level of the clavicular head. Low lung volumes. Similar diffuse interstitial and patchy airspace opacities. No visible pleural effusions or pneumothorax. Similar cardiomediastinal silhouette. IMPRESSION: Similar diffuse interstitial and patchy airspace opacities. Electronically Signed   By: Feliberto Harts MD   On: 12/29/2020 12:19    Assessment/Plan Active Problems:   Acute on chronic respiratory failure with hypoxia (HCC)   COVID-19 virus infection   Tracheostomy status (HCC)   Severe sepsis (HCC)   1. Acute on chronic respiratory failure with hypoxia we will continue with full support currently is on pressure control has been on 40% FiO2 with a PEEP of 5. 2. COVID-19 virus infection recovery 3. Tracheostomy remains in place 4. Severe sepsis   I have personally seen and evaluated the patient, evaluated laboratory and imaging results, formulated the assessment and plan and placed orders. The Patient requires high complexity decision making with multiple systems involvement.  Rounds were done with the Respiratory Therapy Director and Staff therapists and discussed with nursing staff also.  Yevonne Pax, MD [facility] Pulmonary Critical Care Medicine Sleep Medicine

## 2021-01-01 DIAGNOSIS — A419 Sepsis, unspecified organism: Secondary | ICD-10-CM | POA: Diagnosis not present

## 2021-01-01 DIAGNOSIS — U071 COVID-19: Secondary | ICD-10-CM | POA: Diagnosis not present

## 2021-01-01 DIAGNOSIS — Z930 Tracheostomy status: Secondary | ICD-10-CM | POA: Diagnosis not present

## 2021-01-01 DIAGNOSIS — J9621 Acute and chronic respiratory failure with hypoxia: Secondary | ICD-10-CM | POA: Diagnosis not present

## 2021-01-01 LAB — URINALYSIS, ROUTINE W REFLEX MICROSCOPIC
Bacteria, UA: NONE SEEN
Bilirubin Urine: NEGATIVE
Glucose, UA: NEGATIVE mg/dL
Ketones, ur: NEGATIVE mg/dL
Nitrite: NEGATIVE
Protein, ur: 30 mg/dL — AB
RBC / HPF: >50 RBC/hpf — ABNORMAL HIGH (ref 0–5)
Specific Gravity, Urine: 1.017 (ref 1.005–1.030)
pH: 7 (ref 5.0–8.0)

## 2021-01-01 NOTE — Progress Notes (Signed)
Pulmonary Critical Care Medicine [facility] [place]   PULMONARY CRITICAL CARE SERVICE  PROGRESS NOTE  Date of Service: 01/01/2021  Lawrence Freeman  [id number]  DOB: 1971/08/13   DOA: 11/23/2020  Referring Physician: Carron Curie, MD  HPI: Lawrence Freeman is a 50 y.o. male seen for follow up of Acute on Chronic Respiratory Failure.  Right now is on pressure support mode has been on 14/5  Medications: Reviewed on Rounds  Physical Exam:  Vitals: Temperature 96.7 pulse 101 respiratory 35 blood pressure is 139/90 saturations 100%  Ventilator Settings on pressure support FiO2 is 40%  . General: Comfortable at this time . Eyes: Grossly normal lids, irises & conjunctiva . ENT: grossly tongue is normal . Neck: no obvious mass . Cardiovascular: S1 S2 normal no gallop . Respiratory: Scattered rhonchi expansion equal . Abdomen: soft . Skin: no rash seen on limited exam . Musculoskeletal: not rigid . Psychiatric:unable to assess . Neurologic: no seizure no involuntary movements         Lab Data:   Basic Metabolic Panel: Recent Labs  Lab 12/29/20 0258 12/31/20 1544  NA 135 134*  K 4.4 4.5  CL 90* 90*  CO2 32 32  GLUCOSE 140* 134*  BUN 37* 31*  CREATININE 0.68 0.56*  CALCIUM 9.7 10.0  MG  --  2.4    ABG: No results for input(s): PHART, PCO2ART, PO2ART, HCO3, O2SAT in the last 168 hours.  Liver Function Tests: Recent Labs  Lab 12/29/20 0258  AST 18  ALT 20  ALKPHOS 106  BILITOT 0.4  PROT 6.0*  ALBUMIN 3.1*   No results for input(s): LIPASE, AMYLASE in the last 168 hours. No results for input(s): AMMONIA in the last 168 hours.  CBC: Recent Labs  Lab 12/29/20 0258 12/31/20 1544  WBC 16.8* 21.1*  HGB 10.7* 12.1*  HCT 31.6* 35.3*  MCV 96.6 96.7  PLT 287 323    Cardiac Enzymes: No results for input(s): CKTOTAL, CKMB, CKMBINDEX, TROPONINI in the last 168 hours.  BNP (last 3 results) No results for input(s): BNP in the last  8760 hours.  ProBNP (last 3 results) No results for input(s): PROBNP in the last 8760 hours.  Radiological Exams: No results found.  Assessment/Plan Active Problems:   Acute on chronic respiratory failure with hypoxia (HCC)   COVID-19 virus infection   Tracheostomy status (HCC)   Severe sepsis (HCC)   1. Acute on chronic respiratory failure with hypoxia patient continues to have difficulty tolerating the pressure support right now is on 14/5 we will continue to advance 2. COVID-19 virus infection recovery phase 3. Tracheostomy remains in place 4. Severe sepsis treated   I have personally seen and evaluated the patient, evaluated laboratory and imaging results, formulated the assessment and plan and placed orders. The Patient requires high complexity decision making with multiple systems involvement.  Rounds were done with the Respiratory Therapy Director and Staff therapists and discussed with nursing staff also.  Yevonne Pax, MD [facility] Pulmonary Critical Care Medicine Sleep Medicine

## 2021-01-02 DIAGNOSIS — U071 COVID-19: Secondary | ICD-10-CM | POA: Diagnosis not present

## 2021-01-02 DIAGNOSIS — J9621 Acute and chronic respiratory failure with hypoxia: Secondary | ICD-10-CM | POA: Diagnosis not present

## 2021-01-02 DIAGNOSIS — A419 Sepsis, unspecified organism: Secondary | ICD-10-CM | POA: Diagnosis not present

## 2021-01-02 DIAGNOSIS — Z930 Tracheostomy status: Secondary | ICD-10-CM | POA: Diagnosis not present

## 2021-01-02 NOTE — Progress Notes (Signed)
Pulmonary Critical Care Medicine [facility] [place]   PULMONARY CRITICAL CARE SERVICE  PROGRESS NOTE  Date of Service: 01/02/2021  Lawrence Freeman  [id number]  DOB: 1971/08/17   DOA: 11/23/2020  Referring Physician: Carron Curie, MD  HPI: Lawrence Freeman is a 50 y.o. male seen for follow up of Acute on Chronic Respiratory Failure.  Patient is on the ventilator and full support currently on pressure control mode has been on 40% FiO2  Medications: Reviewed on Rounds  Physical Exam:  Vitals: Temperature is 97.6 pulse 104 respiratory rate is 24 blood pressure is 128/78 saturations 100  Ventilator Settings on pressure assist control FiO2 40% IP 22 PEEP 5  . General: Comfortable at this time . Eyes: Grossly normal lids, irises & conjunctiva . ENT: grossly tongue is normal . Neck: no obvious mass . Cardiovascular: S1 S2 normal no gallop . Respiratory: No rhonchi no rales are noted at this . Abdomen: soft . Skin: no rash seen on limited exam . Musculoskeletal: not rigid . Psychiatric:unable to assess . Neurologic: no seizure no involuntary movements         Lab Data:   Basic Metabolic Panel: Recent Labs  Lab 12/29/20 0258 12/31/20 1544  NA 135 134*  K 4.4 4.5  CL 90* 90*  CO2 32 32  GLUCOSE 140* 134*  BUN 37* 31*  CREATININE 0.68 0.56*  CALCIUM 9.7 10.0  MG  --  2.4    ABG: No results for input(s): PHART, PCO2ART, PO2ART, HCO3, O2SAT in the last 168 hours.  Liver Function Tests: Recent Labs  Lab 12/29/20 0258  AST 18  ALT 20  ALKPHOS 106  BILITOT 0.4  PROT 6.0*  ALBUMIN 3.1*   No results for input(s): LIPASE, AMYLASE in the last 168 hours. No results for input(s): AMMONIA in the last 168 hours.  CBC: Recent Labs  Lab 12/29/20 0258 12/31/20 1544  WBC 16.8* 21.1*  HGB 10.7* 12.1*  HCT 31.6* 35.3*  MCV 96.6 96.7  PLT 287 323    Cardiac Enzymes: No results for input(s): CKTOTAL, CKMB, CKMBINDEX, TROPONINI in the last  168 hours.  BNP (last 3 results) No results for input(s): BNP in the last 8760 hours.  ProBNP (last 3 results) No results for input(s): PROBNP in the last 8760 hours.  Radiological Exams: No results found.  Assessment/Plan Active Problems:   Acute on chronic respiratory failure with hypoxia (HCC)   COVID-19 virus infection   Tracheostomy status (HCC)   Severe sepsis (HCC)   1. Acute on chronic respiratory failure hypoxia plan is going to be to try pressure support once again right now resting on full support. 2. COVID-19 virus infection recovery 3. Severe sepsis treated resolved 4. Tracheostomy remain in place 5. Anxiety trying to control with better medication regimen   I have personally seen and evaluated the patient, evaluated laboratory and imaging results, formulated the assessment and plan and placed orders. The Patient requires high complexity decision making with multiple systems involvement.  Rounds were done with the Respiratory Therapy Director and Staff therapists and discussed with nursing staff also.  Yevonne Pax, MD [facility] Pulmonary Critical Care Medicine Sleep Medicine

## 2021-01-03 DIAGNOSIS — U071 COVID-19: Secondary | ICD-10-CM | POA: Diagnosis not present

## 2021-01-03 DIAGNOSIS — A419 Sepsis, unspecified organism: Secondary | ICD-10-CM | POA: Diagnosis not present

## 2021-01-03 DIAGNOSIS — Z930 Tracheostomy status: Secondary | ICD-10-CM | POA: Diagnosis not present

## 2021-01-03 DIAGNOSIS — J9621 Acute and chronic respiratory failure with hypoxia: Secondary | ICD-10-CM | POA: Diagnosis not present

## 2021-01-03 LAB — BASIC METABOLIC PANEL
Anion gap: 12 (ref 5–15)
BUN: 28 mg/dL — ABNORMAL HIGH (ref 6–20)
CO2: 33 mmol/L — ABNORMAL HIGH (ref 22–32)
Calcium: 10 mg/dL (ref 8.9–10.3)
Chloride: 92 mmol/L — ABNORMAL LOW (ref 98–111)
Creatinine, Ser: 0.77 mg/dL (ref 0.61–1.24)
GFR, Estimated: >60 mL/min (ref >60)
Glucose, Bld: 151 mg/dL — ABNORMAL HIGH (ref 70–99)
Potassium: 4.7 mmol/L (ref 3.5–5.1)
Sodium: 137 mmol/L (ref 135–145)

## 2021-01-03 LAB — CBC
HCT: 32.7 % — ABNORMAL LOW (ref 39.0–52.0)
Hemoglobin: 11.1 g/dL — ABNORMAL LOW (ref 13.0–17.0)
MCH: 32.5 pg (ref 26.0–34.0)
MCHC: 33.9 g/dL (ref 30.0–36.0)
MCV: 95.6 fL (ref 80.0–100.0)
Platelets: 309 10*3/uL (ref 150–400)
RBC: 3.42 MIL/uL — ABNORMAL LOW (ref 4.22–5.81)
RDW: 15.6 % — ABNORMAL HIGH (ref 11.5–15.5)
WBC: 20.2 10*3/uL — ABNORMAL HIGH (ref 4.0–10.5)
nRBC: 0 % (ref 0.0–0.2)

## 2021-01-03 LAB — URINE CULTURE: Culture: NO GROWTH

## 2021-01-03 LAB — MAGNESIUM: Magnesium: 2.1 mg/dL (ref 1.7–2.4)

## 2021-01-03 NOTE — Progress Notes (Signed)
Pulmonary Critical Care Medicine [facility] [place]   PULMONARY CRITICAL CARE SERVICE  PROGRESS NOTE  Date of Service: 01/03/2021  Lawrence Freeman  [id number]  DOB: 04/18/1971   DOA: 11/23/2020  Referring Physician: Carron Curie, MD  HPI: Lawrence Freeman is a 50 y.o. male seen for follow up of Acute on Chronic Respiratory Failure.  Patient currently is on pressure control mode has been on 40% FiO2 IP is 22 with a PEEP of 5  Medications: Reviewed on Rounds  Physical Exam:  Vitals: Temperature 97.6 pulse 100 respiratory rate is 26 blood pressure is 128/81 saturations 100%  Ventilator Settings on pressure control FiO2 40% IP 22 PEEP 5  . General: Comfortable at this time . Eyes: Grossly normal lids, irises & conjunctiva . ENT: grossly tongue is normal . Neck: no obvious mass . Cardiovascular: S1 S2 normal no gallop . Respiratory: Coarse rhonchi expansion is equal . Abdomen: soft . Skin: no rash seen on limited exam . Musculoskeletal: not rigid . Psychiatric:unable to assess . Neurologic: no seizure no involuntary movements         Lab Data:   Basic Metabolic Panel: Recent Labs  Lab 12/29/20 0258 12/31/20 1544 01/03/21 0414  NA 135 134* 137  K 4.4 4.5 4.7  CL 90* 90* 92*  CO2 32 32 33*  GLUCOSE 140* 134* 151*  BUN 37* 31* 28*  CREATININE 0.68 0.56* 0.77  CALCIUM 9.7 10.0 10.0  MG  --  2.4 2.1    ABG: No results for input(s): PHART, PCO2ART, PO2ART, HCO3, O2SAT in the last 168 hours.  Liver Function Tests: Recent Labs  Lab 12/29/20 0258  AST 18  ALT 20  ALKPHOS 106  BILITOT 0.4  PROT 6.0*  ALBUMIN 3.1*   No results for input(s): LIPASE, AMYLASE in the last 168 hours. No results for input(s): AMMONIA in the last 168 hours.  CBC: Recent Labs  Lab 12/29/20 0258 12/31/20 1544 01/03/21 0414  WBC 16.8* 21.1* 20.2*  HGB 10.7* 12.1* 11.1*  HCT 31.6* 35.3* 32.7*  MCV 96.6 96.7 95.6  PLT 287 323 309    Cardiac  Enzymes: No results for input(s): CKTOTAL, CKMB, CKMBINDEX, TROPONINI in the last 168 hours.  BNP (last 3 results) No results for input(s): BNP in the last 8760 hours.  ProBNP (last 3 results) No results for input(s): PROBNP in the last 8760 hours.  Radiological Exams: No results found.  Assessment/Plan Active Problems:   Acute on chronic respiratory failure with hypoxia (HCC)   COVID-19 virus infection   Tracheostomy status (HCC)   Severe sepsis (HCC)   1. Acute on chronic respiratory failure with hypoxia we will continue with pressure control mode patient is on 40% FiO2 not tolerating any attempts at weaning so far due to severe anxiety 2. COVID-19 virus infection in resolution 3. Tracheostomy remains in place 4. Severe sepsis this has resolved   I have personally seen and evaluated the patient, evaluated laboratory and imaging results, formulated the assessment and plan and placed orders. The Patient requires high complexity decision making with multiple systems involvement.  Rounds were done with the Respiratory Therapy Director and Staff therapists and discussed with nursing staff also.  Yevonne Pax, MD [facility] Pulmonary Critical Care Medicine Sleep Medicine

## 2021-01-04 DIAGNOSIS — A419 Sepsis, unspecified organism: Secondary | ICD-10-CM | POA: Diagnosis not present

## 2021-01-04 DIAGNOSIS — U071 COVID-19: Secondary | ICD-10-CM | POA: Diagnosis not present

## 2021-01-04 DIAGNOSIS — J9621 Acute and chronic respiratory failure with hypoxia: Secondary | ICD-10-CM | POA: Diagnosis not present

## 2021-01-04 DIAGNOSIS — Z930 Tracheostomy status: Secondary | ICD-10-CM | POA: Diagnosis not present

## 2021-01-04 NOTE — Progress Notes (Signed)
Pulmonary Critical Care Medicine [facility] [place]   PULMONARY CRITICAL CARE SERVICE  PROGRESS NOTE  Date of Service: 01/04/2021  Lawrence Freeman  KGU:[id number]  DOB: 1971/01/24   DOA: 11/23/2020  Referring Physician: Carron Curie, MD  HPI: Lawrence Freeman is a 50 y.o. male seen for follow up of Acute on Chronic Respiratory Failure.  Patient currently is on pressure control mode has been on 40% FiO2 with an IP of 22 and a PEEP of 5  Medications: Reviewed on Rounds  Physical Exam:  Vitals: Temperature is 96.0 pulse 106 respiratory rate 28 blood pressure is 125/84 saturations 100%  Ventilator Settings on pressure assist control FiO2 40% IP 22 PEEP 5  . General: Comfortable at this time . Eyes: Grossly normal lids, irises & conjunctiva . ENT: grossly tongue is normal . Neck: no obvious mass . Cardiovascular: S1 S2 normal no gallop . Respiratory: Scattered rhonchi coarse breath sounds . Abdomen: soft . Skin: no rash seen on limited exam . Musculoskeletal: not rigid . Psychiatric:unable to assess . Neurologic: no seizure no involuntary movements         Lab Data:   Basic Metabolic Panel: Recent Labs  Lab 12/29/20 0258 12/31/20 1544 01/03/21 0414  NA 135 134* 137  K 4.4 4.5 4.7  CL 90* 90* 92*  CO2 32 32 33*  GLUCOSE 140* 134* 151*  BUN 37* 31* 28*  CREATININE 0.68 0.56* 0.77  CALCIUM 9.7 10.0 10.0  MG  --  2.4 2.1    ABG: No results for input(s): PHART, PCO2ART, PO2ART, HCO3, O2SAT in the last 168 hours.  Liver Function Tests: Recent Labs  Lab 12/29/20 0258  AST 18  ALT 20  ALKPHOS 106  BILITOT 0.4  PROT 6.0*  ALBUMIN 3.1*   No results for input(s): LIPASE, AMYLASE in the last 168 hours. No results for input(s): AMMONIA in the last 168 hours.  CBC: Recent Labs  Lab 12/29/20 0258 12/31/20 1544 01/03/21 0414  WBC 16.8* 21.1* 20.2*  HGB 10.7* 12.1* 11.1*  HCT 31.6* 35.3* 32.7*  MCV 96.6 96.7 95.6  PLT 287 323 309     Cardiac Enzymes: No results for input(s): CKTOTAL, CKMB, CKMBINDEX, TROPONINI in the last 168 hours.  BNP (last 3 results) No results for input(s): BNP in the last 8760 hours.  ProBNP (last 3 results) No results for input(s): PROBNP in the last 8760 hours.  Radiological Exams: No results found.  Assessment/Plan Active Problems:   Acute on chronic respiratory failure with hypoxia (HCC)   COVID-19 virus infection   Tracheostomy status (HCC)   Severe sepsis (HCC)   1. Acute on chronic respiratory failure with hypoxia we will continue with full support on the ventilator and pressure control mode currently on 40% FiO2 IP is 22 with a PEEP of 5. 2. COVID-19 virus infection recovery phase 3. Tracheostomy remains in place 4. Severe sepsis treated resolved   I have personally seen and evaluated the patient, evaluated laboratory and imaging results, formulated the assessment and plan and placed orders. The Patient requires high complexity decision making with multiple systems involvement.  Rounds were done with the Respiratory Therapy Director and Staff therapists and discussed with nursing staff also.  Yevonne Pax, MD [facility] Pulmonary Critical Care Medicine Sleep Medicine

## 2021-01-05 DIAGNOSIS — J9621 Acute and chronic respiratory failure with hypoxia: Secondary | ICD-10-CM | POA: Diagnosis not present

## 2021-01-05 DIAGNOSIS — A419 Sepsis, unspecified organism: Secondary | ICD-10-CM | POA: Diagnosis not present

## 2021-01-05 DIAGNOSIS — Z930 Tracheostomy status: Secondary | ICD-10-CM | POA: Diagnosis not present

## 2021-01-05 DIAGNOSIS — U071 COVID-19: Secondary | ICD-10-CM | POA: Diagnosis not present

## 2021-01-05 NOTE — Progress Notes (Signed)
Pulmonary Critical Care Medicine [facility] [place]   PULMONARY CRITICAL CARE SERVICE  PROGRESS NOTE  Date of Service: 01/05/2021  Lawrence Freeman  EUM:[id number]  DOB: 1971-11-04   DOA: 11/23/2020  Referring Physician: Carron Curie, MD  HPI: Lawrence Freeman is a 50 y.o. male seen for follow up of Acute on Chronic Respiratory Failure.  Patient is on full support on the ventilator continues to fail attempts at weaning.  Medications: Reviewed on Rounds  Physical Exam:  Vitals: Temperature is 97.0 pulse 98 respiratory rate 28 blood pressure is 126/88 saturations 100%  Ventilator Settings pressure control FiO2 40% PEEP five IP 22  . General: Comfortable at this time . Eyes: Grossly normal lids, irises & conjunctiva . ENT: grossly tongue is normal . Neck: no obvious mass . Cardiovascular: S1 S2 normal no gallop . Respiratory: No rhonchi very coarse breath sounds . Abdomen: soft . Skin: no rash seen on limited exam . Musculoskeletal: not rigid . Psychiatric:unable to assess . Neurologic: no seizure no involuntary movements         Lab Data:   Basic Metabolic Panel: Recent Labs  Lab 12/31/20 1544 01/03/21 0414  NA 134* 137  K 4.5 4.7  CL 90* 92*  CO2 32 33*  GLUCOSE 134* 151*  BUN 31* 28*  CREATININE 0.56* 0.77  CALCIUM 10.0 10.0  MG 2.4 2.1    ABG: No results for input(s): PHART, PCO2ART, PO2ART, HCO3, O2SAT in the last 168 hours.  Liver Function Tests: No results for input(s): AST, ALT, ALKPHOS, BILITOT, PROT, ALBUMIN in the last 168 hours. No results for input(s): LIPASE, AMYLASE in the last 168 hours. No results for input(s): AMMONIA in the last 168 hours.  CBC: Recent Labs  Lab 12/31/20 1544 01/03/21 0414  WBC 21.1* 20.2*  HGB 12.1* 11.1*  HCT 35.3* 32.7*  MCV 96.7 95.6  PLT 323 309    Cardiac Enzymes: No results for input(s): CKTOTAL, CKMB, CKMBINDEX, TROPONINI in the last 168 hours.  BNP (last 3 results) No results  for input(s): BNP in the last 8760 hours.  ProBNP (last 3 results) No results for input(s): PROBNP in the last 8760 hours.  Radiological Exams: No results found.  Assessment/Plan Active Problems:   Acute on chronic respiratory failure with hypoxia (HCC)   COVID-19 virus infection   Tracheostomy status (HCC)   Severe sepsis (HCC)   1. Acute on chronic respiratory failure hypoxia we will continue with pressure control titrate oxygen continue pulmonary toilet. 2. COVID-19 virus infection recovery 3. Tracheostomy remains in place 4. Severe sepsis treated resolved   I have personally seen and evaluated the patient, evaluated laboratory and imaging results, formulated the assessment and plan and placed orders. The Patient requires high complexity decision making with multiple systems involvement.  Rounds were done with the Respiratory Therapy Director and Staff therapists and discussed with nursing staff also.  Yevonne Pax, MD [facility] Pulmonary Critical Care Medicine Sleep Medicine

## 2021-01-06 DIAGNOSIS — Z930 Tracheostomy status: Secondary | ICD-10-CM | POA: Diagnosis not present

## 2021-01-06 DIAGNOSIS — J9621 Acute and chronic respiratory failure with hypoxia: Secondary | ICD-10-CM | POA: Diagnosis not present

## 2021-01-06 DIAGNOSIS — U071 COVID-19: Secondary | ICD-10-CM | POA: Diagnosis not present

## 2021-01-06 DIAGNOSIS — A419 Sepsis, unspecified organism: Secondary | ICD-10-CM | POA: Diagnosis not present

## 2021-01-06 LAB — CBC
HCT: 33.7 % — ABNORMAL LOW (ref 39.0–52.0)
Hemoglobin: 10.9 g/dL — ABNORMAL LOW (ref 13.0–17.0)
MCH: 32.2 pg (ref 26.0–34.0)
MCHC: 32.3 g/dL (ref 30.0–36.0)
MCV: 99.4 fL (ref 80.0–100.0)
Platelets: 317 10*3/uL (ref 150–400)
RBC: 3.39 MIL/uL — ABNORMAL LOW (ref 4.22–5.81)
RDW: 15.3 % (ref 11.5–15.5)
WBC: 12.8 10*3/uL — ABNORMAL HIGH (ref 4.0–10.5)
nRBC: 0 % (ref 0.0–0.2)

## 2021-01-06 LAB — BASIC METABOLIC PANEL
Anion gap: 15 (ref 5–15)
BUN: 28 mg/dL — ABNORMAL HIGH (ref 6–20)
CO2: 29 mmol/L (ref 22–32)
Calcium: 9.6 mg/dL (ref 8.9–10.3)
Chloride: 93 mmol/L — ABNORMAL LOW (ref 98–111)
Creatinine, Ser: 0.73 mg/dL (ref 0.61–1.24)
GFR, Estimated: >60 mL/min (ref >60)
Glucose, Bld: 175 mg/dL — ABNORMAL HIGH (ref 70–99)
Potassium: 4.6 mmol/L (ref 3.5–5.1)
Sodium: 137 mmol/L (ref 135–145)

## 2021-01-06 NOTE — Progress Notes (Signed)
Pulmonary Critical Care Medicine [facility] [place]   PULMONARY CRITICAL CARE SERVICE  PROGRESS NOTE  Date of Service: 01/06/2021  Ger Ringenberg  GOT:[id number]  DOB: Aug 27, 1971   DOA: 11/23/2020  Referring Physician: Carron Curie, MD  HPI: Bilbo Carcamo is a 50 y.o. male seen for follow up of Acute on Chronic Respiratory Failure.  Remains on the ventilator no change patient is at baseline on pressure control has been consistently failing attempts at weaning lots of anxiety issues going on  Medications: Reviewed on Rounds  Physical Exam:  Vitals: Temperature 98.1 pulse 78 respiratory rate 20 blood pressure is 137/85 saturations 100%  Ventilator Settings on pressure assist control FiO2 40% IP 22 PEEP five  . General: Comfortable at this time . Eyes: Grossly normal lids, irises & conjunctiva . ENT: grossly tongue is normal . Neck: no obvious mass . Cardiovascular: S1 S2 normal no gallop . Respiratory: No rhonchi very coarse breath sound . Abdomen: soft . Skin: no rash seen on limited exam . Musculoskeletal: not rigid . Psychiatric:unable to assess . Neurologic: no seizure no involuntary movements         Lab Data:   Basic Metabolic Panel: Recent Labs  Lab 12/31/20 1544 01/03/21 0414 01/06/21 0611  NA 134* 137 137  K 4.5 4.7 4.6  CL 90* 92* 93*  CO2 32 33* 29  GLUCOSE 134* 151* 175*  BUN 31* 28* 28*  CREATININE 0.56* 0.77 0.73  CALCIUM 10.0 10.0 9.6  MG 2.4 2.1  --     ABG: No results for input(s): PHART, PCO2ART, PO2ART, HCO3, O2SAT in the last 168 hours.  Liver Function Tests: No results for input(s): AST, ALT, ALKPHOS, BILITOT, PROT, ALBUMIN in the last 168 hours. No results for input(s): LIPASE, AMYLASE in the last 168 hours. No results for input(s): AMMONIA in the last 168 hours.  CBC: Recent Labs  Lab 12/31/20 1544 01/03/21 0414 01/06/21 0611  WBC 21.1* 20.2* 12.8*  HGB 12.1* 11.1* 10.9*  HCT 35.3* 32.7* 33.7*  MCV  96.7 95.6 99.4  PLT 323 309 317    Cardiac Enzymes: No results for input(s): CKTOTAL, CKMB, CKMBINDEX, TROPONINI in the last 168 hours.  BNP (last 3 results) No results for input(s): BNP in the last 8760 hours.  ProBNP (last 3 results) No results for input(s): PROBNP in the last 8760 hours.  Radiological Exams: No results found.  Assessment/Plan Active Problems:   Acute on chronic respiratory failure with hypoxia (HCC)   COVID-19 virus infection   Tracheostomy status (HCC)   Severe sepsis (HCC)   1. Acute on chronic respiratory failure hypoxia plan is to continue with the full support on the ventilator patient is consistently been failing attempts at weaning. 2. COVID-19 virus infection in recovery 3. Tracheostomy remains in place we will continue to follow. 4. Severe sepsis treated and resolved   I have personally seen and evaluated the patient, evaluated laboratory and imaging results, formulated the assessment and plan and placed orders. The Patient requires high complexity decision making with multiple systems involvement.  Rounds were done with the Respiratory Therapy Director and Staff therapists and discussed with nursing staff also.  Yevonne Pax, MD [facility] Pulmonary Critical Care Medicine Sleep Medicine

## 2021-01-07 DIAGNOSIS — J9621 Acute and chronic respiratory failure with hypoxia: Secondary | ICD-10-CM | POA: Diagnosis not present

## 2021-01-07 DIAGNOSIS — U071 COVID-19: Secondary | ICD-10-CM | POA: Diagnosis not present

## 2021-01-07 DIAGNOSIS — Z930 Tracheostomy status: Secondary | ICD-10-CM | POA: Diagnosis not present

## 2021-01-07 DIAGNOSIS — A419 Sepsis, unspecified organism: Secondary | ICD-10-CM | POA: Diagnosis not present

## 2021-01-07 NOTE — Progress Notes (Signed)
Pulmonary Critical Care Medicine [facility] [place]   PULMONARY CRITICAL CARE SERVICE  PROGRESS NOTE  Date of Service: 01/07/2021  Lawrence Freeman  [id number]  DOB: 03/20/1971   DOA: 11/23/2020  Referring Physician: Carron Curie, MD  HPI: Lawrence Freeman is a 50 y.o. male seen for follow up of Acute on Chronic Respiratory Failure.  Patient currently is on full support and pressure control mode has been on 40% FiO2  Medications: Reviewed on Rounds  Physical Exam:  Vitals: Temperature is 97.7 pulse 92 respiratory 26 blood pressure is 112/73 saturations 100%  Ventilator Settings on pressure assist control FiO2 is 40% IP 22 PEEP 5  . General: Comfortable at this time . Eyes: Grossly normal lids, irises & conjunctiva . ENT: grossly tongue is normal . Neck: no obvious mass . Cardiovascular: S1 S2 normal no gallop . Respiratory: Scattered rhonchi expansion is equal . Abdomen: soft . Skin: no rash seen on limited exam . Musculoskeletal: not rigid . Psychiatric:unable to assess . Neurologic: no seizure no involuntary movements         Lab Data:   Basic Metabolic Panel: Recent Labs  Lab 01/03/21 0414 01/06/21 0611  NA 137 137  K 4.7 4.6  CL 92* 93*  CO2 33* 29  GLUCOSE 151* 175*  BUN 28* 28*  CREATININE 0.77 0.73  CALCIUM 10.0 9.6  MG 2.1  --     ABG: No results for input(s): PHART, PCO2ART, PO2ART, HCO3, O2SAT in the last 168 hours.  Liver Function Tests: No results for input(s): AST, ALT, ALKPHOS, BILITOT, PROT, ALBUMIN in the last 168 hours. No results for input(s): LIPASE, AMYLASE in the last 168 hours. No results for input(s): AMMONIA in the last 168 hours.  CBC: Recent Labs  Lab 01/03/21 0414 01/06/21 0611  WBC 20.2* 12.8*  HGB 11.1* 10.9*  HCT 32.7* 33.7*  MCV 95.6 99.4  PLT 309 317    Cardiac Enzymes: No results for input(s): CKTOTAL, CKMB, CKMBINDEX, TROPONINI in the last 168 hours.  BNP (last 3 results) No  results for input(s): BNP in the last 8760 hours.  ProBNP (last 3 results) No results for input(s): PROBNP in the last 8760 hours.  Radiological Exams: No results found.  Assessment/Plan Active Problems:   Acute on chronic respiratory failure with hypoxia (HCC)   COVID-19 virus infection   Tracheostomy status (HCC)   Severe sepsis (HCC)   1. Acute on chronic respiratory failure hypoxia we will continue with full support on pressure control patient has not been tolerating weaning. 2. COVID-19 virus infection recovery phase 3. Tracheostomy remains in place 4. Severe sepsis treated resolved we will continue to monitor   I have personally seen and evaluated the patient, evaluated laboratory and imaging results, formulated the assessment and plan and placed orders. The Patient requires high complexity decision making with multiple systems involvement.  Rounds were done with the Respiratory Therapy Director and Staff therapists and discussed with nursing staff also.  Yevonne Pax, MD [facility] Pulmonary Critical Care Medicine Sleep Medicine

## 2021-01-09 DIAGNOSIS — U071 COVID-19: Secondary | ICD-10-CM | POA: Diagnosis not present

## 2021-01-09 DIAGNOSIS — J9621 Acute and chronic respiratory failure with hypoxia: Secondary | ICD-10-CM | POA: Diagnosis not present

## 2021-01-09 DIAGNOSIS — A419 Sepsis, unspecified organism: Secondary | ICD-10-CM | POA: Diagnosis not present

## 2021-01-09 DIAGNOSIS — Z930 Tracheostomy status: Secondary | ICD-10-CM | POA: Diagnosis not present

## 2021-01-09 NOTE — Progress Notes (Signed)
Pulmonary Critical Care Medicine [facility] [place]   PULMONARY CRITICAL CARE SERVICE  PROGRESS NOTE  Date of Service: 01/09/2021  Lawrence Freeman  [id number]:[id number]  DOB: [birth year]   DOA: 11/23/2020  Referring Physician: Carron Curie, MD  HPI: Lawrence Freeman is a 50 y.o. male seen for follow up of Acute on Chronic Respiratory Failure.  Patient currently is on pressure control has been on 40% FiO2 good saturations are noted.  Medications: Reviewed on Rounds  Physical Exam:  Vitals: Temperature is 97.0 pulse 88 respiratory rate 28 blood pressure is 130/64 saturations 100%  Ventilator Settings on pressure control FiO2 40% IP 22 PEEP 5  . General: Comfortable at this time . Eyes: Grossly normal lids, irises & conjunctiva . ENT: grossly tongue is normal . Neck: no obvious mass . Cardiovascular: S1 S2 normal no gallop . Respiratory: Scattered rhonchi expansion equal . Abdomen: soft . Skin: no rash seen on limited exam . Musculoskeletal: not rigid . Psychiatric:unable to assess . Neurologic: no seizure no involuntary movements         Lab Data:   Basic Metabolic Panel: Recent Labs  Lab 01/03/21 0414 01/06/21 0611  NA 137 137  K 4.7 4.6  CL 92* 93*  CO2 33* 29  GLUCOSE 151* 175*  BUN 28* 28*  CREATININE 0.77 0.73  CALCIUM 10.0 9.6  MG 2.1  --     ABG: No results for input(s): PHART, PCO2ART, PO2ART, HCO3, O2SAT in the last 168 hours.  Liver Function Tests: No results for input(s): AST, ALT, ALKPHOS, BILITOT, PROT, ALBUMIN in the last 168 hours. No results for input(s): LIPASE, AMYLASE in the last 168 hours. No results for input(s): AMMONIA in the last 168 hours.  CBC: Recent Labs  Lab 01/03/21 0414 01/06/21 0611  WBC 20.2* 12.8*  HGB 11.1* 10.9*  HCT 32.7* 33.7*  MCV 95.6 99.4  PLT 309 317    Cardiac Enzymes: No results for input(s): CKTOTAL, CKMB, CKMBINDEX, TROPONINI in the last 168 hours.  BNP (last 3 results) No results  for input(s): BNP in the last 8760 hours.  ProBNP (last 3 results) No results for input(s): PROBNP in the last 8760 hours.  Radiological Exams: No results found.  Assessment/Plan Active Problems:   Acute on chronic respiratory failure with hypoxia (HCC)   COVID-19 virus infection   Tracheostomy status (HCC)   Severe sepsis (HCC)   1. Acute on chronic respiratory failure hypoxia we will continue on pressure control titrate oxygen continue pulmonary toilet patient's not able to do much in the way of advancing on weaning 2. COVID-19 virus infection in recovery 3. Severe sepsis resolved 4. Tracheostomy remains in place   I have personally seen and evaluated the patient, evaluated laboratory and imaging results, formulated the assessment and plan and placed orders. The Patient requires high complexity decision making with multiple systems involvement.  Rounds were done with the Respiratory Therapy Director and Staff therapists and discussed with nursing staff also.  Yevonne Pax, MD [facility] Pulmonary Critical Care Medicine Sleep Medicine

## 2021-01-10 DIAGNOSIS — Z930 Tracheostomy status: Secondary | ICD-10-CM | POA: Diagnosis not present

## 2021-01-10 DIAGNOSIS — U071 COVID-19: Secondary | ICD-10-CM | POA: Diagnosis not present

## 2021-01-10 DIAGNOSIS — J9621 Acute and chronic respiratory failure with hypoxia: Secondary | ICD-10-CM | POA: Diagnosis not present

## 2021-01-10 DIAGNOSIS — A419 Sepsis, unspecified organism: Secondary | ICD-10-CM | POA: Diagnosis not present

## 2021-01-10 NOTE — Progress Notes (Signed)
Pulmonary Critical Care Medicine [facility] [place]   PULMONARY CRITICAL CARE SERVICE  PROGRESS NOTE  Date of Service: 01/10/2021  Lawrence Freeman  [id number]  DOB: [birth year]   DOA: 11/23/2020  Referring Physician: Carron Curie, MD  HPI: Lawrence Freeman is a 50 y.o. male seen for follow up of Acute on Chronic Respiratory Failure.  Patient is on the ventilator on pressure control mode 40% FiO2  Medications: Reviewed on Rounds  Physical Exam:  Vitals: Temperature is 96.5 pulse 88 respiratory 20 blood pressure is 127/86 saturations 98%  Ventilator Settings on pressure control FiO2 40% PEEP 5 IP 22  . General: Comfortable at this time . Eyes: Grossly normal lids, irises & conjunctiva . ENT: grossly tongue is normal . Neck: no obvious mass . Cardiovascular: S1 S2 normal no gallop . Respiratory: No rhonchi very coarse breath sound . Abdomen: soft . Skin: no rash seen on limited exam . Musculoskeletal: not rigid . Psychiatric:unable to assess . Neurologic: no seizure no involuntary movements         Lab Data:   Basic Metabolic Panel: Recent Labs  Lab 01/06/21 0611  NA 137  K 4.6  CL 93*  CO2 29  GLUCOSE 175*  BUN 28*  CREATININE 0.73  CALCIUM 9.6    ABG: No results for input(s): PHART, PCO2ART, PO2ART, HCO3, O2SAT in the last 168 hours.  Liver Function Tests: No results for input(s): AST, ALT, ALKPHOS, BILITOT, PROT, ALBUMIN in the last 168 hours. No results for input(s): LIPASE, AMYLASE in the last 168 hours. No results for input(s): AMMONIA in the last 168 hours.  CBC: Recent Labs  Lab 01/06/21 0611  WBC 12.8*  HGB 10.9*  HCT 33.7*  MCV 99.4  PLT 317    Cardiac Enzymes: No results for input(s): CKTOTAL, CKMB, CKMBINDEX, TROPONINI in the last 168 hours.  BNP (last 3 results) No results for input(s): BNP in the last 8760 hours.  ProBNP (last 3 results) No results for input(s): PROBNP in the last 8760  hours.  Radiological Exams: No results found.  Assessment/Plan Active Problems:   Acute on chronic respiratory failure with hypoxia (HCC)   COVID-19 virus infection   Tracheostomy status (HCC)   Severe sepsis (HCC)   1. Acute on chronic respiratory failure with hypoxia we will continue with pressure control mode on 40% FiO2 with a PEEP of 5 currently IP is 22 2. COVID-19 virus infection recovery phase we will continue to follow 3. Severe sepsis treated resolved 4. Tracheostomy remains in place   I have personally seen and evaluated the patient, evaluated laboratory and imaging results, formulated the assessment and plan and placed orders. The Patient requires high complexity decision making with multiple systems involvement.  Rounds were done with the Respiratory Therapy Director and Staff therapists and discussed with nursing staff also.  Yevonne Pax, MD [facility] Pulmonary Critical Care Medicine Sleep Medicine

## 2021-01-11 DIAGNOSIS — J9621 Acute and chronic respiratory failure with hypoxia: Secondary | ICD-10-CM | POA: Diagnosis not present

## 2021-01-11 DIAGNOSIS — A419 Sepsis, unspecified organism: Secondary | ICD-10-CM | POA: Diagnosis not present

## 2021-01-11 DIAGNOSIS — Z930 Tracheostomy status: Secondary | ICD-10-CM | POA: Diagnosis not present

## 2021-01-11 DIAGNOSIS — U071 COVID-19: Secondary | ICD-10-CM | POA: Diagnosis not present

## 2021-01-11 NOTE — Progress Notes (Signed)
Pulmonary Critical Care Medicine [facility] [place]   PULMONARY CRITICAL CARE SERVICE  PROGRESS NOTE  Date of Service: 01/11/2021  Lawrence Freeman  [id number]  DOB: [birth year]   DOA: 11/23/2020  Referring Physician: Carron Curie, MD  HPI: Lawrence Freeman is a 50 y.o. male seen for follow up of Acute on Chronic Respiratory Failure.  Patient currently is on pressure control has been on 40% FiO2  Medications: Reviewed on Rounds  Physical Exam:  Vitals: Temperature 98.5 pulse 115 respiratory rate 28 blood pressure is 121/78 saturations 100%  Ventilator Settings on pressure control FiO2 is 40% PEEP of 5 IP 23  . General: Comfortable at this time . Eyes: Grossly normal lids, irises & conjunctiva . ENT: grossly tongue is normal . Neck: no obvious mass . Cardiovascular: S1 S2 normal no gallop . Respiratory: Scattered rhonchi expansion is equal . Abdomen: soft . Skin: no rash seen on limited exam . Musculoskeletal: not rigid . Psychiatric:unable to assess . Neurologic: no seizure no involuntary movements         Lab Data:   Basic Metabolic Panel: Recent Labs  Lab 01/06/21 0611  NA 137  K 4.6  CL 93*  CO2 29  GLUCOSE 175*  BUN 28*  CREATININE 0.73  CALCIUM 9.6    ABG: No results for input(s): PHART, PCO2ART, PO2ART, HCO3, O2SAT in the last 168 hours.  Liver Function Tests: No results for input(s): AST, ALT, ALKPHOS, BILITOT, PROT, ALBUMIN in the last 168 hours. No results for input(s): LIPASE, AMYLASE in the last 168 hours. No results for input(s): AMMONIA in the last 168 hours.  CBC: Recent Labs  Lab 01/06/21 0611  WBC 12.8*  HGB 10.9*  HCT 33.7*  MCV 99.4  PLT 317    Cardiac Enzymes: No results for input(s): CKTOTAL, CKMB, CKMBINDEX, TROPONINI in the last 168 hours.  BNP (last 3 results) No results for input(s): BNP in the last 8760 hours.  ProBNP (last 3 results) No results for input(s): PROBNP in the last 8760  hours.  Radiological Exams: No results found.  Assessment/Plan Active Problems:   Acute on chronic respiratory failure with hypoxia (HCC)   COVID-19 virus infection   Tracheostomy status (HCC)   Severe sepsis (HCC)   1. Acute on chronic respiratory failure with hypoxia we will continue with pressure control and weaning attempts have been made not successful however 2. COVID-19 virus infection in recovery 3. Tracheostomy remains in place 4. Severe sepsis treated resolved   I have personally seen and evaluated the patient, evaluated laboratory and imaging results, formulated the assessment and plan and placed orders. The Patient requires high complexity decision making with multiple systems involvement.  Rounds were done with the Respiratory Therapy Director and Staff therapists and discussed with nursing staff also.  Yevonne Pax, MD [facility] Pulmonary Critical Care Medicine Sleep Medicine

## 2021-01-12 DIAGNOSIS — J9621 Acute and chronic respiratory failure with hypoxia: Secondary | ICD-10-CM | POA: Diagnosis not present

## 2021-01-12 DIAGNOSIS — A419 Sepsis, unspecified organism: Secondary | ICD-10-CM | POA: Diagnosis not present

## 2021-01-12 DIAGNOSIS — U071 COVID-19: Secondary | ICD-10-CM | POA: Diagnosis not present

## 2021-01-12 DIAGNOSIS — Z930 Tracheostomy status: Secondary | ICD-10-CM | POA: Diagnosis not present

## 2021-01-12 NOTE — Progress Notes (Signed)
Pulmonary Critical Care Medicine [facility] [place]   PULMONARY CRITICAL CARE SERVICE  PROGRESS NOTE  Date of Service: 01/12/2021  Lawrence Freeman  [id number]  DOB: [birth year]   DOA: 11/23/2020  Referring Physician: Carron Curie, MD  HPI: Lawrence Freeman is a 50 y.o. male seen for follow up of Acute on Chronic Respiratory Failure.  Patient is comfortable had the tracheostomy changed because of air leak right now is on the ventilator and full support and pressure control mode  Medications: Reviewed on Rounds  Physical Exam:  Vitals: Temperature is 97.6 pulse 88 respiratory rate is 20 blood pressure is 102/80 saturations 95%  Ventilator Settings on pressure control FiO2 is 40% PEEP 5 IP 22  . General: Comfortable at this time . Eyes: Grossly normal lids, irises & conjunctiva . ENT: grossly tongue is normal . Neck: no obvious mass . Cardiovascular: S1 S2 normal no gallop . Respiratory: Scattered rhonchi very coarse breath sound . Abdomen: soft . Skin: no rash seen on limited exam . Musculoskeletal: not rigid . Psychiatric:unable to assess . Neurologic: no seizure no involuntary movements         Lab Data:   Basic Metabolic Panel: Recent Labs  Lab 01/06/21 0611  NA 137  K 4.6  CL 93*  CO2 29  GLUCOSE 175*  BUN 28*  CREATININE 0.73  CALCIUM 9.6    ABG: No results for input(s): PHART, PCO2ART, PO2ART, HCO3, O2SAT in the last 168 hours.  Liver Function Tests: No results for input(s): AST, ALT, ALKPHOS, BILITOT, PROT, ALBUMIN in the last 168 hours. No results for input(s): LIPASE, AMYLASE in the last 168 hours. No results for input(s): AMMONIA in the last 168 hours.  CBC: Recent Labs  Lab 01/06/21 0611  WBC 12.8*  HGB 10.9*  HCT 33.7*  MCV 99.4  PLT 317    Cardiac Enzymes: No results for input(s): CKTOTAL, CKMB, CKMBINDEX, TROPONINI in the last 168 hours.  BNP (last 3 results) No results for input(s): BNP in the last 8760  hours.  ProBNP (last 3 results) No results for input(s): PROBNP in the last 8760 hours.  Radiological Exams: No results found.  Assessment/Plan Active Problems:   Acute on chronic respiratory failure with hypoxia (HCC)   COVID-19 virus infection   Tracheostomy status (HCC)   Severe sepsis (HCC)   1. Acute on chronic respiratory failure with hypoxia we will continue with pressure control mode titrate oxygen continue pulmonary toilet. 2. COVID-19 virus infection in recovery phase 3. Tracheostomy had to be changed because of leak 4. Severe sepsis treated   I have personally seen and evaluated the patient, evaluated laboratory and imaging results, formulated the assessment and plan and placed orders. The Patient requires high complexity decision making with multiple systems involvement.  Rounds were done with the Respiratory Therapy Director and Staff therapists and discussed with nursing staff also.  Yevonne Pax, MD [facility] Pulmonary Critical Care Medicine Sleep Medicine

## 2021-01-13 DIAGNOSIS — U071 COVID-19: Secondary | ICD-10-CM | POA: Diagnosis not present

## 2021-01-13 DIAGNOSIS — J9621 Acute and chronic respiratory failure with hypoxia: Secondary | ICD-10-CM | POA: Diagnosis not present

## 2021-01-13 DIAGNOSIS — A419 Sepsis, unspecified organism: Secondary | ICD-10-CM | POA: Diagnosis not present

## 2021-01-13 DIAGNOSIS — Z930 Tracheostomy status: Secondary | ICD-10-CM | POA: Diagnosis not present

## 2021-01-13 LAB — BASIC METABOLIC PANEL
Anion gap: 11 (ref 5–15)
BUN: 46 mg/dL — ABNORMAL HIGH (ref 6–20)
CO2: 31 mmol/L (ref 22–32)
Calcium: 9.7 mg/dL (ref 8.9–10.3)
Chloride: 98 mmol/L (ref 98–111)
Creatinine, Ser: 0.66 mg/dL (ref 0.61–1.24)
GFR, Estimated: >60 mL/min (ref >60)
Glucose, Bld: 155 mg/dL — ABNORMAL HIGH (ref 70–99)
Potassium: 4.3 mmol/L (ref 3.5–5.1)
Sodium: 140 mmol/L (ref 135–145)

## 2021-01-13 LAB — CBC
HCT: 30 % — ABNORMAL LOW (ref 39.0–52.0)
Hemoglobin: 9.6 g/dL — ABNORMAL LOW (ref 13.0–17.0)
MCH: 31.8 pg (ref 26.0–34.0)
MCHC: 32 g/dL (ref 30.0–36.0)
MCV: 99.3 fL (ref 80.0–100.0)
Platelets: 250 10*3/uL (ref 150–400)
RBC: 3.02 MIL/uL — ABNORMAL LOW (ref 4.22–5.81)
RDW: 14.8 % (ref 11.5–15.5)
WBC: 12.4 10*3/uL — ABNORMAL HIGH (ref 4.0–10.5)
nRBC: 0 % (ref 0.0–0.2)

## 2021-01-13 LAB — MAGNESIUM: Magnesium: 1.9 mg/dL (ref 1.7–2.4)

## 2021-01-13 NOTE — Progress Notes (Signed)
Pulmonary Critical Care Medicine [facility] [place]   PULMONARY CRITICAL CARE SERVICE  PROGRESS NOTE  Date of Service: 01/13/2021  Trust Leh  [id number]  DOB: Apr 22, 1971   DOA: 11/23/2020  Referring Physician: Carron Curie, MD  HPI: Lawrence Freeman is a 50 y.o. male seen for follow up of Acute on Chronic Respiratory Failure.  Patient right now is on pressure control mode has been on 40% FiO2 still not tolerating the weaning  Medications: Reviewed on Rounds  Physical Exam:  Vitals: Temperature 98.6 pulse 92 respiratory 24 blood pressure is 107/69 saturations 100%  Ventilator Settings on pressure assist control FiO2 40% IP 22 PEEP 5  . General: Comfortable at this time . Eyes: Grossly normal lids, irises & conjunctiva . ENT: grossly tongue is normal . Neck: no obvious mass . Cardiovascular: S1 S2 normal no gallop . Respiratory: No rhonchi coarse breath sound . Abdomen: soft . Skin: no rash seen on limited exam . Musculoskeletal: not rigid . Psychiatric:unable to assess . Neurologic: no seizure no involuntary movements         Lab Data:   Basic Metabolic Panel: Recent Labs  Lab 01/13/21 0616  NA 140  K 4.3  CL 98  CO2 31  GLUCOSE 155*  BUN 46*  CREATININE 0.66  CALCIUM 9.7  MG 1.9    ABG: No results for input(s): PHART, PCO2ART, PO2ART, HCO3, O2SAT in the last 168 hours.  Liver Function Tests: No results for input(s): AST, ALT, ALKPHOS, BILITOT, PROT, ALBUMIN in the last 168 hours. No results for input(s): LIPASE, AMYLASE in the last 168 hours. No results for input(s): AMMONIA in the last 168 hours.  CBC: Recent Labs  Lab 01/13/21 0616  WBC 12.4*  HGB 9.6*  HCT 30.0*  MCV 99.3  PLT 250    Cardiac Enzymes: No results for input(s): CKTOTAL, CKMB, CKMBINDEX, TROPONINI in the last 168 hours.  BNP (last 3 results) No results for input(s): BNP in the last 8760 hours.  ProBNP (last 3 results) No results for  input(s): PROBNP in the last 8760 hours.  Radiological Exams: No results found.  Assessment/Plan Active Problems:   Acute on chronic respiratory failure with hypoxia (HCC)   COVID-19 virus infection   Tracheostomy status (HCC)   Severe sepsis (HCC)   1. Acute on chronic respiratory failure with hypoxia right now on pressure control mode full support 2. COVID-19 virus infection in resolution 3. Tracheostomy remains in place 4. Severe sepsis treated and resolved   I have personally seen and evaluated the patient, evaluated laboratory and imaging results, formulated the assessment and plan and placed orders. The Patient requires high complexity decision making with multiple systems involvement.  Rounds were done with the Respiratory Therapy Director and Staff therapists and discussed with nursing staff also.  Yevonne Pax, MD [facility] Pulmonary Critical Care Medicine Sleep Medicine

## 2021-01-14 DIAGNOSIS — Z930 Tracheostomy status: Secondary | ICD-10-CM | POA: Diagnosis not present

## 2021-01-14 DIAGNOSIS — A419 Sepsis, unspecified organism: Secondary | ICD-10-CM | POA: Diagnosis not present

## 2021-01-14 DIAGNOSIS — J9621 Acute and chronic respiratory failure with hypoxia: Secondary | ICD-10-CM | POA: Diagnosis not present

## 2021-01-14 DIAGNOSIS — U071 COVID-19: Secondary | ICD-10-CM | POA: Diagnosis not present

## 2021-01-14 NOTE — Progress Notes (Signed)
Pulmonary Critical Care Medicine [facility] [place]   PULMONARY CRITICAL CARE SERVICE  PROGRESS NOTE  Date of Service: 01/14/2021  Issiac Jamar  [id number]  DOB: [birth year]   DOA: 11/23/2020  Referring Physician: Carron Curie, MD  HPI: Virgle Arth is a 50 y.o. male seen for follow up of Acute on Chronic Respiratory Failure.  Patient is on pressure assist control on full support currently on 40% FiO2 good saturations are noted  Medications: Reviewed on Rounds  Physical Exam:  Vitals: Temperature 97.1 pulse 82 respiratory rate 30 blood pressure is 106/89 saturations 100  Ventilator Settings on pressure assist control FiO2 is 40% IP 22 PEEP 5  . General: Comfortable at this time . Eyes: Grossly normal lids, irises & conjunctiva . ENT: grossly tongue is normal . Neck: no obvious mass . Cardiovascular: S1 S2 normal no gallop . Respiratory: Coarse rhonchi expansion is equal at this time . Abdomen: soft . Skin: no rash seen on limited exam . Musculoskeletal: not rigid . Psychiatric:unable to assess . Neurologic: no seizure no involuntary movements         Lab Data:   Basic Metabolic Panel: Recent Labs  Lab 01/13/21 0616  NA 140  K 4.3  CL 98  CO2 31  GLUCOSE 155*  BUN 46*  CREATININE 0.66  CALCIUM 9.7  MG 1.9    ABG: No results for input(s): PHART, PCO2ART, PO2ART, HCO3, O2SAT in the last 168 hours.  Liver Function Tests: No results for input(s): AST, ALT, ALKPHOS, BILITOT, PROT, ALBUMIN in the last 168 hours. No results for input(s): LIPASE, AMYLASE in the last 168 hours. No results for input(s): AMMONIA in the last 168 hours.  CBC: Recent Labs  Lab 01/13/21 0616  WBC 12.4*  HGB 9.6*  HCT 30.0*  MCV 99.3  PLT 250    Cardiac Enzymes: No results for input(s): CKTOTAL, CKMB, CKMBINDEX, TROPONINI in the last 168 hours.  BNP (last 3 results) No results for input(s): BNP in the last 8760 hours.  ProBNP (last 3  results) No results for input(s): PROBNP in the last 8760 hours.  Radiological Exams: No results found.  Assessment/Plan Active Problems:   Acute on chronic respiratory failure with hypoxia (HCC)   COVID-19 virus infection   Tracheostomy status (HCC)   Severe sepsis (HCC)   1. Acute on chronic respiratory failure hypoxia we will continue with pressure control titrate oxygen continue pulmonary toilet 2. COVID-19 virus infection in recovery phase 3. Severe sepsis treated and resolved 4. Tracheostomy remains in place at this time   I have personally seen and evaluated the patient, evaluated laboratory and imaging results, formulated the assessment and plan and placed orders. The Patient requires high complexity decision making with multiple systems involvement.  Rounds were done with the Respiratory Therapy Director and Staff therapists and discussed with nursing staff also.  Yevonne Pax, MD [facility] Pulmonary Critical Care Medicine Sleep Medicine

## 2021-01-15 DIAGNOSIS — J9621 Acute and chronic respiratory failure with hypoxia: Secondary | ICD-10-CM | POA: Diagnosis not present

## 2021-01-15 DIAGNOSIS — Z930 Tracheostomy status: Secondary | ICD-10-CM | POA: Diagnosis not present

## 2021-01-15 DIAGNOSIS — U071 COVID-19: Secondary | ICD-10-CM | POA: Diagnosis not present

## 2021-01-15 DIAGNOSIS — A419 Sepsis, unspecified organism: Secondary | ICD-10-CM | POA: Diagnosis not present

## 2021-01-15 NOTE — Progress Notes (Signed)
Pulmonary Critical Care Medicine [facility] [place]   PULMONARY CRITICAL CARE SERVICE  PROGRESS NOTE  Date of Service: 01/15/2021  Lawrence Freeman  [id number]  DOB: 02/21/1971   DOA: 11/23/2020  Referring Physician: Carron Curie, MD  HPI: Lawrence Freeman is a 50 y.o. male seen for follow up of Acute on Chronic Respiratory Failure.  Patient currently is on full support on pressure control mode has been on 40% FiO2  Medications: Reviewed on Rounds  Physical Exam:  Vitals: Temperature is 96.5 pulse 75 respiratory 23 blood pressure 109/61 saturations 100%  Ventilator Settings on pressure assist control FiO2 40% IP 22 PEEP 5  . General: Comfortable at this time . Eyes: Grossly normal lids, irises & conjunctiva . ENT: grossly tongue is normal . Neck: no obvious mass . Cardiovascular: S1 S2 normal no gallop . Respiratory: Scattered rhonchi expansion is equal . Abdomen: soft . Skin: no rash seen on limited exam . Musculoskeletal: not rigid . Psychiatric:unable to assess . Neurologic: no seizure no involuntary movements         Lab Data:   Basic Metabolic Panel: Recent Labs  Lab 01/13/21 0616  NA 140  K 4.3  CL 98  CO2 31  GLUCOSE 155*  BUN 46*  CREATININE 0.66  CALCIUM 9.7  MG 1.9    ABG: No results for input(s): PHART, PCO2ART, PO2ART, HCO3, O2SAT in the last 168 hours.  Liver Function Tests: No results for input(s): AST, ALT, ALKPHOS, BILITOT, PROT, ALBUMIN in the last 168 hours. No results for input(s): LIPASE, AMYLASE in the last 168 hours. No results for input(s): AMMONIA in the last 168 hours.  CBC: Recent Labs  Lab 01/13/21 0616  WBC 12.4*  HGB 9.6*  HCT 30.0*  MCV 99.3  PLT 250    Cardiac Enzymes: No results for input(s): CKTOTAL, CKMB, CKMBINDEX, TROPONINI in the last 168 hours.  BNP (last 3 results) No results for input(s): BNP in the last 8760 hours.  ProBNP (last 3 results) No results for input(s): PROBNP in  the last 8760 hours.  Radiological Exams: No results found.  Assessment/Plan Active Problems:   Acute on chronic respiratory failure with hypoxia (HCC)   COVID-19 virus infection   Tracheostomy status (HCC)   Severe sepsis (HCC)   1. Acute on chronic respiratory failure with hypoxia we will continue with pressure control mode titrate oxygen as tolerated continue pulmonary toilet. 2. COVID-19 virus infection in recovery we will continue to follow along 3. Tracheostomy remains in place 4. Severe sepsis treated resolved   I have personally seen and evaluated the patient, evaluated laboratory and imaging results, formulated the assessment and plan and placed orders. The Patient requires high complexity decision making with multiple systems involvement.  Rounds were done with the Respiratory Therapy Director and Staff therapists and discussed with nursing staff also.  Yevonne Pax, MD [facility] Pulmonary Critical Care Medicine Sleep Medicine

## 2021-01-16 ENCOUNTER — Other Ambulatory Visit (HOSPITAL_COMMUNITY): Payer: Self-pay

## 2021-01-16 DIAGNOSIS — A419 Sepsis, unspecified organism: Secondary | ICD-10-CM | POA: Diagnosis not present

## 2021-01-16 DIAGNOSIS — J9621 Acute and chronic respiratory failure with hypoxia: Secondary | ICD-10-CM | POA: Diagnosis not present

## 2021-01-16 DIAGNOSIS — Z930 Tracheostomy status: Secondary | ICD-10-CM | POA: Diagnosis not present

## 2021-01-16 DIAGNOSIS — U071 COVID-19: Secondary | ICD-10-CM | POA: Diagnosis not present

## 2021-01-16 LAB — BASIC METABOLIC PANEL
Anion gap: 12 (ref 5–15)
BUN: 47 mg/dL — ABNORMAL HIGH (ref 6–20)
CO2: 30 mmol/L (ref 22–32)
Calcium: 9.6 mg/dL (ref 8.9–10.3)
Chloride: 95 mmol/L — ABNORMAL LOW (ref 98–111)
Creatinine, Ser: 0.64 mg/dL (ref 0.61–1.24)
GFR, Estimated: >60 mL/min (ref >60)
Glucose, Bld: 128 mg/dL — ABNORMAL HIGH (ref 70–99)
Potassium: 4.4 mmol/L (ref 3.5–5.1)
Sodium: 137 mmol/L (ref 135–145)

## 2021-01-16 LAB — CBC
HCT: 33.4 % — ABNORMAL LOW (ref 39.0–52.0)
Hemoglobin: 10.7 g/dL — ABNORMAL LOW (ref 13.0–17.0)
MCH: 31.2 pg (ref 26.0–34.0)
MCHC: 32 g/dL (ref 30.0–36.0)
MCV: 97.4 fL (ref 80.0–100.0)
Platelets: 268 10*3/uL (ref 150–400)
RBC: 3.43 MIL/uL — ABNORMAL LOW (ref 4.22–5.81)
RDW: 14.5 % (ref 11.5–15.5)
WBC: 11.8 10*3/uL — ABNORMAL HIGH (ref 4.0–10.5)
nRBC: 0 % (ref 0.0–0.2)

## 2021-01-16 LAB — MAGNESIUM: Magnesium: 1.8 mg/dL (ref 1.7–2.4)

## 2021-01-16 NOTE — Progress Notes (Signed)
Pulmonary Critical Care Medicine [facility] [place]   PULMONARY CRITICAL CARE SERVICE  PROGRESS NOTE  Date of Service: 01/16/2021  Lawrence Freeman  [id number]  DOB: 1971-06-09   DOA: 11/23/2020  Referring Physician: Carron Curie, MD  HPI: Lawrence Freeman is a 50 y.o. male seen for follow up of Acute on Chronic Respiratory Failure.  Patient at this time is full support on the ventilator.  Still having issues with leaking in the tracheostomy waiting for ENT to evaluate evaluate  Medications: Reviewed on Rounds  Physical Exam:  Vitals: Temperature 95.4 pulse 84 respiratory rate 28 blood pressure is 113/66 saturations 100%  Ventilator Settings on pressure control FiO2 40% IP 22 PEEP 5  . General: Comfortable at this time . Eyes: Grossly normal lids, irises & conjunctiva . ENT: grossly tongue is normal . Neck: no obvious mass . Cardiovascular: S1 S2 normal no gallop . Respiratory: Scattered rhonchi expansion is equal . Abdomen: soft . Skin: no rash seen on limited exam . Musculoskeletal: not rigid . Psychiatric:unable to assess . Neurologic: no seizure no involuntary movements         Lab Data:   Basic Metabolic Panel: Recent Labs  Lab 01/13/21 0616  NA 140  K 4.3  CL 98  CO2 31  GLUCOSE 155*  BUN 46*  CREATININE 0.66  CALCIUM 9.7  MG 1.9    ABG: No results for input(s): PHART, PCO2ART, PO2ART, HCO3, O2SAT in the last 168 hours.  Liver Function Tests: No results for input(s): AST, ALT, ALKPHOS, BILITOT, PROT, ALBUMIN in the last 168 hours. No results for input(s): LIPASE, AMYLASE in the last 168 hours. No results for input(s): AMMONIA in the last 168 hours.  CBC: Recent Labs  Lab 01/13/21 0616  WBC 12.4*  HGB 9.6*  HCT 30.0*  MCV 99.3  PLT 250    Cardiac Enzymes: No results for input(s): CKTOTAL, CKMB, CKMBINDEX, TROPONINI in the last 168 hours.  BNP (last 3 results) No results for input(s): BNP in the last 8760  hours.  ProBNP (last 3 results) No results for input(s): PROBNP in the last 8760 hours.  Radiological Exams: No results found.  Assessment/Plan Active Problems:   Acute on chronic respiratory failure with hypoxia (HCC)   COVID-19 virus infection   Tracheostomy status (HCC)   Severe sepsis (HCC)   1. Acute on chronic respiratory failure hypoxia we will continue with pressure control titrate oxygen continue pulmonary toilet. 2. COVID-19 virus infection in recovery we will continue with supportive care 3. Tracheostomy remains in place 4. Severe sepsis resolved   I have personally seen and evaluated the patient, evaluated laboratory and imaging results, formulated the assessment and plan and placed orders. The Patient requires high complexity decision making with multiple systems involvement.  Rounds were done with the Respiratory Therapy Director and Staff therapists and discussed with nursing staff also.  Yevonne Pax, MD [facility] Pulmonary Critical Care Medicine Sleep Medicine

## 2021-01-17 DIAGNOSIS — J9621 Acute and chronic respiratory failure with hypoxia: Secondary | ICD-10-CM | POA: Diagnosis not present

## 2021-01-17 DIAGNOSIS — U071 COVID-19: Secondary | ICD-10-CM | POA: Diagnosis not present

## 2021-01-17 DIAGNOSIS — Z930 Tracheostomy status: Secondary | ICD-10-CM | POA: Diagnosis not present

## 2021-01-17 DIAGNOSIS — A419 Sepsis, unspecified organism: Secondary | ICD-10-CM | POA: Diagnosis not present

## 2021-01-17 NOTE — Progress Notes (Signed)
Pulmonary Critical Care Medicine [facility] [place]   PULMONARY CRITICAL CARE SERVICE  PROGRESS NOTE  Date of Service: 01/17/2021  Lawrence Freeman  [id number]  DOB: Oct 27, 1971   DOA: 11/23/2020  Referring Physician: Carron Curie, MD  HPI: [name] is a 50 y.o. male seen for follow up of Acute on Chronic Respiratory Failure.  Patient currently is on pressure control mode has been on 40% FiO2 with an IP of 22 PEEP of 5  Medications: Reviewed on Rounds  Physical Exam:  Vitals: Temperature is 96.2 pulse 72 respiratory rate is 12 blood pressure 158/54 saturations 98  Ventilator Settings on pressure control FiO2 40% IP 22 PEEP 5  . General: Comfortable at this time . Eyes: Grossly normal lids, irises & conjunctiva . ENT: grossly tongue is normal . Neck: no obvious mass . Cardiovascular: S1 S2 normal no gallop . Respiratory: Scattered rhonchi expansion is equal . Abdomen: soft . Skin: no rash seen on limited exam . Musculoskeletal: not rigid . Psychiatric:unable to assess . Neurologic: no seizure no involuntary movements         Lab Data:   Basic Metabolic Panel: Recent Labs  Lab 01/13/21 0616 01/16/21 0859  NA 140 137  K 4.3 4.4  CL 98 95*  CO2 31 30  GLUCOSE 155* 128*  BUN 46* 47*  CREATININE 0.66 0.64  CALCIUM 9.7 9.6  MG 1.9 1.8    ABG: No results for input(s): PHART, PCO2ART, PO2ART, HCO3, O2SAT in the last 168 hours.  Liver Function Tests: No results for input(s): AST, ALT, ALKPHOS, BILITOT, PROT, ALBUMIN in the last 168 hours. No results for input(s): LIPASE, AMYLASE in the last 168 hours. No results for input(s): AMMONIA in the last 168 hours.  CBC: Recent Labs  Lab 01/13/21 0616 01/16/21 0859  WBC 12.4* 11.8*  HGB 9.6* 10.7*  HCT 30.0* 33.4*  MCV 99.3 97.4  PLT 250 268    Cardiac Enzymes: No results for input(s): CKTOTAL, CKMB, CKMBINDEX, TROPONINI in the last 168 hours.  BNP (last 3 results) No results  for input(s): BNP in the last 8760 hours.  ProBNP (last 3 results) No results for input(s): PROBNP in the last 8760 hours.  Radiological Exams: DG CHEST PORT 1 VIEW  Result Date: 01/16/2021 CLINICAL DATA:  Rhonchi EXAM: PORTABLE CHEST 1 VIEW COMPARISON:  12/29/2020 FINDINGS: Tracheostomy has been changed and is in good position. Decreased lung volume. Patchy bilateral airspace disease similar to the prior study most prominent the bases. IMPRESSION: Mild bilateral infiltrates unchanged.  No new finding. Electronically Signed   By: Marlan Palau M.D.   On: 01/16/2021 13:07    Assessment/Plan Active Problems:   Acute on chronic respiratory failure with hypoxia (HCC)   COVID-19 virus infection   Tracheostomy status (HCC)   Severe sepsis (HCC)   1. Acute on chronic respiratory failure hypoxia we will continue with pressure control titrate oxygen continue pulmonary toilet. 2. Chronic Covid virus infection and lung disease supportive care 3. Tracheostomy remains in place 4. Severe sepsis resolved 5. Anxiety continues to be a major hurdle   I have personally seen and evaluated the patient, evaluated laboratory and imaging results, formulated the assessment and plan and placed orders. The Patient requires high complexity decision making with multiple systems involvement.  Rounds were done with the Respiratory Therapy Director and Staff therapists and discussed with nursing staff also.  Yevonne Pax, MD [facility] Pulmonary Critical Care Medicine Sleep Medicine

## 2021-01-18 DIAGNOSIS — J9620 Acute and chronic respiratory failure, unspecified whether with hypoxia or hypercapnia: Secondary | ICD-10-CM

## 2021-01-19 DIAGNOSIS — U071 COVID-19: Secondary | ICD-10-CM | POA: Diagnosis not present

## 2021-01-19 DIAGNOSIS — Z930 Tracheostomy status: Secondary | ICD-10-CM | POA: Diagnosis not present

## 2021-01-19 DIAGNOSIS — A419 Sepsis, unspecified organism: Secondary | ICD-10-CM | POA: Diagnosis not present

## 2021-01-19 DIAGNOSIS — J9621 Acute and chronic respiratory failure with hypoxia: Secondary | ICD-10-CM | POA: Diagnosis not present

## 2021-01-19 NOTE — Progress Notes (Signed)
Pulmonary Critical Care Medicine [facility] [place]   PULMONARY CRITICAL CARE SERVICE  PROGRESS NOTE  Date of Service: 01/19/2021  Lawrence Freeman  [id number]:[id number]  DOB: 1971/04/14   DOA: 11/23/2020  Referring Physician: Carron Curie, MD  HPI: Lawrence Freeman is a 50 y.o. male seen for follow up of Acute on Chronic Respiratory Failure.  Patient currently is on pressure control mode has been on 40% FiO2 IP 22 with a PEEP of 5  Medications: Reviewed on Rounds  Physical Exam:  Vitals: Temperature is 97.4 pulse 78 respiratory rate 16 blood pressure is 99/56 saturations 100%  Ventilator Settings on pressure control FiO2 40% PEEP 5 IP 22   General: Comfortable at this time  Eyes: Grossly normal lids, irises & conjunctiva  ENT: grossly tongue is normal  Neck: no obvious mass  Cardiovascular: S1 S2 normal no gallop  Respiratory: Coarse rhonchi expansion is equal  Abdomen: soft  Skin: no rash seen on limited exam  Musculoskeletal: not rigid  Psychiatric:unable to assess  Neurologic: no seizure no involuntary movements         Lab Data:   Basic Metabolic Panel: Recent Labs  Lab 01/13/21 0616 01/16/21 0859  NA 140 137  K 4.3 4.4  CL 98 95*  CO2 31 30  GLUCOSE 155* 128*  BUN 46* 47*  CREATININE 0.66 0.64  CALCIUM 9.7 9.6  MG 1.9 1.8    ABG: No results for input(s): PHART, PCO2ART, PO2ART, HCO3, O2SAT in the last 168 hours.  Liver Function Tests: No results for input(s): AST, ALT, ALKPHOS, BILITOT, PROT, ALBUMIN in the last 168 hours. No results for input(s): LIPASE, AMYLASE in the last 168 hours. No results for input(s): AMMONIA in the last 168 hours.  CBC: Recent Labs  Lab 01/13/21 0616 01/16/21 0859  WBC 12.4* 11.8*  HGB 9.6* 10.7*  HCT 30.0* 33.4*  MCV 99.3 97.4  PLT 250 268    Cardiac Enzymes: No results for input(s): CKTOTAL, CKMB, CKMBINDEX, TROPONINI in the last 168 hours.  BNP (last 3 results) No results for  input(s): BNP in the last 8760 hours.  ProBNP (last 3 results) No results for input(s): PROBNP in the last 8760 hours.  Radiological Exams: No results found.  Assessment/Plan Active Problems:   Acute on chronic respiratory failure with hypoxia (HCC)   COVID-19 virus infection   Tracheostomy status (HCC)   Severe sepsis (HCC)   1. Acute on chronic respiratory failure hypoxia we will continue with pressure control mode patient has not been tolerating weaning.  Patient was seen by ENT apparently a tracheostomy was adjusted leak is improved 2. COVID-19 virus infection recovery 3. Tracheostomy remains in place 4. Severe sepsis treated resolved   I have personally seen and evaluated the patient, evaluated laboratory and imaging results, formulated the assessment and plan and placed orders. The Patient requires high complexity decision making with multiple systems involvement.  Rounds were done with the Respiratory Therapy Director and Staff therapists and discussed with nursing staff also.  Yevonne Pax, MD [facility] Pulmonary Critical Care Medicine Sleep Medicine

## 2021-01-20 DIAGNOSIS — J9621 Acute and chronic respiratory failure with hypoxia: Secondary | ICD-10-CM | POA: Diagnosis not present

## 2021-01-20 DIAGNOSIS — Z930 Tracheostomy status: Secondary | ICD-10-CM | POA: Diagnosis not present

## 2021-01-20 DIAGNOSIS — A419 Sepsis, unspecified organism: Secondary | ICD-10-CM | POA: Diagnosis not present

## 2021-01-20 DIAGNOSIS — U071 COVID-19: Secondary | ICD-10-CM | POA: Diagnosis not present

## 2021-01-20 NOTE — Progress Notes (Signed)
Pulmonary Critical Care Medicine [facility] [place]   PULMONARY CRITICAL CARE SERVICE  PROGRESS NOTE  Date of Service: 01/20/2021  Lawrence Freeman  [id number]:[id number]  DOB: 14-Oct-1971   DOA: 11/23/2020  Referring Physician: Carron Curie, MD  HPI: Lawrence Freeman is a 50 y.o. male seen for follow up of Acute on Chronic Respiratory Failure.  Patient is comfortable right now without distress has been on pressure support on 30% FiO2 with a pressure of 12/5  Medications: Reviewed on Rounds  Physical Exam:  Vitals: Temperature is 98.1 pulse 65 respiratory 23 blood pressure is 124/81 saturations 100%  Ventilator Settings on pressure support FiO2 30% pressure 12/5  . General: Comfortable at this time . Eyes: Grossly normal lids, irises & conjunctiva . ENT: grossly tongue is normal . Neck: no obvious mass . Cardiovascular: S1 S2 normal no gallop . Respiratory: No rhonchi no rales noted at this time . Abdomen: soft . Skin: no rash seen on limited exam . Musculoskeletal: not rigid . Psychiatric:unable to assess . Neurologic: no seizure no involuntary movements         Lab Data:   Basic Metabolic Panel: Recent Labs  Lab 01/16/21 0859  NA 137  K 4.4  CL 95*  CO2 30  GLUCOSE 128*  BUN 47*  CREATININE 0.64  CALCIUM 9.6  MG 1.8    ABG: No results for input(s): PHART, PCO2ART, PO2ART, HCO3, O2SAT in the last 168 hours.  Liver Function Tests: No results for input(s): AST, ALT, ALKPHOS, BILITOT, PROT, ALBUMIN in the last 168 hours. No results for input(s): LIPASE, AMYLASE in the last 168 hours. No results for input(s): AMMONIA in the last 168 hours.  CBC: Recent Labs  Lab 01/16/21 0859  WBC 11.8*  HGB 10.7*  HCT 33.4*  MCV 97.4  PLT 268    Cardiac Enzymes: No results for input(s): CKTOTAL, CKMB, CKMBINDEX, TROPONINI in the last 168 hours.  BNP (last 3 results) No results for input(s): BNP in the last 8760 hours.  ProBNP (last 3  results) No results for input(s): PROBNP in the last 8760 hours.  Radiological Exams: No results found.  Assessment/Plan Active Problems:   Acute on chronic respiratory failure with hypoxia (HCC)   COVID-19 virus infection   Tracheostomy status (HCC)   Severe sepsis (HCC)   1. Acute on chronic respiratory failure hypoxia we will continue with pressure support titrate oxygen continue secretion management. 2. COVID-19 virus infection no change we will continue supportive care 3. Severe sepsis resolved 4. Tracheostomy remains in place at this time we will continue to monitor   I have personally seen and evaluated the patient, evaluated laboratory and imaging results, formulated the assessment and plan and placed orders. The Patient requires high complexity decision making with multiple systems involvement.  Rounds were done with the Respiratory Therapy Director and Staff therapists and discussed with nursing staff also.  Yevonne Pax, MD [facility] Pulmonary Critical Care Medicine Sleep Medicine

## 2021-01-21 DIAGNOSIS — J9621 Acute and chronic respiratory failure with hypoxia: Secondary | ICD-10-CM | POA: Diagnosis not present

## 2021-01-21 DIAGNOSIS — Z930 Tracheostomy status: Secondary | ICD-10-CM | POA: Diagnosis not present

## 2021-01-21 DIAGNOSIS — A419 Sepsis, unspecified organism: Secondary | ICD-10-CM | POA: Diagnosis not present

## 2021-01-21 DIAGNOSIS — U071 COVID-19: Secondary | ICD-10-CM | POA: Diagnosis not present

## 2021-01-21 NOTE — Progress Notes (Signed)
Pulmonary Critical Care Medicine [facility] [place]   PULMONARY CRITICAL CARE SERVICE  PROGRESS NOTE  Date of Service: 01/21/2021  Lawrence Freeman  [id number]:[id number]  DOB: [birth year]   DOA: 11/23/2020  Referring Physician: Carron Curie, MD  HPI: Lawrence Freeman is a 50 y.o. male seen for follow up of Acute on Chronic Respiratory Failure.  Patient currently is on pressure control mode has been failing weaning attempts.  Spoke with respiratory therapy about the possibility of doing T collar trials.  This may actually be more comfortable for him rather than being on the ventilator.  Medications: Reviewed on Rounds  Physical Exam:  Vitals: Temperature 97.4 pulse 79 respiratory rate is 27 blood pressure is 117/74 saturations are 100%  Ventilator Settings patient is on pressure control FiO2 35% IP is 22  . General: Comfortable at this time . Eyes: Grossly normal lids, irises & conjunctiva . ENT: grossly tongue is normal . Neck: no obvious mass . Cardiovascular: S1 S2 normal no gallop . Respiratory: Scattered rhonchi expansion is equal . Abdomen: soft . Skin: no rash seen on limited exam . Musculoskeletal: not rigid . Psychiatric:unable to assess . Neurologic: no seizure no involuntary movements         Lab Data:   Basic Metabolic Panel: Recent Labs  Lab 01/16/21 0859  NA 137  K 4.4  CL 95*  CO2 30  GLUCOSE 128*  BUN 47*  CREATININE 0.64  CALCIUM 9.6  MG 1.8    ABG: No results for input(s): PHART, PCO2ART, PO2ART, HCO3, O2SAT in the last 168 hours.  Liver Function Tests: No results for input(s): AST, ALT, ALKPHOS, BILITOT, PROT, ALBUMIN in the last 168 hours. No results for input(s): LIPASE, AMYLASE in the last 168 hours. No results for input(s): AMMONIA in the last 168 hours.  CBC: Recent Labs  Lab 01/16/21 0859  WBC 11.8*  HGB 10.7*  HCT 33.4*  MCV 97.4  PLT 268    Cardiac Enzymes: No results for input(s): CKTOTAL, CKMB,  CKMBINDEX, TROPONINI in the last 168 hours.  BNP (last 3 results) No results for input(s): BNP in the last 8760 hours.  ProBNP (last 3 results) No results for input(s): PROBNP in the last 8760 hours.  Radiological Exams: No results found.  Assessment/Plan Active Problems:   Acute on chronic respiratory failure with hypoxia (HCC)   COVID-19 virus infection   Tracheostomy status (HCC)   Severe sepsis (HCC)   1. Acute on chronic respiratory failure hypoxia plan is to continue with the goal of trying to get onto T collar today.  Patient seems to be resting right now so hopefully will be able to do it 2. COVID-19 virus infection recovery we will continue to follow 3. Tracheostomy remains in place 4. Severe sepsis treated resolved   I have personally seen and evaluated the patient, evaluated laboratory and imaging results, formulated the assessment and plan and placed orders. The Patient requires high complexity decision making with multiple systems involvement.  Rounds were done with the Respiratory Therapy Director and Staff therapists and discussed with nursing staff also.  Lawrence Pax, MD [facility] Pulmonary Critical Care Medicine Sleep Medicine

## 2021-01-22 DIAGNOSIS — U071 COVID-19: Secondary | ICD-10-CM | POA: Diagnosis not present

## 2021-01-22 DIAGNOSIS — A419 Sepsis, unspecified organism: Secondary | ICD-10-CM | POA: Diagnosis not present

## 2021-01-22 DIAGNOSIS — J9621 Acute and chronic respiratory failure with hypoxia: Secondary | ICD-10-CM | POA: Diagnosis not present

## 2021-01-22 DIAGNOSIS — Z930 Tracheostomy status: Secondary | ICD-10-CM | POA: Diagnosis not present

## 2021-01-22 NOTE — Progress Notes (Signed)
Pulmonary Critical Care Medicine [facility] [place]   PULMONARY CRITICAL CARE SERVICE  PROGRESS NOTE  Date of Service: 01/22/2021  Lawrence Freeman  NGE:[id number]  DOB: 12-05-1971   DOA: 11/23/2020  Referring Physician: Carron Curie, MD  HPI: Lawrence Freeman is a 50 y.o. male seen for follow up of Acute on Chronic Respiratory Failure.  Patient at this time is on pressure control mode remains at baseline yesterday did 2 sessions of 30 minutes of T-bar with difficulty  Medications: Reviewed on Rounds  Physical Exam:  Vitals: Temperature 96.8 pulse 70 respiratory rate is 30 blood pressure is 110/67 saturations 98%  Ventilator Settings on pressure control FiO2 is 30% IP 22 PEEP 5  . General: Comfortable at this time . Eyes: Grossly normal lids, irises & conjunctiva . ENT: grossly tongue is normal . Neck: no obvious mass . Cardiovascular: S1 S2 normal no gallop . Respiratory: Coarse breath sounds with few scattered rhonchi . Abdomen: soft . Skin: no rash seen on limited exam . Musculoskeletal: not rigid . Psychiatric:unable to assess . Neurologic: no seizure no involuntary movements         Lab Data:   Basic Metabolic Panel: Recent Labs  Lab 01/16/21 0859  NA 137  K 4.4  CL 95*  CO2 30  GLUCOSE 128*  BUN 47*  CREATININE 0.64  CALCIUM 9.6  MG 1.8    ABG: No results for input(s): PHART, PCO2ART, PO2ART, HCO3, O2SAT in the last 168 hours.  Liver Function Tests: No results for input(s): AST, ALT, ALKPHOS, BILITOT, PROT, ALBUMIN in the last 168 hours. No results for input(s): LIPASE, AMYLASE in the last 168 hours. No results for input(s): AMMONIA in the last 168 hours.  CBC: Recent Labs  Lab 01/16/21 0859  WBC 11.8*  HGB 10.7*  HCT 33.4*  MCV 97.4  PLT 268    Cardiac Enzymes: No results for input(s): CKTOTAL, CKMB, CKMBINDEX, TROPONINI in the last 168 hours.  BNP (last 3 results) No results for input(s): BNP in the last 8760  hours.  ProBNP (last 3 results) No results for input(s): PROBNP in the last 8760 hours.  Radiological Exams: No results found.  Assessment/Plan Active Problems:   Acute on chronic respiratory failure with hypoxia (HCC)   COVID-19 virus infection   Tracheostomy status (HCC)   Severe sepsis (HCC)   1. Acute on chronic respiratory failure with hypoxia plan is to continue with the pressure control full support we will try once again to do T collar 2. COVID-19 virus infection in recovery we will continue with supportive care 3. Tracheostomy will remain in place 4. Severe sepsis treated resolved   I have personally seen and evaluated the patient, evaluated laboratory and imaging results, formulated the assessment and plan and placed orders. The Patient requires high complexity decision making with multiple systems involvement.  Rounds were done with the Respiratory Therapy Director and Staff therapists and discussed with nursing staff also.  Yevonne Pax, MD [facility] Pulmonary Critical Care Medicine Sleep Medicine

## 2021-01-23 DIAGNOSIS — J9621 Acute and chronic respiratory failure with hypoxia: Secondary | ICD-10-CM | POA: Diagnosis not present

## 2021-01-23 DIAGNOSIS — Z930 Tracheostomy status: Secondary | ICD-10-CM | POA: Diagnosis not present

## 2021-01-23 DIAGNOSIS — U071 COVID-19: Secondary | ICD-10-CM | POA: Diagnosis not present

## 2021-01-23 DIAGNOSIS — A419 Sepsis, unspecified organism: Secondary | ICD-10-CM | POA: Diagnosis not present

## 2021-01-23 LAB — BASIC METABOLIC PANEL
Anion gap: 14 (ref 5–15)
BUN: 43 mg/dL — ABNORMAL HIGH (ref 6–20)
CO2: 29 mmol/L (ref 22–32)
Calcium: 9.9 mg/dL (ref 8.9–10.3)
Chloride: 93 mmol/L — ABNORMAL LOW (ref 98–111)
Creatinine, Ser: 0.71 mg/dL (ref 0.61–1.24)
GFR, Estimated: >60 mL/min (ref >60)
Glucose, Bld: 136 mg/dL — ABNORMAL HIGH (ref 70–99)
Potassium: 5.2 mmol/L — ABNORMAL HIGH (ref 3.5–5.1)
Sodium: 136 mmol/L (ref 135–145)

## 2021-01-23 LAB — CBC
HCT: 40.1 % (ref 39.0–52.0)
Hemoglobin: 13.6 g/dL (ref 13.0–17.0)
MCH: 32.5 pg (ref 26.0–34.0)
MCHC: 33.9 g/dL (ref 30.0–36.0)
MCV: 95.9 fL (ref 80.0–100.0)
Platelets: 323 10*3/uL (ref 150–400)
RBC: 4.18 MIL/uL — ABNORMAL LOW (ref 4.22–5.81)
RDW: 15.5 % (ref 11.5–15.5)
WBC: 17.3 10*3/uL — ABNORMAL HIGH (ref 4.0–10.5)
nRBC: 0 % (ref 0.0–0.2)

## 2021-01-23 NOTE — Progress Notes (Signed)
Pulmonary Critical Care Medicine [facility] [place]   PULMONARY CRITICAL CARE SERVICE  PROGRESS NOTE  Date of Service: 01/23/2021  Lawrence Freeman  [id number]:[id number]  DOB: 07/09/1971   DOA: 11/23/2020  Referring Physician: Carron Curie, MD  HPI: Lawrence Freeman is a 50 y.o. male seen for follow up of Acute on Chronic Respiratory Failure.  Patient currently is on pressure control mode on 30% FiO2 good saturations are noted.  Medications: Reviewed on Rounds  Physical Exam:  Vitals: Temperature 96.9 pulse 83 respiratory 22 blood pressure is 107/68 saturations 98%  Ventilator Settings on pressure assist control FiO2 is 30% IP 22 PEEP 5  . General: Comfortable at this time . Eyes: Grossly normal lids, irises & conjunctiva . ENT: grossly tongue is normal . Neck: no obvious mass . Cardiovascular: S1 S2 normal no gallop . Respiratory: Scattered rhonchi expansion is equal . Abdomen: soft . Skin: no rash seen on limited exam . Musculoskeletal: not rigid . Psychiatric:unable to assess . Neurologic: no seizure no involuntary movements         Lab Data:   Basic Metabolic Panel: No results for input(s): NA, K, CL, CO2, GLUCOSE, BUN, CREATININE, CALCIUM, MG, PHOS in the last 168 hours.  ABG: No results for input(s): PHART, PCO2ART, PO2ART, HCO3, O2SAT in the last 168 hours.  Liver Function Tests: No results for input(s): AST, ALT, ALKPHOS, BILITOT, PROT, ALBUMIN in the last 168 hours. No results for input(s): LIPASE, AMYLASE in the last 168 hours. No results for input(s): AMMONIA in the last 168 hours.  CBC: No results for input(s): WBC, NEUTROABS, HGB, HCT, MCV, PLT in the last 168 hours.  Cardiac Enzymes: No results for input(s): CKTOTAL, CKMB, CKMBINDEX, TROPONINI in the last 168 hours.  BNP (last 3 results) No results for input(s): BNP in the last 8760 hours.  ProBNP (last 3 results) No results for input(s): PROBNP in the last 8760  hours.  Radiological Exams: No results found.  Assessment/Plan Active Problems:   Acute on chronic respiratory failure with hypoxia (HCC)   COVID-19 virus infection   Tracheostomy status (HCC)   Severe sepsis (HCC)   1. Acute on chronic respiratory failure hypoxia we will continue with pressure control titrate as tolerated. 2. COVID-19 virus infection in recovery 3. Tracheostomy remains in place 4. Severe sepsis treated   I have personally seen and evaluated the patient, evaluated laboratory and imaging results, formulated the assessment and plan and placed orders. The Patient requires high complexity decision making with multiple systems involvement.  Rounds were done with the Respiratory Therapy Director and Staff therapists and discussed with nursing staff also.  Yevonne Pax, MD [facility] Pulmonary Critical Care Medicine Sleep Medicine

## 2021-01-24 DIAGNOSIS — J9621 Acute and chronic respiratory failure with hypoxia: Secondary | ICD-10-CM | POA: Diagnosis not present

## 2021-01-24 DIAGNOSIS — A419 Sepsis, unspecified organism: Secondary | ICD-10-CM | POA: Diagnosis not present

## 2021-01-24 DIAGNOSIS — Z930 Tracheostomy status: Secondary | ICD-10-CM | POA: Diagnosis not present

## 2021-01-24 DIAGNOSIS — U071 COVID-19: Secondary | ICD-10-CM | POA: Diagnosis not present

## 2021-01-24 LAB — URINALYSIS, ROUTINE W REFLEX MICROSCOPIC
Bilirubin Urine: NEGATIVE
Glucose, UA: NEGATIVE mg/dL
Hgb urine dipstick: NEGATIVE
Ketones, ur: NEGATIVE mg/dL
Leukocytes,Ua: NEGATIVE
Nitrite: NEGATIVE
Protein, ur: NEGATIVE mg/dL
Specific Gravity, Urine: 1.01 (ref 1.005–1.030)
pH: 8 (ref 5.0–8.0)

## 2021-01-24 NOTE — Progress Notes (Signed)
Pulmonary Critical Care Medicine [facility] [place]   PULMONARY CRITICAL CARE SERVICE  PROGRESS NOTE  Date of Service: 01/24/2021  Lawrence Freeman  KGM:[id number]  DOB: 1971/02/15   DOA: 11/23/2020  Referring Physician: Carron Curie, MD  HPI: Lawrence Freeman is a 50 y.o. male seen for follow up of Acute on Chronic Respiratory Failure.  Yesterday patient had T collar for about 4 hours today looks calm ready for weaning once again will push further today  Medications: Reviewed on Rounds  Physical Exam:  Vitals: Temperature is 96.5 pulse 89 respiratory rate is 21 blood pressure is 104/68 saturations 99%  Ventilator Settings on T collar wean for 4 hours  . General: Comfortable at this time . Eyes: Grossly normal lids, irises & conjunctiva . ENT: grossly tongue is normal . Neck: no obvious mass . Cardiovascular: S1 S2 normal no gallop . Respiratory: Scattered rhonchi coarse breath sound . Abdomen: soft . Skin: no rash seen on limited exam . Musculoskeletal: not rigid . Psychiatric:unable to assess . Neurologic: no seizure no involuntary movements         Lab Data:   Basic Metabolic Panel: Recent Labs  Lab 01/23/21 1045  NA 136  K 5.2*  CL 93*  CO2 29  GLUCOSE 136*  BUN 43*  CREATININE 0.71  CALCIUM 9.9    ABG: No results for input(s): PHART, PCO2ART, PO2ART, HCO3, O2SAT in the last 168 hours.  Liver Function Tests: No results for input(s): AST, ALT, ALKPHOS, BILITOT, PROT, ALBUMIN in the last 168 hours. No results for input(s): LIPASE, AMYLASE in the last 168 hours. No results for input(s): AMMONIA in the last 168 hours.  CBC: Recent Labs  Lab 01/23/21 1045  WBC 17.3*  HGB 13.6  HCT 40.1  MCV 95.9  PLT 323    Cardiac Enzymes: No results for input(s): CKTOTAL, CKMB, CKMBINDEX, TROPONINI in the last 168 hours.  BNP (last 3 results) No results for input(s): BNP in the last 8760 hours.  ProBNP (last 3 results) No results for  input(s): PROBNP in the last 8760 hours.  Radiological Exams: No results found.  Assessment/Plan Active Problems:   Acute on chronic respiratory failure with hypoxia (HCC)   COVID-19 virus infection   Tracheostomy status (HCC)   Severe sepsis (HCC)   1. Acute on chronic respiratory failure hypoxia we will continue with trial advance weaning as tolerated. 2. COVID-19 virus infection in recovery we will continue to follow 3. Tracheostomy right now remains in place 4. Sepsis resolved 5. Severe anxiety medications being adjusted   I have personally seen and evaluated the patient, evaluated laboratory and imaging results, formulated the assessment and plan and placed orders. The Patient requires high complexity decision making with multiple systems involvement.  Rounds were done with the Respiratory Therapy Director and Staff therapists and discussed with nursing staff also.  Yevonne Pax, MD [facility] Pulmonary Critical Care Medicine Sleep Medicine

## 2021-01-25 ENCOUNTER — Other Ambulatory Visit (HOSPITAL_COMMUNITY): Payer: Self-pay

## 2021-01-25 DIAGNOSIS — Z930 Tracheostomy status: Secondary | ICD-10-CM | POA: Diagnosis not present

## 2021-01-25 DIAGNOSIS — U071 COVID-19: Secondary | ICD-10-CM | POA: Diagnosis not present

## 2021-01-25 DIAGNOSIS — J9621 Acute and chronic respiratory failure with hypoxia: Secondary | ICD-10-CM | POA: Diagnosis not present

## 2021-01-25 DIAGNOSIS — A419 Sepsis, unspecified organism: Secondary | ICD-10-CM | POA: Diagnosis not present

## 2021-01-25 NOTE — Progress Notes (Signed)
Pulmonary Critical Care Medicine [facility] [place]   PULMONARY CRITICAL CARE SERVICE  PROGRESS NOTE  Date of Service: 01/25/2021  Finnian Husted  [id number]:[id number]  DOB: 1971-08-17   DOA: 11/23/2020  Referring Physician: Carron Curie, MD  HPI: Jaxxson Cavanah is a 50 y.o. male seen for follow up of Acute on Chronic Respiratory Failure.  Patient currently is on pressure control ventilation.  Did 4 1/2 hours yesterday of ATC  Medications: Reviewed on Rounds  Physical Exam:  Vitals: Temperature 97.0 pulse 87 respiratory rate is 29 blood pressure one 3/73 saturations 97%  Ventilator Settings on pressure assist control FiO2 30% IP 22 PEEP of 5  . General: Comfortable at this time . Eyes: Grossly normal lids, irises & conjunctiva . ENT: grossly tongue is normal . Neck: no obvious mass . Cardiovascular: S1 S2 normal no gallop . Respiratory: Scattered rhonchi expansion is equal . Abdomen: soft . Skin: no rash seen on limited exam . Musculoskeletal: not rigid . Psychiatric:unable to assess . Neurologic: no seizure no involuntary movements         Lab Data:   Basic Metabolic Panel: Recent Labs  Lab 01/23/21 1045  NA 136  K 5.2*  CL 93*  CO2 29  GLUCOSE 136*  BUN 43*  CREATININE 0.71  CALCIUM 9.9    ABG: No results for input(s): PHART, PCO2ART, PO2ART, HCO3, O2SAT in the last 168 hours.  Liver Function Tests: No results for input(s): AST, ALT, ALKPHOS, BILITOT, PROT, ALBUMIN in the last 168 hours. No results for input(s): LIPASE, AMYLASE in the last 168 hours. No results for input(s): AMMONIA in the last 168 hours.  CBC: Recent Labs  Lab 01/23/21 1045  WBC 17.3*  HGB 13.6  HCT 40.1  MCV 95.9  PLT 323    Cardiac Enzymes: No results for input(s): CKTOTAL, CKMB, CKMBINDEX, TROPONINI in the last 168 hours.  BNP (last 3 results) No results for input(s): BNP in the last 8760 hours.  ProBNP (last 3 results) No results for input(s):  PROBNP in the last 8760 hours.  Radiological Exams: DG CHEST PORT 1 VIEW  Result Date: 01/25/2021 CLINICAL DATA:  Respiratory failure.  History of COVID. EXAM: PORTABLE CHEST 1 VIEW COMPARISON:  01/16/2021.  12/29/2020. FINDINGS: Tracheostomy tube in stable position. Heart size stable. Times. Bilateral interstitial infiltrates again noted with slight improvement from prior exams. Pneumothorax. IMPRESSION: Tracheostomy tube in stable position. Low lung volumes. Slight improvement of bilateral interstitial infiltrates. Electronically Signed   By: Maisie Fus  Register   On: 01/25/2021 06:15    Assessment/Plan Active Problems:   Acute on chronic respiratory failure with hypoxia (HCC)   COVID-19 virus infection   Tracheostomy status (HCC)   Severe sepsis (HCC)   1. Acute on chronic respiratory failure hypoxia we will continue with full support on pressure control mode titrate oxygen continue pulmonary toilet. 2. COVID-19 virus infection recovery we will continue to follow 3. Severe sepsis treated resolved 4. Tracheostomy remains in place   I have personally seen and evaluated the patient, evaluated laboratory and imaging results, formulated the assessment and plan and placed orders. The Patient requires high complexity decision making with multiple systems involvement.  Rounds were done with the Respiratory Therapy Director and Staff therapists and discussed with nursing staff also.  Yevonne Pax, MD [facility] Pulmonary Critical Care Medicine Sleep Medicine

## 2021-01-26 DIAGNOSIS — A419 Sepsis, unspecified organism: Secondary | ICD-10-CM | POA: Diagnosis not present

## 2021-01-26 DIAGNOSIS — U071 COVID-19: Secondary | ICD-10-CM | POA: Diagnosis not present

## 2021-01-26 DIAGNOSIS — Z930 Tracheostomy status: Secondary | ICD-10-CM | POA: Diagnosis not present

## 2021-01-26 DIAGNOSIS — J9621 Acute and chronic respiratory failure with hypoxia: Secondary | ICD-10-CM | POA: Diagnosis not present

## 2021-01-26 LAB — BASIC METABOLIC PANEL
Anion gap: 13 (ref 5–15)
BUN: 54 mg/dL — ABNORMAL HIGH (ref 6–20)
CO2: 30 mmol/L (ref 22–32)
Calcium: 10.1 mg/dL (ref 8.9–10.3)
Chloride: 95 mmol/L — ABNORMAL LOW (ref 98–111)
Creatinine, Ser: 0.93 mg/dL (ref 0.61–1.24)
GFR, Estimated: >60 mL/min (ref >60)
Glucose, Bld: 128 mg/dL — ABNORMAL HIGH (ref 70–99)
Potassium: 4.5 mmol/L (ref 3.5–5.1)
Sodium: 138 mmol/L (ref 135–145)

## 2021-01-26 LAB — CBC
HCT: 38.1 % — ABNORMAL LOW (ref 39.0–52.0)
Hemoglobin: 12.5 g/dL — ABNORMAL LOW (ref 13.0–17.0)
MCH: 31.6 pg (ref 26.0–34.0)
MCHC: 32.8 g/dL (ref 30.0–36.0)
MCV: 96.5 fL (ref 80.0–100.0)
Platelets: 254 10*3/uL (ref 150–400)
RBC: 3.95 MIL/uL — ABNORMAL LOW (ref 4.22–5.81)
RDW: 15.2 % (ref 11.5–15.5)
WBC: 15.9 10*3/uL — ABNORMAL HIGH (ref 4.0–10.5)
nRBC: 0 % (ref 0.0–0.2)

## 2021-01-26 LAB — URINE CULTURE: Culture: NO GROWTH

## 2021-01-26 NOTE — Progress Notes (Signed)
Pulmonary Critical Care Medicine [facility] [place]   PULMONARY CRITICAL CARE SERVICE  PROGRESS NOTE  Date of Service: 01/26/2021  Lawrence Freeman  [id number]:[id number]  DOB: 1971-11-05   DOA: 11/23/2020  Referring Physician: Carron Curie, MD  HPI: Lawrence Freeman is a 50 y.o. male seen for follow up of Acute on Chronic Respiratory Failure.  Patient currently is on T collar was able to do 6 hours yesterday today's goal is 8-hour  Medications: Reviewed on Rounds  Physical Exam:  Vitals: Temperature is 96.5 pulse 67 respiratory 25 blood pressure is 109/68 saturations 100%  Ventilator Settings on T collar  . General: Comfortable at this time . Eyes: Grossly normal lids, irises & conjunctiva . ENT: grossly tongue is normal . Neck: no obvious mass . Cardiovascular: S1 S2 normal no gallop . Respiratory: Scattered rhonchi . Abdomen: soft . Skin: no rash seen on limited exam . Musculoskeletal: not rigid . Psychiatric:unable to assess . Neurologic: no seizure no involuntary movements         Lab Data:   Basic Metabolic Panel: Recent Labs  Lab 01/23/21 1045 01/26/21 0343  NA 136 138  K 5.2* 4.5  CL 93* 95*  CO2 29 30  GLUCOSE 136* 128*  BUN 43* 54*  CREATININE 0.71 0.93  CALCIUM 9.9 10.1    ABG: No results for input(s): PHART, PCO2ART, PO2ART, HCO3, O2SAT in the last 168 hours.  Liver Function Tests: No results for input(s): AST, ALT, ALKPHOS, BILITOT, PROT, ALBUMIN in the last 168 hours. No results for input(s): LIPASE, AMYLASE in the last 168 hours. No results for input(s): AMMONIA in the last 168 hours.  CBC: Recent Labs  Lab 01/23/21 1045 01/26/21 0343  WBC 17.3* 15.9*  HGB 13.6 12.5*  HCT 40.1 38.1*  MCV 95.9 96.5  PLT 323 254    Cardiac Enzymes: No results for input(s): CKTOTAL, CKMB, CKMBINDEX, TROPONINI in the last 168 hours.  BNP (last 3 results) No results for input(s): BNP in the last 8760 hours.  ProBNP (last 3  results) No results for input(s): PROBNP in the last 8760 hours.  Radiological Exams: DG CHEST PORT 1 VIEW  Result Date: 01/25/2021 CLINICAL DATA:  Respiratory failure.  History of COVID. EXAM: PORTABLE CHEST 1 VIEW COMPARISON:  01/16/2021.  12/29/2020. FINDINGS: Tracheostomy tube in stable position. Heart size stable. Times. Bilateral interstitial infiltrates again noted with slight improvement from prior exams. Pneumothorax. IMPRESSION: Tracheostomy tube in stable position. Low lung volumes. Slight improvement of bilateral interstitial infiltrates. Electronically Signed   By: Maisie Fus  Register   On: 01/25/2021 06:15    Assessment/Plan Active Problems:   Acute on chronic respiratory failure with hypoxia (HCC)   COVID-19 virus infection   Tracheostomy status (HCC)   Severe sepsis (HCC)   1. Acute on chronic respiratory failure hypoxia we will continue to wean on the T-piece titrate oxygen as tolerated. 2. COVID-19 virus infection recovery 3. Tracheostomy remains in place 4. Severe sepsis resolved   I have personally seen and evaluated the patient, evaluated laboratory and imaging results, formulated the assessment and plan and placed orders. The Patient requires high complexity decision making with multiple systems involvement.  Rounds were done with the Respiratory Therapy Director and Staff therapists and discussed with nursing staff also.  Yevonne Pax, MD [facility] Pulmonary Critical Care Medicine Sleep Medicine

## 2021-01-27 DIAGNOSIS — J9621 Acute and chronic respiratory failure with hypoxia: Secondary | ICD-10-CM | POA: Diagnosis not present

## 2021-01-27 DIAGNOSIS — U071 COVID-19: Secondary | ICD-10-CM | POA: Diagnosis not present

## 2021-01-27 DIAGNOSIS — A419 Sepsis, unspecified organism: Secondary | ICD-10-CM | POA: Diagnosis not present

## 2021-01-27 DIAGNOSIS — Z930 Tracheostomy status: Secondary | ICD-10-CM | POA: Diagnosis not present

## 2021-01-27 NOTE — Progress Notes (Signed)
Pulmonary Critical Care Medicine [facility] [place]   PULMONARY CRITICAL CARE SERVICE  PROGRESS NOTE  Date of Service: 01/27/2021  Lawrence Freeman  DPO:[id number]  DOB: Apr 20, 1971   DOA: 11/23/2020  Referring Physician: Carron Curie, MD  HPI: Lawrence Freeman is a 50 y.o. male seen for follow up of Acute on Chronic Respiratory Failure.  Patient currently is on T collar has been on 35% FiO2 the goal is for 12 hours today  Medications: Reviewed on Rounds  Physical Exam:  Vitals: Temperature is 96.4 pulse 76 respiratory 23 blood pressure is 107/70 saturations 100%  Ventilator Settings on T collar with an FiO2 of 35%  . General: Comfortable at this time . Eyes: Grossly normal lids, irises & conjunctiva . ENT: grossly tongue is normal . Neck: no obvious mass . Cardiovascular: S1 S2 normal no gallop . Respiratory: Scattered rhonchi expansion is equal . Abdomen: soft . Skin: no rash seen on limited exam . Musculoskeletal: not rigid . Psychiatric:unable to assess . Neurologic: no seizure no involuntary movements         Lab Data:   Basic Metabolic Panel: Recent Labs  Lab 01/23/21 1045 01/26/21 0343  NA 136 138  K 5.2* 4.5  CL 93* 95*  CO2 29 30  GLUCOSE 136* 128*  BUN 43* 54*  CREATININE 0.71 0.93  CALCIUM 9.9 10.1    ABG: No results for input(s): PHART, PCO2ART, PO2ART, HCO3, O2SAT in the last 168 hours.  Liver Function Tests: No results for input(s): AST, ALT, ALKPHOS, BILITOT, PROT, ALBUMIN in the last 168 hours. No results for input(s): LIPASE, AMYLASE in the last 168 hours. No results for input(s): AMMONIA in the last 168 hours.  CBC: Recent Labs  Lab 01/23/21 1045 01/26/21 0343  WBC 17.3* 15.9*  HGB 13.6 12.5*  HCT 40.1 38.1*  MCV 95.9 96.5  PLT 323 254    Cardiac Enzymes: No results for input(s): CKTOTAL, CKMB, CKMBINDEX, TROPONINI in the last 168 hours.  BNP (last 3 results) No results for input(s): BNP in the last 8760  hours.  ProBNP (last 3 results) No results for input(s): PROBNP in the last 8760 hours.  Radiological Exams: No results found.  Assessment/Plan Active Problems:   Acute on chronic respiratory failure with hypoxia (HCC)   COVID-19 virus infection   Tracheostomy status (HCC)   Severe sepsis (HCC)   1. Acute on chronic respiratory failure hypoxia we will continue with T-piece titrate oxygen continue pulmonary toilet.  Goal today is 12 hours 2. COVID-19 virus infection in recovery 3. Tracheostomy remains in place 4. Severe sepsis treated and resolved   I have personally seen and evaluated the patient, evaluated laboratory and imaging results, formulated the assessment and plan and placed orders. The Patient requires high complexity decision making with multiple systems involvement.  Rounds were done with the Respiratory Therapy Director and Staff therapists and discussed with nursing staff also.  Yevonne Pax, MD [facility] Pulmonary Critical Care Medicine Sleep Medicine

## 2021-01-28 DIAGNOSIS — J9621 Acute and chronic respiratory failure with hypoxia: Secondary | ICD-10-CM | POA: Diagnosis not present

## 2021-01-28 DIAGNOSIS — Z930 Tracheostomy status: Secondary | ICD-10-CM | POA: Diagnosis not present

## 2021-01-28 DIAGNOSIS — A419 Sepsis, unspecified organism: Secondary | ICD-10-CM | POA: Diagnosis not present

## 2021-01-28 DIAGNOSIS — U071 COVID-19: Secondary | ICD-10-CM | POA: Diagnosis not present

## 2021-01-28 NOTE — Progress Notes (Signed)
Pulmonary Critical Care Medicine [facility] [place]   PULMONARY CRITICAL CARE SERVICE  PROGRESS NOTE  Date of Service: 01/28/2021  Lawrence Freeman  PFX:[id number]  DOB: 04-19-1971   DOA: 11/23/2020  Referring Physician: Carron Curie, MD  HPI: Lawrence Freeman is a 50 y.o. male seen for follow up of Acute on Chronic Respiratory Failure.  Patient is off the ventilator on T collar has been been doing fine did 8 hours yesterday  Medications: Reviewed on Rounds  Physical Exam:  Vitals: Temperature 96.8 pulse 84 respiratory 23 blood pressure is 106/63 saturations 97%  Ventilator Settings on T collar goal of 12 hours  . General: Comfortable at this time . Eyes: Grossly normal lids, irises & conjunctiva . ENT: grossly tongue is normal . Neck: no obvious mass . Cardiovascular: S1 S2 normal no gallop . Respiratory: Scattered rhonchi expansion is equal . Abdomen: soft . Skin: no rash seen on limited exam . Musculoskeletal: not rigid . Psychiatric:unable to assess . Neurologic: no seizure no involuntary movements         Lab Data:   Basic Metabolic Panel: Recent Labs  Lab 01/23/21 1045 01/26/21 0343  NA 136 138  K 5.2* 4.5  CL 93* 95*  CO2 29 30  GLUCOSE 136* 128*  BUN 43* 54*  CREATININE 0.71 0.93  CALCIUM 9.9 10.1    ABG: No results for input(s): PHART, PCO2ART, PO2ART, HCO3, O2SAT in the last 168 hours.  Liver Function Tests: No results for input(s): AST, ALT, ALKPHOS, BILITOT, PROT, ALBUMIN in the last 168 hours. No results for input(s): LIPASE, AMYLASE in the last 168 hours. No results for input(s): AMMONIA in the last 168 hours.  CBC: Recent Labs  Lab 01/23/21 1045 01/26/21 0343  WBC 17.3* 15.9*  HGB 13.6 12.5*  HCT 40.1 38.1*  MCV 95.9 96.5  PLT 323 254    Cardiac Enzymes: No results for input(s): CKTOTAL, CKMB, CKMBINDEX, TROPONINI in the last 168 hours.  BNP (last 3 results) No results for input(s): BNP in the last 8760  hours.  ProBNP (last 3 results) No results for input(s): PROBNP in the last 8760 hours.  Radiological Exams: No results found.  Assessment/Plan Active Problems:   Acute on chronic respiratory failure with hypoxia (HCC)   COVID-19 virus infection   Tracheostomy status (HCC)   Severe sepsis (HCC)   1. Acute on chronic respiratory failure hypoxia we will continue with the T collar goal of 12 hours 2. COVID-19 virus infection recovery 3. Tracheostomy remains in place 4. Severe sepsis treated resolved   I have personally seen and evaluated the patient, evaluated laboratory and imaging results, formulated the assessment and plan and placed orders. The Patient requires high complexity decision making with multiple systems involvement.  Rounds were done with the Respiratory Therapy Director and Staff therapists and discussed with nursing staff also.  Yevonne Pax, MD [facility] Pulmonary Critical Care Medicine Sleep Medicine

## 2021-01-30 DIAGNOSIS — Z930 Tracheostomy status: Secondary | ICD-10-CM | POA: Diagnosis not present

## 2021-01-30 DIAGNOSIS — J9621 Acute and chronic respiratory failure with hypoxia: Secondary | ICD-10-CM | POA: Diagnosis not present

## 2021-01-30 DIAGNOSIS — A419 Sepsis, unspecified organism: Secondary | ICD-10-CM | POA: Diagnosis not present

## 2021-01-30 DIAGNOSIS — U071 COVID-19: Secondary | ICD-10-CM | POA: Diagnosis not present

## 2021-01-30 LAB — CBC
HCT: 35.3 % — ABNORMAL LOW (ref 39.0–52.0)
Hemoglobin: 11.9 g/dL — ABNORMAL LOW (ref 13.0–17.0)
MCH: 32.2 pg (ref 26.0–34.0)
MCHC: 33.7 g/dL (ref 30.0–36.0)
MCV: 95.7 fL (ref 80.0–100.0)
Platelets: 246 10*3/uL (ref 150–400)
RBC: 3.69 MIL/uL — ABNORMAL LOW (ref 4.22–5.81)
RDW: 14.6 % (ref 11.5–15.5)
WBC: 10.2 10*3/uL (ref 4.0–10.5)
nRBC: 0 % (ref 0.0–0.2)

## 2021-01-30 LAB — BASIC METABOLIC PANEL
Anion gap: 12 (ref 5–15)
BUN: 50 mg/dL — ABNORMAL HIGH (ref 6–20)
CO2: 28 mmol/L (ref 22–32)
Calcium: 9.9 mg/dL (ref 8.9–10.3)
Chloride: 95 mmol/L — ABNORMAL LOW (ref 98–111)
Creatinine, Ser: 0.85 mg/dL (ref 0.61–1.24)
GFR, Estimated: >60 mL/min (ref >60)
Glucose, Bld: 180 mg/dL — ABNORMAL HIGH (ref 70–99)
Potassium: 3.9 mmol/L (ref 3.5–5.1)
Sodium: 135 mmol/L (ref 135–145)

## 2021-01-30 NOTE — Progress Notes (Signed)
Pulmonary Critical Care Medicine [facility] [place]   PULMONARY CRITICAL CARE SERVICE  PROGRESS NOTE  Date of Service: 01/30/2021  Lawrence Freeman  [id number]  DOB: 07-04-1971   DOA: 11/23/2020  Referring Physician: Carron Curie, MD  HPI: Lawrence Freeman is a 50 y.o. male seen for follow up of Acute on Chronic Respiratory Failure.  Patient right now is on T collar has been on 35% FiO2 good saturations are noted  Medications: Reviewed on Rounds  Physical Exam:  Vitals: Temperature 97.4 pulse 107 respiratory rate 28 blood pressure is 100/59 saturations 98%  Ventilator Settings on T collar FiO2 35%  . General: Comfortable at this time . Eyes: Grossly normal lids, irises & conjunctiva . ENT: grossly tongue is normal . Neck: no obvious mass . Cardiovascular: S1 S2 normal no gallop . Respiratory: Scattered rhonchi noted bilaterally . Abdomen: soft . Skin: no rash seen on limited exam . Musculoskeletal: not rigid . Psychiatric:unable to assess . Neurologic: no seizure no involuntary movements         Lab Data:   Basic Metabolic Panel: Recent Labs  Lab 01/26/21 0343 01/30/21 0406  NA 138 135  K 4.5 3.9  CL 95* 95*  CO2 30 28  GLUCOSE 128* 180*  BUN 54* 50*  CREATININE 0.93 0.85  CALCIUM 10.1 9.9    ABG: No results for input(s): PHART, PCO2ART, PO2ART, HCO3, O2SAT in the last 168 hours.  Liver Function Tests: No results for input(s): AST, ALT, ALKPHOS, BILITOT, PROT, ALBUMIN in the last 168 hours. No results for input(s): LIPASE, AMYLASE in the last 168 hours. No results for input(s): AMMONIA in the last 168 hours.  CBC: Recent Labs  Lab 01/26/21 0343 01/30/21 0406  WBC 15.9* 10.2  HGB 12.5* 11.9*  HCT 38.1* 35.3*  MCV 96.5 95.7  PLT 254 246    Cardiac Enzymes: No results for input(s): CKTOTAL, CKMB, CKMBINDEX, TROPONINI in the last 168 hours.  BNP (last 3 results) No results for input(s): BNP in the last 8760  hours.  ProBNP (last 3 results) No results for input(s): PROBNP in the last 8760 hours.  Radiological Exams: No results found.  Assessment/Plan Active Problems:   Acute on chronic respiratory failure with hypoxia (HCC)   COVID-19 virus infection   Tracheostomy status (HCC)   Severe sepsis (HCC)   1. Acute on chronic respiratory failure hypoxia we will continue T collar trials titrate oxygen continue pulmonary toilet. 2. COVID-19 virus infection recovery phase we will continue to follow 3. Tracheostomy remains in place 4. Severe sepsis treated resolving   I have personally seen and evaluated the patient, evaluated laboratory and imaging results, formulated the assessment and plan and placed orders. The Patient requires high complexity decision making with multiple systems involvement.  Rounds were done with the Respiratory Therapy Director and Staff therapists and discussed with nursing staff also.  Yevonne Pax, MD [facility] Pulmonary Critical Care Medicine Sleep Medicine

## 2021-01-31 DIAGNOSIS — Z930 Tracheostomy status: Secondary | ICD-10-CM | POA: Diagnosis not present

## 2021-01-31 DIAGNOSIS — U071 COVID-19: Secondary | ICD-10-CM | POA: Diagnosis not present

## 2021-01-31 DIAGNOSIS — A419 Sepsis, unspecified organism: Secondary | ICD-10-CM | POA: Diagnosis not present

## 2021-01-31 DIAGNOSIS — J9621 Acute and chronic respiratory failure with hypoxia: Secondary | ICD-10-CM | POA: Diagnosis not present

## 2021-01-31 NOTE — Progress Notes (Signed)
Pulmonary Critical Care Medicine [facility] [place]   PULMONARY CRITICAL CARE SERVICE  PROGRESS NOTE  Date of Service: 01/31/2021  Lawrence Freeman  [id number]:[id number]  DOB: Mar 05, 1971   DOA: 11/23/2020  Referring Physician: Carron Curie, MD  HPI: Lawrence Freeman is a 50 y.o. male seen for follow up of Acute on Chronic Respiratory Failure.  Patient right now is on T collar has been on 35% FiO2 date 11 hours yesterday  Medications: Reviewed on Rounds  Physical Exam:  Vitals: Temperature is 96.8 pulse 78 respiratory rate is 23 blood pressure is 101/65 saturations 96%  Ventilator Settings off the ventilator on T collar  . General: Comfortable at this time . Eyes: Grossly normal lids, irises & conjunctiva . ENT: grossly tongue is normal . Neck: no obvious mass . Cardiovascular: S1 S2 normal no gallop . Respiratory: Scattered rhonchi expansion equal . Abdomen: soft . Skin: no rash seen on limited exam . Musculoskeletal: not rigid . Psychiatric:unable to assess . Neurologic: no seizure no involuntary movements         Lab Data:   Basic Metabolic Panel: Recent Labs  Lab 01/26/21 0343 01/30/21 0406  NA 138 135  K 4.5 3.9  CL 95* 95*  CO2 30 28  GLUCOSE 128* 180*  BUN 54* 50*  CREATININE 0.93 0.85  CALCIUM 10.1 9.9    ABG: No results for input(s): PHART, PCO2ART, PO2ART, HCO3, O2SAT in the last 168 hours.  Liver Function Tests: No results for input(s): AST, ALT, ALKPHOS, BILITOT, PROT, ALBUMIN in the last 168 hours. No results for input(s): LIPASE, AMYLASE in the last 168 hours. No results for input(s): AMMONIA in the last 168 hours.  CBC: Recent Labs  Lab 01/26/21 0343 01/30/21 0406  WBC 15.9* 10.2  HGB 12.5* 11.9*  HCT 38.1* 35.3*  MCV 96.5 95.7  PLT 254 246    Cardiac Enzymes: No results for input(s): CKTOTAL, CKMB, CKMBINDEX, TROPONINI in the last 168 hours.  BNP (last 3 results) No results for input(s): BNP in the last 8760  hours.  ProBNP (last 3 results) No results for input(s): PROBNP in the last 8760 hours.  Radiological Exams: No results found.  Assessment/Plan Active Problems:   Acute on chronic respiratory failure with hypoxia (HCC)   COVID-19 virus infection   Tracheostomy status (HCC)   Severe sepsis (HCC)   1. Acute on chronic respiratory failure hypoxia we will continue with weaning on T collar patient's goal is for 16 hours today 2. COVID-19 virus infection recovery phase we will continue to follow along 3. Severe sepsis treated resolved 4. Tracheostomy remains in place we will continue to follow along   I have personally seen and evaluated the patient, evaluated laboratory and imaging results, formulated the assessment and plan and placed orders. The Patient requires high complexity decision making with multiple systems involvement.  Rounds were done with the Respiratory Therapy Director and Staff therapists and discussed with nursing staff also.  Yevonne Pax, MD [facility] Pulmonary Critical Care Medicine Sleep Medicine

## 2021-02-01 DIAGNOSIS — Z930 Tracheostomy status: Secondary | ICD-10-CM | POA: Diagnosis not present

## 2021-02-01 DIAGNOSIS — J9621 Acute and chronic respiratory failure with hypoxia: Secondary | ICD-10-CM | POA: Diagnosis not present

## 2021-02-01 DIAGNOSIS — U071 COVID-19: Secondary | ICD-10-CM | POA: Diagnosis not present

## 2021-02-01 DIAGNOSIS — A419 Sepsis, unspecified organism: Secondary | ICD-10-CM | POA: Diagnosis not present

## 2021-02-01 NOTE — Progress Notes (Signed)
Pulmonary Critical Care Medicine [facility] [place]   PULMONARY CRITICAL CARE SERVICE  PROGRESS NOTE  Date of Service: 02/01/2021  Lawrence Freeman  [id number]  DOB: [birth year]   DOA: 11/23/2020  Referring Physician: Carron Curie, MD  HPI: Lawrence Freeman is a 50 y.o. male seen for follow up of Acute on Chronic Respiratory Failure.  Patient currently is on T collar with a goal of 12 hours  Medications: Reviewed on Rounds  Physical Exam:  Vitals: Temperature is 96.8 pulse 77 respiratory 20 blood pressure is 104/62 saturations 99%  Ventilator Settings on T collar goal 12 hours  . General: Comfortable at this time . Eyes: Grossly normal lids, irises & conjunctiva . ENT: grossly tongue is normal . Neck: no obvious mass . Cardiovascular: S1 S2 normal no gallop . Respiratory: Scattered rhonchi expansion equal . Abdomen: soft . Skin: no rash seen on limited exam . Musculoskeletal: not rigid . Psychiatric:unable to assess . Neurologic: no seizure no involuntary movements         Lab Data:   Basic Metabolic Panel: Recent Labs  Lab 01/26/21 0343 01/30/21 0406  NA 138 135  K 4.5 3.9  CL 95* 95*  CO2 30 28  GLUCOSE 128* 180*  BUN 54* 50*  CREATININE 0.93 0.85  CALCIUM 10.1 9.9    ABG: No results for input(s): PHART, PCO2ART, PO2ART, HCO3, O2SAT in the last 168 hours.  Liver Function Tests: No results for input(s): AST, ALT, ALKPHOS, BILITOT, PROT, ALBUMIN in the last 168 hours. No results for input(s): LIPASE, AMYLASE in the last 168 hours. No results for input(s): AMMONIA in the last 168 hours.  CBC: Recent Labs  Lab 01/26/21 0343 01/30/21 0406  WBC 15.9* 10.2  HGB 12.5* 11.9*  HCT 38.1* 35.3*  MCV 96.5 95.7  PLT 254 246    Cardiac Enzymes: No results for input(s): CKTOTAL, CKMB, CKMBINDEX, TROPONINI in the last 168 hours.  BNP (last 3 results) No results for input(s): BNP in the last 8760 hours.  ProBNP (last 3 results) No  results for input(s): PROBNP in the last 8760 hours.  Radiological Exams: No results found.  Assessment/Plan Active Problems:   Acute on chronic respiratory failure with hypoxia (HCC)   COVID-19 virus infection   Tracheostomy status (HCC)   Severe sepsis (HCC)   1. Acute on chronic respiratory failure hypoxia we will continue with T collar trials with a 12-hour goal 2. COVID-19 virus infection recovery 3. Severe sepsis treated and resolved 4. Tracheostomy remains in place   I have personally seen and evaluated the patient, evaluated laboratory and imaging results, formulated the assessment and plan and placed orders. The Patient requires high complexity decision making with multiple systems involvement.  Rounds were done with the Respiratory Therapy Director and Staff therapists and discussed with nursing staff also.  Yevonne Pax, MD [facility] Pulmonary Critical Care Medicine Sleep Medicine

## 2021-02-02 DIAGNOSIS — J9621 Acute and chronic respiratory failure with hypoxia: Secondary | ICD-10-CM | POA: Diagnosis not present

## 2021-02-02 DIAGNOSIS — A419 Sepsis, unspecified organism: Secondary | ICD-10-CM | POA: Diagnosis not present

## 2021-02-02 DIAGNOSIS — Z930 Tracheostomy status: Secondary | ICD-10-CM | POA: Diagnosis not present

## 2021-02-02 DIAGNOSIS — U071 COVID-19: Secondary | ICD-10-CM | POA: Diagnosis not present

## 2021-02-02 NOTE — Progress Notes (Signed)
Pulmonary Critical Care Medicine [facility] [place]   PULMONARY CRITICAL CARE SERVICE  PROGRESS NOTE  Date of Service: 02/02/2021  Lawrence Freeman  KXF:[id number]  DOB: [birth year]   DOA: 11/23/2020  Referring Physician: Carron Curie, MD  HPI: Lawrence Freeman is a 50 y.o. male seen for follow up of Acute on Chronic Respiratory Failure.  Patient did approximately 10 hours of trach collar yesterday and now is back on the T collar  Medications: Reviewed on Rounds  Physical Exam:  Vitals: Temperature is 97.7 pulse 85 respiratory 22 blood pressure is 99/55 saturations 98%  Ventilator Settings off the ventilator on T collar  . General: Comfortable at this time . Eyes: Grossly normal lids, irises & conjunctiva . ENT: grossly tongue is normal . Neck: no obvious mass . Cardiovascular: S1 S2 normal no gallop . Respiratory: No rhonchi very coarse breath sounds . Abdomen: soft . Skin: no rash seen on limited exam . Musculoskeletal: not rigid . Psychiatric:unable to assess . Neurologic: no seizure no involuntary movements         Lab Data:   Basic Metabolic Panel: Recent Labs  Lab 01/30/21 0406  NA 135  K 3.9  CL 95*  CO2 28  GLUCOSE 180*  BUN 50*  CREATININE 0.85  CALCIUM 9.9    ABG: No results for input(s): PHART, PCO2ART, PO2ART, HCO3, O2SAT in the last 168 hours.  Liver Function Tests: No results for input(s): AST, ALT, ALKPHOS, BILITOT, PROT, ALBUMIN in the last 168 hours. No results for input(s): LIPASE, AMYLASE in the last 168 hours. No results for input(s): AMMONIA in the last 168 hours.  CBC: Recent Labs  Lab 01/30/21 0406  WBC 10.2  HGB 11.9*  HCT 35.3*  MCV 95.7  PLT 246    Cardiac Enzymes: No results for input(s): CKTOTAL, CKMB, CKMBINDEX, TROPONINI in the last 168 hours.  BNP (last 3 results) No results for input(s): BNP in the last 8760 hours.  ProBNP (last 3 results) No results for input(s): PROBNP in the last 8760  hours.  Radiological Exams: No results found.  Assessment/Plan Active Problems:   Acute on chronic respiratory failure with hypoxia (HCC)   COVID-19 virus infection   Tracheostomy status (HCC)   Severe sepsis (HCC)   1. Acute on chronic respiratory failure hypoxia plan is to continue to wean on T collar 2. COVID-19 virus infection recovery we will continue to follow 3. Severe sepsis treated 4. Tracheostomy remains in place   I have personally seen and evaluated the patient, evaluated laboratory and imaging results, formulated the assessment and plan and placed orders. The Patient requires high complexity decision making with multiple systems involvement.  Rounds were done with the Respiratory Therapy Director and Staff therapists and discussed with nursing staff also.  Yevonne Pax, MD [facility] Pulmonary Critical Care Medicine Sleep Medicine

## 2021-02-03 DIAGNOSIS — J9621 Acute and chronic respiratory failure with hypoxia: Secondary | ICD-10-CM | POA: Diagnosis not present

## 2021-02-03 DIAGNOSIS — U071 COVID-19: Secondary | ICD-10-CM | POA: Diagnosis not present

## 2021-02-03 DIAGNOSIS — Z930 Tracheostomy status: Secondary | ICD-10-CM | POA: Diagnosis not present

## 2021-02-03 DIAGNOSIS — A419 Sepsis, unspecified organism: Secondary | ICD-10-CM | POA: Diagnosis not present

## 2021-02-03 LAB — CBC
HCT: 35 % — ABNORMAL LOW (ref 39.0–52.0)
Hemoglobin: 11.9 g/dL — ABNORMAL LOW (ref 13.0–17.0)
MCH: 32.5 pg (ref 26.0–34.0)
MCHC: 34 g/dL (ref 30.0–36.0)
MCV: 95.6 fL (ref 80.0–100.0)
Platelets: 246 10*3/uL (ref 150–400)
RBC: 3.66 MIL/uL — ABNORMAL LOW (ref 4.22–5.81)
RDW: 14.6 % (ref 11.5–15.5)
WBC: 11 10*3/uL — ABNORMAL HIGH (ref 4.0–10.5)
nRBC: 0 % (ref 0.0–0.2)

## 2021-02-03 LAB — BASIC METABOLIC PANEL
Anion gap: 11 (ref 5–15)
BUN: 30 mg/dL — ABNORMAL HIGH (ref 6–20)
CO2: 31 mmol/L (ref 22–32)
Calcium: 9.6 mg/dL (ref 8.9–10.3)
Chloride: 95 mmol/L — ABNORMAL LOW (ref 98–111)
Creatinine, Ser: 0.86 mg/dL (ref 0.61–1.24)
GFR, Estimated: >60 mL/min (ref >60)
Glucose, Bld: 128 mg/dL — ABNORMAL HIGH (ref 70–99)
Potassium: 4.1 mmol/L (ref 3.5–5.1)
Sodium: 137 mmol/L (ref 135–145)

## 2021-02-03 NOTE — Progress Notes (Signed)
Pulmonary Critical Care Medicine [facility] [place]   PULMONARY CRITICAL CARE SERVICE  PROGRESS NOTE  Date of Service: 02/03/2021  Jakeb Lamping  [id number]:[id number]  DOB: [birth year]   DOA: 11/23/2020  Referring Physician: Carron Curie, MD  HPI: Deaunte Dente is a 50 y.o. male seen for follow up of Acute on Chronic Respiratory Failure.  Patient currently is on T collar with the goal today is for 24 hours  Medications: Reviewed on Rounds  Physical Exam:  Vitals: Temperature is 96.8 pulse 74 respiratory rate is 12 blood pressure is 101/65 saturations 99%  Ventilator Settings on T collar  . General: Comfortable at this time . Eyes: Grossly normal lids, irises & conjunctiva . ENT: grossly tongue is normal . Neck: no obvious mass . Cardiovascular: S1 S2 normal no gallop . Respiratory: No rhonchi no rales . Abdomen: soft . Skin: no rash seen on limited exam . Musculoskeletal: not rigid . Psychiatric:unable to assess . Neurologic: no seizure no involuntary movements         Lab Data:   Basic Metabolic Panel: Recent Labs  Lab 01/30/21 0406 02/03/21 0350  NA 135 137  K 3.9 4.1  CL 95* 95*  CO2 28 31  GLUCOSE 180* 128*  BUN 50* 30*  CREATININE 0.85 0.86  CALCIUM 9.9 9.6    ABG: No results for input(s): PHART, PCO2ART, PO2ART, HCO3, O2SAT in the last 168 hours.  Liver Function Tests: No results for input(s): AST, ALT, ALKPHOS, BILITOT, PROT, ALBUMIN in the last 168 hours. No results for input(s): LIPASE, AMYLASE in the last 168 hours. No results for input(s): AMMONIA in the last 168 hours.  CBC: Recent Labs  Lab 01/30/21 0406 02/03/21 0350  WBC 10.2 11.0*  HGB 11.9* 11.9*  HCT 35.3* 35.0*  MCV 95.7 95.6  PLT 246 246    Cardiac Enzymes: No results for input(s): CKTOTAL, CKMB, CKMBINDEX, TROPONINI in the last 168 hours.  BNP (last 3 results) No results for input(s): BNP in the last 8760 hours.  ProBNP (last 3 results) No results  for input(s): PROBNP in the last 8760 hours.  Radiological Exams: No results found.  Assessment/Plan Active Problems:   Acute on chronic respiratory failure with hypoxia (HCC)   COVID-19 virus infection   Tracheostomy status (HCC)   Severe sepsis (HCC)   1. Acute on chronic respiratory failure hypoxia we will continue with T collar trials titrate oxygen continue pulmonary toilet. 2. COVID-19 virus infection recovery we will continue to follow along. 3. Severe sepsis treated in resolution 4. Tracheostomy remains in place right now   I have personally seen and evaluated the patient, evaluated laboratory and imaging results, formulated the assessment and plan and placed orders. The Patient requires high complexity decision making with multiple systems involvement.  Rounds were done with the Respiratory Therapy Director and Staff therapists and discussed with nursing staff also.  Yevonne Pax, MD [facility] Pulmonary Critical Care Medicine Sleep Medicine

## 2021-02-05 DIAGNOSIS — J9621 Acute and chronic respiratory failure with hypoxia: Secondary | ICD-10-CM | POA: Diagnosis not present

## 2021-02-05 DIAGNOSIS — Z930 Tracheostomy status: Secondary | ICD-10-CM | POA: Diagnosis not present

## 2021-02-05 DIAGNOSIS — A419 Sepsis, unspecified organism: Secondary | ICD-10-CM | POA: Diagnosis not present

## 2021-02-05 DIAGNOSIS — U071 COVID-19: Secondary | ICD-10-CM | POA: Diagnosis not present

## 2021-02-05 NOTE — Progress Notes (Signed)
Pulmonary Critical Care Medicine [facility] [place]   PULMONARY CRITICAL CARE SERVICE  PROGRESS NOTE  Date of Service: 02/05/2021  Lawrence Freeman  [id number]  DOB: [birth year]   DOA: 11/23/2020  Referring Physician: Carron Curie, MD  HPI: Lawrence Freeman is a 50 y.o. male seen for follow up of Acute on Chronic Respiratory Failure.  Patient currently is on 30% FiO2 goal today is for 20 hours  Medications: Reviewed on Rounds  Physical Exam:  Vitals: Temperature 97.4 pulse 92 respiratory 20 blood pressure is 114/62 saturations 99%  Ventilator Settings on T collar FiO2 30%  . General: Comfortable at this time . Eyes: Grossly normal lids, irises & conjunctiva . ENT: grossly tongue is normal . Neck: no obvious mass . Cardiovascular: S1 S2 normal no gallop . Respiratory: Coarse breath sounds few scattered rhonchi . Abdomen: soft . Skin: no rash seen on limited exam . Musculoskeletal: not rigid . Psychiatric:unable to assess . Neurologic: no seizure no involuntary movements         Lab Data:   Basic Metabolic Panel: Recent Labs  Lab 01/30/21 0406 02/03/21 0350  NA 135 137  K 3.9 4.1  CL 95* 95*  CO2 28 31  GLUCOSE 180* 128*  BUN 50* 30*  CREATININE 0.85 0.86  CALCIUM 9.9 9.6    ABG: No results for input(s): PHART, PCO2ART, PO2ART, HCO3, O2SAT in the last 168 hours.  Liver Function Tests: No results for input(s): AST, ALT, ALKPHOS, BILITOT, PROT, ALBUMIN in the last 168 hours. No results for input(s): LIPASE, AMYLASE in the last 168 hours. No results for input(s): AMMONIA in the last 168 hours.  CBC: Recent Labs  Lab 01/30/21 0406 02/03/21 0350  WBC 10.2 11.0*  HGB 11.9* 11.9*  HCT 35.3* 35.0*  MCV 95.7 95.6  PLT 246 246    Cardiac Enzymes: No results for input(s): CKTOTAL, CKMB, CKMBINDEX, TROPONINI in the last 168 hours.  BNP (last 3 results) No results for input(s): BNP in the last 8760 hours.  ProBNP (last 3  results) No results for input(s): PROBNP in the last 8760 hours.  Radiological Exams: No results found.  Assessment/Plan Active Problems:   Acute on chronic respiratory failure with hypoxia (HCC)   COVID-19 virus infection   Tracheostomy status (HCC)   Severe sepsis (HCC)   1. Acute on chronic respiratory failure hypoxia we will continue with T collar trials titrate oxygen continue pulmonary toilet. 2. COVID-19 virus infection recovery phase we will continue to follow 3. Severe sepsis treated resolved 4. Tracheostomy remains in place   I have personally seen and evaluated the patient, evaluated laboratory and imaging results, formulated the assessment and plan and placed orders. The Patient requires high complexity decision making with multiple systems involvement.  Rounds were done with the Respiratory Therapy Director and Staff therapists and discussed with nursing staff also.  Yevonne Pax, MD [facility] Pulmonary Critical Care Medicine Sleep Medicine

## 2021-02-06 ENCOUNTER — Other Ambulatory Visit (HOSPITAL_COMMUNITY): Payer: Self-pay

## 2021-02-06 DIAGNOSIS — U071 COVID-19: Secondary | ICD-10-CM | POA: Diagnosis not present

## 2021-02-06 DIAGNOSIS — A419 Sepsis, unspecified organism: Secondary | ICD-10-CM | POA: Diagnosis not present

## 2021-02-06 DIAGNOSIS — Z930 Tracheostomy status: Secondary | ICD-10-CM | POA: Diagnosis not present

## 2021-02-06 DIAGNOSIS — J9621 Acute and chronic respiratory failure with hypoxia: Secondary | ICD-10-CM | POA: Diagnosis not present

## 2021-02-06 LAB — CBC
HCT: 42 % (ref 39.0–52.0)
Hemoglobin: 14.3 g/dL (ref 13.0–17.0)
MCH: 32.4 pg (ref 26.0–34.0)
MCHC: 34 g/dL (ref 30.0–36.0)
MCV: 95 fL (ref 80.0–100.0)
Platelets: 247 10*3/uL (ref 150–400)
RBC: 4.42 MIL/uL (ref 4.22–5.81)
RDW: 14.2 % (ref 11.5–15.5)
WBC: 11.5 10*3/uL — ABNORMAL HIGH (ref 4.0–10.5)
nRBC: 0 % (ref 0.0–0.2)

## 2021-02-06 LAB — BASIC METABOLIC PANEL
Anion gap: 11 (ref 5–15)
BUN: 29 mg/dL — ABNORMAL HIGH (ref 6–20)
CO2: 33 mmol/L — ABNORMAL HIGH (ref 22–32)
Calcium: 10.1 mg/dL (ref 8.9–10.3)
Chloride: 95 mmol/L — ABNORMAL LOW (ref 98–111)
Creatinine, Ser: 0.88 mg/dL (ref 0.61–1.24)
GFR, Estimated: >60 mL/min (ref >60)
Glucose, Bld: 143 mg/dL — ABNORMAL HIGH (ref 70–99)
Potassium: 4.1 mmol/L (ref 3.5–5.1)
Sodium: 139 mmol/L (ref 135–145)

## 2021-02-06 NOTE — Progress Notes (Signed)
Pulmonary Critical Care Medicine [facility] [place]   PULMONARY CRITICAL CARE SERVICE  PROGRESS NOTE  Date of Service: 02/06/2021  Lawrence Freeman  [id number]  DOB: 11-Nov-1971   DOA: 11/23/2020  Referring Physician: Carron Curie, MD  HPI: Lawrence Freeman is a 50 y.o. male seen for follow up of Acute on Chronic Respiratory Failure.  Patient is on T collar 28% goal is for 20 hours today did 16 hours yesterday  Medications: Reviewed on Rounds  Physical Exam:  Vitals: Temperature 96.1 pulse 67 respiratory 23 blood pressure is 115/73 saturations 100%  Ventilator Settings T collar FiO2 28%  . General: Comfortable at this time . Eyes: Grossly normal lids, irises & conjunctiva . ENT: grossly tongue is normal . Neck: no obvious mass . Cardiovascular: S1 S2 normal no gallop . Respiratory: Scattered rhonchi expansion is equal . Abdomen: soft . Skin: no rash seen on limited exam . Musculoskeletal: not rigid . Psychiatric:unable to assess . Neurologic: no seizure no involuntary movements         Lab Data:   Basic Metabolic Panel: Recent Labs  Lab 02/03/21 0350 02/06/21 0357  NA 137 139  K 4.1 4.1  CL 95* 95*  CO2 31 33*  GLUCOSE 128* 143*  BUN 30* 29*  CREATININE 0.86 0.88  CALCIUM 9.6 10.1    ABG: No results for input(s): PHART, PCO2ART, PO2ART, HCO3, O2SAT in the last 168 hours.  Liver Function Tests: No results for input(s): AST, ALT, ALKPHOS, BILITOT, PROT, ALBUMIN in the last 168 hours. No results for input(s): LIPASE, AMYLASE in the last 168 hours. No results for input(s): AMMONIA in the last 168 hours.  CBC: Recent Labs  Lab 02/03/21 0350 02/06/21 0357  WBC 11.0* 11.5*  HGB 11.9* 14.3  HCT 35.0* 42.0  MCV 95.6 95.0  PLT 246 247    Cardiac Enzymes: No results for input(s): CKTOTAL, CKMB, CKMBINDEX, TROPONINI in the last 168 hours.  BNP (last 3 results) No results for input(s): BNP in the last 8760 hours.  ProBNP (last  3 results) No results for input(s): PROBNP in the last 8760 hours.  Radiological Exams: No results found.  Assessment/Plan Active Problems:   Acute on chronic respiratory failure with hypoxia (HCC)   COVID-19 virus infection   Tracheostomy status (HCC)   Severe sepsis (HCC)   1. Acute on chronic respiratory failure hypoxia continue with T collar titrate oxygen continue pulmonary toilet.  The goal is 20 hours 2. COVID-19 virus infection with Covid 3. Severe sepsis treated resolved 4. Tracheostomy remains in place   I have personally seen and evaluated the patient, evaluated laboratory and imaging results, formulated the assessment and plan and placed orders. The Patient requires high complexity decision making with multiple systems involvement.  Rounds were done with the Respiratory Therapy Director and Staff therapists and discussed with nursing staff also.  Yevonne Pax, MD [facility] Pulmonary Critical Care Medicine Sleep Medicine

## 2021-02-07 DIAGNOSIS — Z930 Tracheostomy status: Secondary | ICD-10-CM | POA: Diagnosis not present

## 2021-02-07 DIAGNOSIS — J9621 Acute and chronic respiratory failure with hypoxia: Secondary | ICD-10-CM | POA: Diagnosis not present

## 2021-02-07 DIAGNOSIS — U071 COVID-19: Secondary | ICD-10-CM | POA: Diagnosis not present

## 2021-02-07 DIAGNOSIS — A419 Sepsis, unspecified organism: Secondary | ICD-10-CM | POA: Diagnosis not present

## 2021-02-07 NOTE — Progress Notes (Signed)
Pulmonary Critical Care Medicine [facility] [place]   PULMONARY CRITICAL CARE SERVICE  PROGRESS NOTE  Date of Service: 02/07/2021  Lawrence Freeman  [id number]:[id number]  DOB: 1971-08-06   DOA: 11/23/2020  Referring Physician: Carron Curie, MD  HPI: Lawrence Freeman is a 50 y.o. male seen for follow up of Acute on Chronic Respiratory Failure.  Patient is on T collar on 35% FiO2 good saturations are noted  Medications: Reviewed on Rounds  Physical Exam:  Vitals: Temperature 98.7 pulse 98 respiratory rate is 20 blood pressure is 122/74 saturations 99%  Ventilator Settings on T collar with an FiO2 35%  . General: Comfortable at this time . Eyes: Grossly normal lids, irises & conjunctiva . ENT: grossly tongue is normal . Neck: no obvious mass . Cardiovascular: S1 S2 normal no gallop . Respiratory: Scattered rhonchi very coarse breath sounds . Abdomen: soft . Skin: no rash seen on limited exam . Musculoskeletal: not rigid . Psychiatric:unable to assess . Neurologic: no seizure no involuntary movements         Lab Data:   Basic Metabolic Panel: Recent Labs  Lab 02/03/21 0350 02/06/21 0357  NA 137 139  K 4.1 4.1  CL 95* 95*  CO2 31 33*  GLUCOSE 128* 143*  BUN 30* 29*  CREATININE 0.86 0.88  CALCIUM 9.6 10.1    ABG: No results for input(s): PHART, PCO2ART, PO2ART, HCO3, O2SAT in the last 168 hours.  Liver Function Tests: No results for input(s): AST, ALT, ALKPHOS, BILITOT, PROT, ALBUMIN in the last 168 hours. No results for input(s): LIPASE, AMYLASE in the last 168 hours. No results for input(s): AMMONIA in the last 168 hours.  CBC: Recent Labs  Lab 02/03/21 0350 02/06/21 0357  WBC 11.0* 11.5*  HGB 11.9* 14.3  HCT 35.0* 42.0  MCV 95.6 95.0  PLT 246 247    Cardiac Enzymes: No results for input(s): CKTOTAL, CKMB, CKMBINDEX, TROPONINI in the last 168 hours.  BNP (last 3 results) No results for input(s): BNP in the last 8760  hours.  ProBNP (last 3 results) No results for input(s): PROBNP in the last 8760 hours.  Radiological Exams: No results found.  Assessment/Plan Active Problems:   Acute on chronic respiratory failure with hypoxia (HCC)   COVID-19 virus infection   Tracheostomy status (HCC)   Severe sepsis (HCC)   1. Acute on chronic respiratory failure hypoxia we will continue with T collar trials on 35% FiO2 2. COVID-19 virus infection in recovery we will continue to follow 3. Severe sepsis treated in resolution 4. Tracheostomy remains in place   I have personally seen and evaluated the patient, evaluated laboratory and imaging results, formulated the assessment and plan and placed orders. The Patient requires high complexity decision making with multiple systems involvement.  Rounds were done with the Respiratory Therapy Director and Staff therapists and discussed with nursing staff also.  Yevonne Pax, MD [facility] Pulmonary Critical Care Medicine Sleep Medicine

## 2021-02-08 DIAGNOSIS — A419 Sepsis, unspecified organism: Secondary | ICD-10-CM | POA: Diagnosis not present

## 2021-02-08 DIAGNOSIS — J9621 Acute and chronic respiratory failure with hypoxia: Secondary | ICD-10-CM | POA: Diagnosis not present

## 2021-02-08 DIAGNOSIS — Z930 Tracheostomy status: Secondary | ICD-10-CM | POA: Diagnosis not present

## 2021-02-08 DIAGNOSIS — U071 COVID-19: Secondary | ICD-10-CM | POA: Diagnosis not present

## 2021-02-08 NOTE — Progress Notes (Signed)
Pulmonary Critical Care Medicine [facility] [place]   PULMONARY CRITICAL CARE SERVICE  PROGRESS NOTE  Date of Service: 02/08/2021  Lawrence Freeman  [id number]:[id number]  DOB: [birth year]   DOA: 11/23/2020  Referring Physician: Carron Curie, MD  HPI: Lawrence Freeman is a 50 y.o. male seen for follow up of Acute on Chronic Respiratory Failure.  Patient currently is on T collar seems to be comfortable without distress at this time  Medications: Reviewed on Rounds  Physical Exam:  Vitals: Temperature is 98.1 pulse 96 respiratory 23 blood pressure is 112/74 saturations 100%  Ventilator Settings off the ventilator on T collar  . General: Comfortable at this time . Eyes: Grossly normal lids, irises & conjunctiva . ENT: grossly tongue is normal . Neck: no obvious mass . Cardiovascular: S1 S2 normal no gallop . Respiratory: Scattered rhonchi expansion is equal at this time . Abdomen: soft . Skin: no rash seen on limited exam . Musculoskeletal: not rigid . Psychiatric:unable to assess . Neurologic: no seizure no involuntary movements         Lab Data:   Basic Metabolic Panel: Recent Labs  Lab 02/03/21 0350 02/06/21 0357  NA 137 139  K 4.1 4.1  CL 95* 95*  CO2 31 33*  GLUCOSE 128* 143*  BUN 30* 29*  CREATININE 0.86 0.88  CALCIUM 9.6 10.1    ABG: No results for input(s): PHART, PCO2ART, PO2ART, HCO3, O2SAT in the last 168 hours.  Liver Function Tests: No results for input(s): AST, ALT, ALKPHOS, BILITOT, PROT, ALBUMIN in the last 168 hours. No results for input(s): LIPASE, AMYLASE in the last 168 hours. No results for input(s): AMMONIA in the last 168 hours.  CBC: Recent Labs  Lab 02/03/21 0350 02/06/21 0357  WBC 11.0* 11.5*  HGB 11.9* 14.3  HCT 35.0* 42.0  MCV 95.6 95.0  PLT 246 247    Cardiac Enzymes: No results for input(s): CKTOTAL, CKMB, CKMBINDEX, TROPONINI in the last 168 hours.  BNP (last 3 results) No results for input(s): BNP  in the last 8760 hours.  ProBNP (last 3 results) No results for input(s): PROBNP in the last 8760 hours.  Radiological Exams: No results found.  Assessment/Plan Active Problems:   Acute on chronic respiratory failure with hypoxia (HCC)   COVID-19 virus infection   Tracheostomy status (HCC)   Severe sepsis (HCC)   1. Acute on chronic respiratory failure hypoxia we will try capping trials 2. Severe sepsis treated resolved 3. COVID-19 virus infection treated we will continue with supportive care 4. Tracheostomy remains in place we will try capping   I have personally seen and evaluated the patient, evaluated laboratory and imaging results, formulated the assessment and plan and placed orders. The Patient requires high complexity decision making with multiple systems involvement.  Rounds were done with the Respiratory Therapy Director and Staff therapists and discussed with nursing staff also.  Yevonne Pax, MD [facility] Pulmonary Critical Care Medicine Sleep Medicine

## 2021-02-09 DIAGNOSIS — A419 Sepsis, unspecified organism: Secondary | ICD-10-CM | POA: Diagnosis not present

## 2021-02-09 DIAGNOSIS — J9621 Acute and chronic respiratory failure with hypoxia: Secondary | ICD-10-CM | POA: Diagnosis not present

## 2021-02-09 DIAGNOSIS — Z930 Tracheostomy status: Secondary | ICD-10-CM | POA: Diagnosis not present

## 2021-02-09 DIAGNOSIS — U071 COVID-19: Secondary | ICD-10-CM | POA: Diagnosis not present

## 2021-02-09 NOTE — Progress Notes (Signed)
Pulmonary Critical Care Medicine [facility] [place]   PULMONARY CRITICAL CARE SERVICE  PROGRESS NOTE  Date of Service: 02/09/2021  Lawrence Freeman  [id number]:[id number]  DOB: [birth year]   DOA: 11/23/2020  Referring Physician: Carron Curie, MD  HPI: Lawrence Freeman is a 50 y.o. male seen for follow up of Acute on Chronic Respiratory Failure.  We started capping and yesterday today is completing 24 hours he states he does not want to go back on the ventilator as he feels this is more comfortable  Medications: Reviewed on Rounds  Physical Exam:  Vitals: Temperature is 97.8 pulse 71 respiratory rate 16 blood pressure is 93/51 saturations 100%  Ventilator Settings capping off the ventilator  . General: Comfortable at this time . Eyes: Grossly normal lids, irises & conjunctiva . ENT: grossly tongue is normal . Neck: no obvious mass . Cardiovascular: S1 S2 normal no gallop . Respiratory: No rhonchi very coarse breath sounds . Abdomen: soft . Skin: no rash seen on limited exam . Musculoskeletal: not rigid . Psychiatric:unable to assess . Neurologic: no seizure no involuntary movements         Lab Data:   Basic Metabolic Panel: Recent Labs  Lab 02/03/21 0350 02/06/21 0357  NA 137 139  K 4.1 4.1  CL 95* 95*  CO2 31 33*  GLUCOSE 128* 143*  BUN 30* 29*  CREATININE 0.86 0.88  CALCIUM 9.6 10.1    ABG: No results for input(s): PHART, PCO2ART, PO2ART, HCO3, O2SAT in the last 168 hours.  Liver Function Tests: No results for input(s): AST, ALT, ALKPHOS, BILITOT, PROT, ALBUMIN in the last 168 hours. No results for input(s): LIPASE, AMYLASE in the last 168 hours. No results for input(s): AMMONIA in the last 168 hours.  CBC: Recent Labs  Lab 02/03/21 0350 02/06/21 0357  WBC 11.0* 11.5*  HGB 11.9* 14.3  HCT 35.0* 42.0  MCV 95.6 95.0  PLT 246 247    Cardiac Enzymes: No results for input(s): CKTOTAL, CKMB, CKMBINDEX, TROPONINI in the last 168  hours.  BNP (last 3 results) No results for input(s): BNP in the last 8760 hours.  ProBNP (last 3 results) No results for input(s): PROBNP in the last 8760 hours.  Radiological Exams: No results found.  Assessment/Plan Active Problems:   Acute on chronic respiratory failure with hypoxia (HCC)   COVID-19 virus infection   Tracheostomy status (HCC)   Severe sepsis (HCC)   1. Acute on chronic respiratory failure with hypoxia we will continue with capping as tolerated. 2. COVID-19 virus infection recovery 3. Tracheostomy supportive care 4. Severe sepsis treated resolved   I have personally seen and evaluated the patient, evaluated laboratory and imaging results, formulated the assessment and plan and placed orders. The Patient requires high complexity decision making with multiple systems involvement.  Rounds were done with the Respiratory Therapy Director and Staff therapists and discussed with nursing staff also.  Yevonne Pax, MD [facility] Pulmonary Critical Care Medicine Sleep Medicine

## 2021-02-10 DIAGNOSIS — U071 COVID-19: Secondary | ICD-10-CM | POA: Diagnosis not present

## 2021-02-10 DIAGNOSIS — Z930 Tracheostomy status: Secondary | ICD-10-CM | POA: Diagnosis not present

## 2021-02-10 DIAGNOSIS — A419 Sepsis, unspecified organism: Secondary | ICD-10-CM | POA: Diagnosis not present

## 2021-02-10 DIAGNOSIS — J9621 Acute and chronic respiratory failure with hypoxia: Secondary | ICD-10-CM | POA: Diagnosis not present

## 2021-02-10 NOTE — Progress Notes (Signed)
Pulmonary Critical Care Medicine [facility] [place]   PULMONARY CRITICAL CARE SERVICE  PROGRESS NOTE  Date of Service: 02/10/2021  Lawrence Freeman  [id number]  DOB: 12-30-1970   DOA: 11/23/2020  Referring Physician: Carron Curie, MD  HPI: Lawrence Freeman is a 50 y.o. male seen for follow up of Acute on Chronic Respiratory Failure.  Currently is capping today will be 48 hours he is doing quite well.  Patient is eligible for acute rehab  Medications: Reviewed on Rounds  Physical Exam:  Vitals: Temperature is 97.3 pulse 75 respiratory 27 blood pressure is 102/63 saturations 100%  Ventilator Settings capping off the ventilator  . General: Comfortable at this time . Eyes: Grossly normal lids, irises & conjunctiva . ENT: grossly tongue is normal . Neck: no obvious mass . Cardiovascular: S1 S2 normal no gallop . Respiratory: No rhonchi very coarse breath sounds . Abdomen: soft . Skin: no rash seen on limited exam . Musculoskeletal: not rigid . Psychiatric:unable to assess . Neurologic: no seizure no involuntary movements         Lab Data:   Basic Metabolic Panel: Recent Labs  Lab 02/06/21 0357  NA 139  K 4.1  CL 95*  CO2 33*  GLUCOSE 143*  BUN 29*  CREATININE 0.88  CALCIUM 10.1    ABG: No results for input(s): PHART, PCO2ART, PO2ART, HCO3, O2SAT in the last 168 hours.  Liver Function Tests: No results for input(s): AST, ALT, ALKPHOS, BILITOT, PROT, ALBUMIN in the last 168 hours. No results for input(s): LIPASE, AMYLASE in the last 168 hours. No results for input(s): AMMONIA in the last 168 hours.  CBC: Recent Labs  Lab 02/06/21 0357  WBC 11.5*  HGB 14.3  HCT 42.0  MCV 95.0  PLT 247    Cardiac Enzymes: No results for input(s): CKTOTAL, CKMB, CKMBINDEX, TROPONINI in the last 168 hours.  BNP (last 3 results) No results for input(s): BNP in the last 8760 hours.  ProBNP (last 3 results) No results for input(s): PROBNP in the  last 8760 hours.  Radiological Exams: No results found.  Assessment/Plan Active Problems:   Acute on chronic respiratory failure with hypoxia (HCC)   COVID-19 virus infection   Tracheostomy status (HCC)   Severe sepsis (HCC)   1. Acute on chronic respiratory failure hypoxia we will continue with capping trials goal of 48 hours today 2. COVID-19 virus infection recovery 3. Severe sepsis treated 4. Tracheostomy at this time the patient states the rehab facility would like to keep the tracheostomy and I have spoken with case management they will look further into this   I have personally seen and evaluated the patient, evaluated laboratory and imaging results, formulated the assessment and plan and placed orders. The Patient requires high complexity decision making with multiple systems involvement.  Rounds were done with the Respiratory Therapy Director and Staff therapists and discussed with nursing staff also.  Yevonne Pax, MD [facility] Pulmonary Critical Care Medicine Sleep Medicine

## 2021-02-11 DIAGNOSIS — A419 Sepsis, unspecified organism: Secondary | ICD-10-CM | POA: Diagnosis not present

## 2021-02-11 DIAGNOSIS — J9621 Acute and chronic respiratory failure with hypoxia: Secondary | ICD-10-CM | POA: Diagnosis not present

## 2021-02-11 DIAGNOSIS — U071 COVID-19: Secondary | ICD-10-CM | POA: Diagnosis not present

## 2021-02-11 DIAGNOSIS — Z930 Tracheostomy status: Secondary | ICD-10-CM | POA: Diagnosis not present

## 2021-02-11 NOTE — Progress Notes (Signed)
Pulmonary Critical Care Medicine [facility] [place]   PULMONARY CRITICAL CARE SERVICE  PROGRESS NOTE  Date of Service: 02/11/2021  Anais Koenen  NOM:[id number]  DOB: [birth year]   DOA: 11/23/2020  Referring Physician: Carron Curie, MD  HPI: Johntavius Shepard is a 50 y.o. male seen for follow up of Acute on Chronic Respiratory Failure.  Remains capping doing well on 1 L O2 awaiting discharge plan  Medications: Reviewed on Rounds  Physical Exam:  Vitals: Temperature 96.3 pulse 79 respiratory rate is 22 blood pressure is 116/84 saturations 100%  Ventilator Settings capping off the ventilator  . General: Comfortable at this time . Eyes: Grossly normal lids, irises & conjunctiva . ENT: grossly tongue is normal . Neck: no obvious mass . Cardiovascular: S1 S2 normal no gallop . Respiratory: No rhonchi very coarse breath sounds . Abdomen: soft . Skin: no rash seen on limited exam . Musculoskeletal: not rigid . Psychiatric:unable to assess . Neurologic: no seizure no involuntary movements         Lab Data:   Basic Metabolic Panel: Recent Labs  Lab 02/06/21 0357  NA 139  K 4.1  CL 95*  CO2 33*  GLUCOSE 143*  BUN 29*  CREATININE 0.88  CALCIUM 10.1    ABG: No results for input(s): PHART, PCO2ART, PO2ART, HCO3, O2SAT in the last 168 hours.  Liver Function Tests: No results for input(s): AST, ALT, ALKPHOS, BILITOT, PROT, ALBUMIN in the last 168 hours. No results for input(s): LIPASE, AMYLASE in the last 168 hours. No results for input(s): AMMONIA in the last 168 hours.  CBC: Recent Labs  Lab 02/06/21 0357  WBC 11.5*  HGB 14.3  HCT 42.0  MCV 95.0  PLT 247    Cardiac Enzymes: No results for input(s): CKTOTAL, CKMB, CKMBINDEX, TROPONINI in the last 168 hours.  BNP (last 3 results) No results for input(s): BNP in the last 8760 hours.  ProBNP (last 3 results) No results for input(s): PROBNP in the last 8760 hours.  Radiological  Exams: No results found.  Assessment/Plan Active Problems:   Acute on chronic respiratory failure with hypoxia (HCC)   COVID-19 virus infection   Tracheostomy status (HCC)   Severe sepsis (HCC)   1. Acute on chronic respiratory failure hypoxia plan is to continue with capping trials. 2. COVID-19 virus infection in recovery 3. Tracheostomy remains in place 4. Severe sepsis treated resolved   I have personally seen and evaluated the patient, evaluated laboratory and imaging results, formulated the assessment and plan and placed orders. The Patient requires high complexity decision making with multiple systems involvement.  Rounds were done with the Respiratory Therapy Director and Staff therapists and discussed with nursing staff also.  Yevonne Pax, MD [facility] Pulmonary Critical Care Medicine Sleep Medicine

## 2021-02-12 ENCOUNTER — Other Ambulatory Visit (HOSPITAL_COMMUNITY): Payer: Self-pay

## 2021-02-12 DIAGNOSIS — U071 COVID-19: Secondary | ICD-10-CM | POA: Diagnosis not present

## 2021-02-12 DIAGNOSIS — J9621 Acute and chronic respiratory failure with hypoxia: Secondary | ICD-10-CM | POA: Diagnosis not present

## 2021-02-12 DIAGNOSIS — A419 Sepsis, unspecified organism: Secondary | ICD-10-CM | POA: Diagnosis not present

## 2021-02-12 DIAGNOSIS — Z930 Tracheostomy status: Secondary | ICD-10-CM | POA: Diagnosis not present

## 2021-02-12 LAB — BASIC METABOLIC PANEL
Anion gap: 16 — ABNORMAL HIGH (ref 5–15)
BUN: 28 mg/dL — ABNORMAL HIGH (ref 6–20)
CO2: 32 mmol/L (ref 22–32)
Calcium: 10.7 mg/dL — ABNORMAL HIGH (ref 8.9–10.3)
Chloride: 93 mmol/L — ABNORMAL LOW (ref 98–111)
Creatinine, Ser: 0.76 mg/dL (ref 0.61–1.24)
GFR, Estimated: >60 mL/min (ref >60)
Glucose, Bld: 110 mg/dL — ABNORMAL HIGH (ref 70–99)
Potassium: 4.7 mmol/L (ref 3.5–5.1)
Sodium: 141 mmol/L (ref 135–145)

## 2021-02-12 LAB — CBC
HCT: 41.7 % (ref 39.0–52.0)
Hemoglobin: 13.8 g/dL (ref 13.0–17.0)
MCH: 31.9 pg (ref 26.0–34.0)
MCHC: 33.1 g/dL (ref 30.0–36.0)
MCV: 96.5 fL (ref 80.0–100.0)
Platelets: 226 10*3/uL (ref 150–400)
RBC: 4.32 MIL/uL (ref 4.22–5.81)
RDW: 13.9 % (ref 11.5–15.5)
WBC: 13.1 10*3/uL — ABNORMAL HIGH (ref 4.0–10.5)
nRBC: 0 % (ref 0.0–0.2)

## 2021-02-12 NOTE — Progress Notes (Signed)
Pulmonary Critical Care Medicine [facility] [place]   PULMONARY CRITICAL CARE SERVICE  PROGRESS NOTE  Date of Service: 02/12/2021  Lawrence Freeman  [id number]  DOB: 07/31/1971   DOA: 11/23/2020  Referring Physician: Carron Curie, MD  HPI: Lawrence Freeman is a 50 y.o. male seen for follow up of Acute on Chronic Respiratory Failure.  Patient is on capping trials right now is on 1 L of oxygen.  Medications: Reviewed on Rounds  Physical Exam:  Vitals: Temperature 97.4 pulse 69 respiratory 14 blood pressure is 120/80 saturations 100%  Ventilator Settings capping on 1 L  . General: Comfortable at this time . Eyes: Grossly normal lids, irises & conjunctiva . ENT: grossly tongue is normal . Neck: no obvious mass . Cardiovascular: S1 S2 normal no gallop . Respiratory: No rhonchi no rales noted at this time . Abdomen: soft . Skin: no rash seen on limited exam . Musculoskeletal: not rigid . Psychiatric:unable to assess . Neurologic: no seizure no involuntary movements         Lab Data:   Basic Metabolic Panel: Recent Labs  Lab 02/06/21 0357 02/12/21 0613  NA 139 141  K 4.1 4.7  CL 95* 93*  CO2 33* 32  GLUCOSE 143* 110*  BUN 29* 28*  CREATININE 0.88 0.76  CALCIUM 10.1 10.7*    ABG: No results for input(s): PHART, PCO2ART, PO2ART, HCO3, O2SAT in the last 168 hours.  Liver Function Tests: No results for input(s): AST, ALT, ALKPHOS, BILITOT, PROT, ALBUMIN in the last 168 hours. No results for input(s): LIPASE, AMYLASE in the last 168 hours. No results for input(s): AMMONIA in the last 168 hours.  CBC: Recent Labs  Lab 02/06/21 0357 02/12/21 0613  WBC 11.5* 13.1*  HGB 14.3 13.8  HCT 42.0 41.7  MCV 95.0 96.5  PLT 247 226    Cardiac Enzymes: No results for input(s): CKTOTAL, CKMB, CKMBINDEX, TROPONINI in the last 168 hours.  BNP (last 3 results) No results for input(s): BNP in the last 8760 hours.  ProBNP (last 3 results) No  results for input(s): PROBNP in the last 8760 hours.  Radiological Exams: DG CHEST PORT 1 VIEW  Result Date: 02/12/2021 CLINICAL DATA:  Pneumonia follow-up EXAM: PORTABLE CHEST 1 VIEW COMPARISON:  Eighteen days ago FINDINGS: Low volume chest with coarse interstitial opacities. There is fibrosis by October 2021 chest CT. Borderline heart size. Tracheostomy tube in place. Percutaneous gastrostomy tube in place. 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 No interval change. Electronically Signed   By: Marnee Spring M.D.   On: 02/12/2021 08:00    Assessment/Plan Active Problems:   Acute on chronic respiratory failure with hypoxia (HCC)   COVID-19 virus infection   Tracheostomy status (HCC)   Severe sepsis (HCC)   1. Acute on chronic respiratory failure hypoxia we will continue with capping trials titrate oxygen continue pulmonary toilet 2. COVID-19 virus infection recovery phase 3. Tracheostomy remains in place 4. Severe sepsis resolved   I have personally seen and evaluated the patient, evaluated laboratory and imaging results, formulated the assessment and plan and placed orders. The Patient requires high complexity decision making with multiple systems involvement.  Rounds were done with the Respiratory Therapy Director and Staff therapists and discussed with nursing staff also.  Yevonne Pax, MD [facility] Pulmonary Critical Care Medicine Sleep Medicine

## 2021-02-13 DIAGNOSIS — Z930 Tracheostomy status: Secondary | ICD-10-CM | POA: Diagnosis not present

## 2021-02-13 DIAGNOSIS — J9621 Acute and chronic respiratory failure with hypoxia: Secondary | ICD-10-CM | POA: Diagnosis not present

## 2021-02-13 DIAGNOSIS — U071 COVID-19: Secondary | ICD-10-CM | POA: Diagnosis not present

## 2021-02-13 DIAGNOSIS — A419 Sepsis, unspecified organism: Secondary | ICD-10-CM | POA: Diagnosis not present

## 2021-02-13 NOTE — Progress Notes (Signed)
Pulmonary Critical Care Medicine [facility] [place]   PULMONARY CRITICAL CARE SERVICE  PROGRESS NOTE  Date of Service: 02/13/2021  Lawrence Freeman  QPY:[id number]  DOB: 1971/11/04   DOA: 11/23/2020  Referring Physician: Carron Curie, MD  HPI: Lawrence Freeman is a 50 y.o. male seen for follow up of Acute on Chronic Respiratory Failure.  Currently capping is on 1 L O2  Medications: Reviewed on Rounds  Physical Exam:  Vitals: Temperature is 97.1 pulse 84 respiratory rate is 19 blood pressure 113/68 saturations 100%  Ventilator Settings capping on 1 L O2  . General: Comfortable at this time . Eyes: Grossly normal lids, irises & conjunctiva . ENT: grossly tongue is normal . Neck: no obvious mass . Cardiovascular: S1 S2 normal no gallop . Respiratory: Scattered rhonchi expansion is equal . Abdomen: soft . Skin: no rash seen on limited exam . Musculoskeletal: not rigid . Psychiatric:unable to assess . Neurologic: no seizure no involuntary movements         Lab Data:   Basic Metabolic Panel: Recent Labs  Lab 02/12/21 0613  NA 141  K 4.7  CL 93*  CO2 32  GLUCOSE 110*  BUN 28*  CREATININE 0.76  CALCIUM 10.7*    ABG: No results for input(s): PHART, PCO2ART, PO2ART, HCO3, O2SAT in the last 168 hours.  Liver Function Tests: No results for input(s): AST, ALT, ALKPHOS, BILITOT, PROT, ALBUMIN in the last 168 hours. No results for input(s): LIPASE, AMYLASE in the last 168 hours. No results for input(s): AMMONIA in the last 168 hours.  CBC: Recent Labs  Lab 02/12/21 0613  WBC 13.1*  HGB 13.8  HCT 41.7  MCV 96.5  PLT 226    Cardiac Enzymes: No results for input(s): CKTOTAL, CKMB, CKMBINDEX, TROPONINI in the last 168 hours.  BNP (last 3 results) No results for input(s): BNP in the last 8760 hours.  ProBNP (last 3 results) No results for input(s): PROBNP in the last 8760 hours.  Radiological Exams: DG CHEST PORT 1 VIEW  Result Date:  02/12/2021 CLINICAL DATA:  Pneumonia follow-up EXAM: PORTABLE CHEST 1 VIEW COMPARISON:  Eighteen days ago FINDINGS: Low volume chest with coarse interstitial opacities. There is fibrosis by October 2021 chest CT. Borderline heart size. Tracheostomy tube in place. Percutaneous gastrostomy tube in place. 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 No interval change. Electronically Signed   By: Marnee Spring M.D.   On: 02/12/2021 08:00    Assessment/Plan Active Problems:   Acute on chronic respiratory failure with hypoxia (HCC)   COVID-19 virus infection   Tracheostomy status (HCC)   Severe sepsis (HCC)   1. Acute on chronic respiratory failure with hypoxia continue with capping trials titrate oxygen as tolerated 2. COVID-19 virus infection recovery 3. Severe sepsis treated resolved 4. Tracheostomy remains in place   I have personally seen and evaluated the patient, evaluated laboratory and imaging results, formulated the assessment and plan and placed orders. The Patient requires high complexity decision making with multiple systems involvement.  Rounds were done with the Respiratory Therapy Director and Staff therapists and discussed with nursing staff also.  Yevonne Pax, MD [facility] Pulmonary Critical Care Medicine Sleep Medicine

## 2021-02-14 DIAGNOSIS — U071 COVID-19: Secondary | ICD-10-CM | POA: Diagnosis not present

## 2021-02-14 DIAGNOSIS — A419 Sepsis, unspecified organism: Secondary | ICD-10-CM | POA: Diagnosis not present

## 2021-02-14 DIAGNOSIS — Z930 Tracheostomy status: Secondary | ICD-10-CM | POA: Diagnosis not present

## 2021-02-14 DIAGNOSIS — J9621 Acute and chronic respiratory failure with hypoxia: Secondary | ICD-10-CM | POA: Diagnosis not present

## 2021-02-14 LAB — NOVEL CORONAVIRUS, NAA (HOSP ORDER, SEND-OUT TO REF LAB; TAT 18-24 HRS): SARS-CoV-2, NAA: NOT DETECTED

## 2021-02-14 NOTE — Progress Notes (Signed)
Pulmonary Critical Care Medicine [facility] [place]   PULMONARY CRITICAL CARE SERVICE  PROGRESS NOTE  Date of Service: 02/14/2021  Denilson Salminen  [id number]:[id number]  DOB: 05-20-1971   DOA: 11/23/2020  Referring Physician: Carron Curie, MD  HPI: Granger Chui is a 50 y.o. male seen for follow up of Acute on Chronic Respiratory Failure.  Patient is capping on 1 L oxygen doing well  Medications: Reviewed on Rounds  Physical Exam:  Vitals: Temperature 97.6 pulse 98 respiratory 18 blood pressure is 129/77 saturations 100%  Ventilator Settings capping off the vent  . General: Comfortable at this time . Eyes: Grossly normal lids, irises & conjunctiva . ENT: grossly tongue is normal . Neck: no obvious mass . Cardiovascular: S1 S2 normal no gallop . Respiratory: No rhonchi no rales . Abdomen: soft . Skin: no rash seen on limited exam . Musculoskeletal: not rigid . Psychiatric:unable to assess . Neurologic: no seizure no involuntary movements         Lab Data:   Basic Metabolic Panel: Recent Labs  Lab 02/12/21 0613  NA 141  K 4.7  CL 93*  CO2 32  GLUCOSE 110*  BUN 28*  CREATININE 0.76  CALCIUM 10.7*    ABG: No results for input(s): PHART, PCO2ART, PO2ART, HCO3, O2SAT in the last 168 hours.  Liver Function Tests: No results for input(s): AST, ALT, ALKPHOS, BILITOT, PROT, ALBUMIN in the last 168 hours. No results for input(s): LIPASE, AMYLASE in the last 168 hours. No results for input(s): AMMONIA in the last 168 hours.  CBC: Recent Labs  Lab 02/12/21 0613  WBC 13.1*  HGB 13.8  HCT 41.7  MCV 96.5  PLT 226    Cardiac Enzymes: No results for input(s): CKTOTAL, CKMB, CKMBINDEX, TROPONINI in the last 168 hours.  BNP (last 3 results) No results for input(s): BNP in the last 8760 hours.  ProBNP (last 3 results) No results for input(s): PROBNP in the last 8760 hours.  Radiological Exams: No results found.  Assessment/Plan Active  Problems:   Acute on chronic respiratory failure with hypoxia (HCC)   COVID-19 virus infection   Tracheostomy status (HCC)   Severe sepsis (HCC)   1. Acute on chronic respiratory failure with hypoxia we will continue with capping trials 2. COVID-19 virus infection in recovery 3. Severe sepsis treated in resolution 4. Tracheostomy remains in place   I have personally seen and evaluated the patient, evaluated laboratory and imaging results, formulated the assessment and plan and placed orders. The Patient requires high complexity decision making with multiple systems involvement.  Rounds were done with the Respiratory Therapy Director and Staff therapists and discussed with nursing staff also.  Yevonne Pax, MD [facility] Pulmonary Critical Care Medicine Sleep Medicine

## 2022-08-11 IMAGING — CT CT ANGIO CHEST
3 of 7 series · 18 of 36 positions shown · IV contrast (omnipaque)
Comparison: None.

CLINICAL DATA: Recovering from [id number]

EXAM:
CT ANGIOGRAPHY CHEST WITH CONTRAST
TECHNIQUE: Multidetector CT imaging of the chest was performed using the
standard protocol during bolus administration of intravenous
contrast. Multiplanar CT image reconstructions and MIPs were
obtained to evaluate the vascular anatomy.
CONTRAST:  60mL OMNIPAQUE IOHEXOL 350 MG/ML SOLN

[Series 6: pe lung · axial · 0.73mm/px · z∈[+1182,+1234]mm · 2 of 105 slices shown]
[im 27/105  mediastinal]
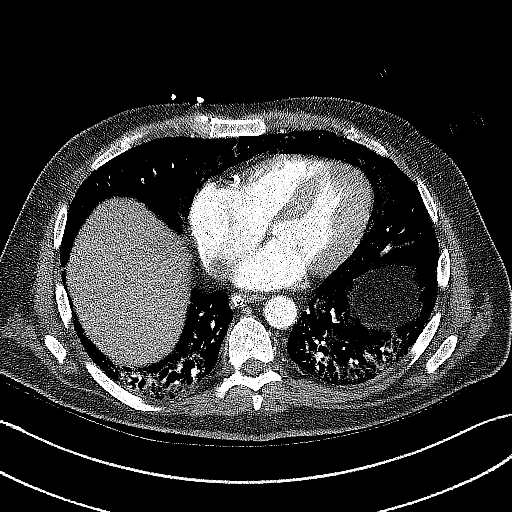
[im 53/105  mediastinal]
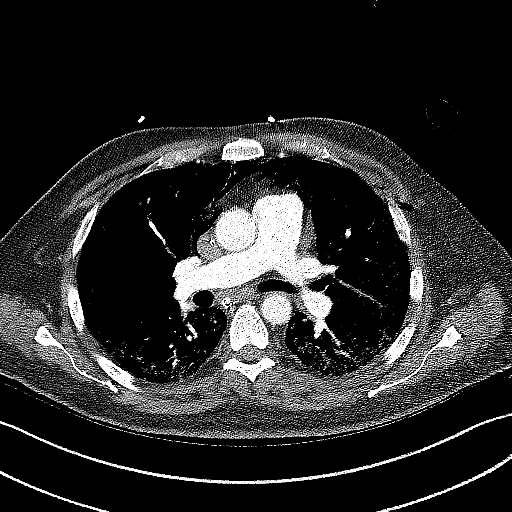

[Series 7: pe thins · axial · 0.73mm/px · z∈[+1120,+1324]mm · 15 of 332 slices shown]
[im 21/332  lung]
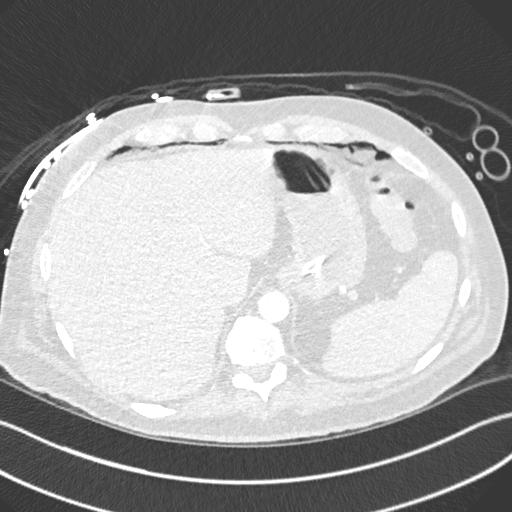
[im 42/332  mediastinal]
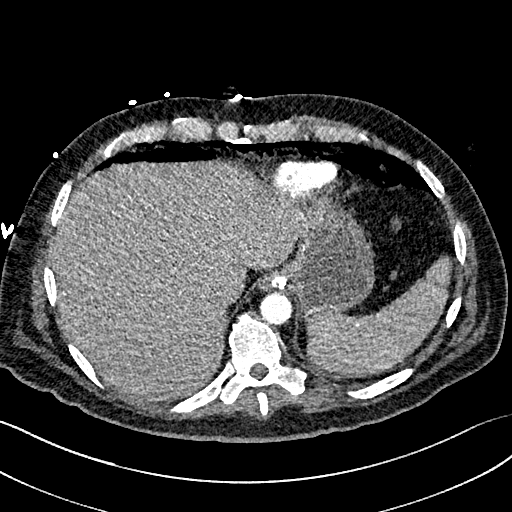
[im 63/332  lung]
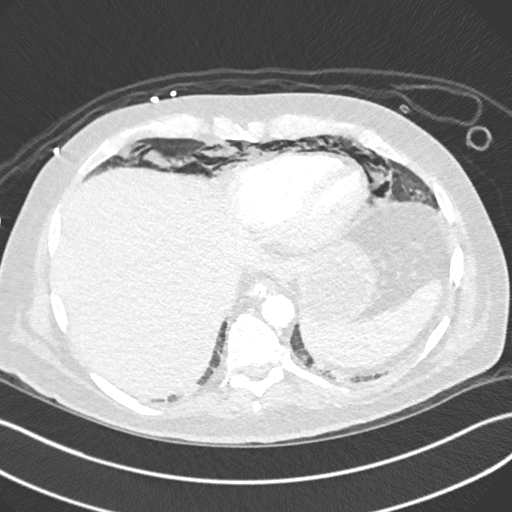
[im 83/332  mediastinal]
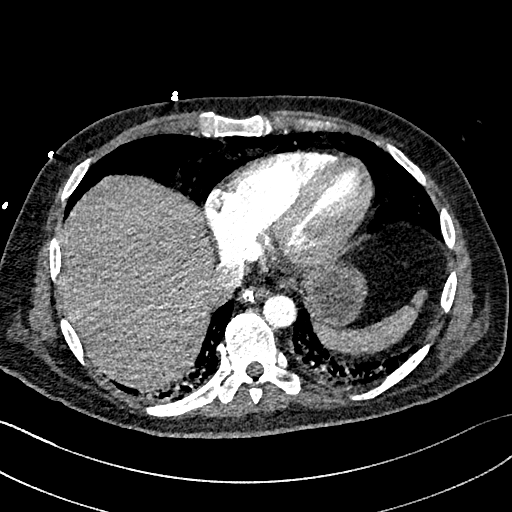
[im 104/332  lung]
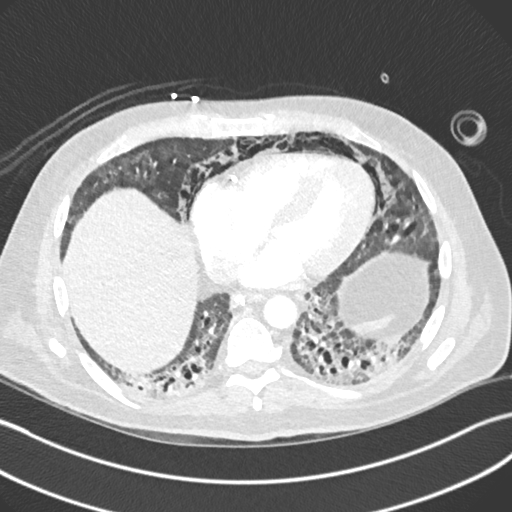
[im 125/332  mediastinal]
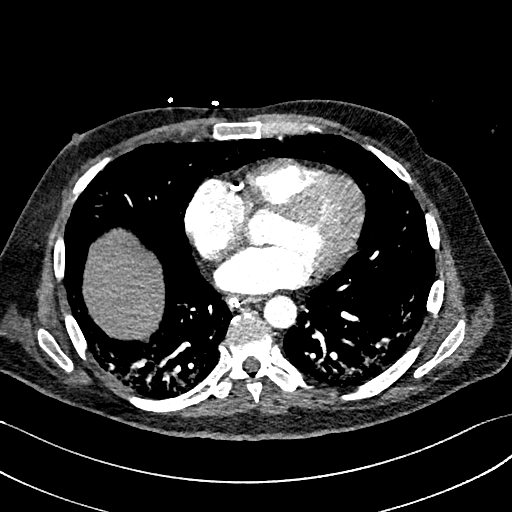
[im 145/332  lung]
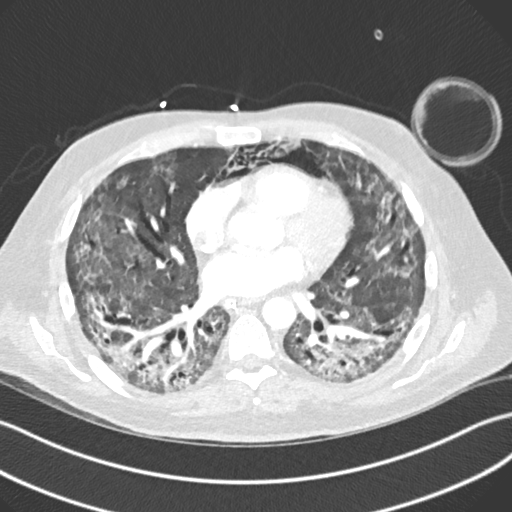
[im 166/332  mediastinal]
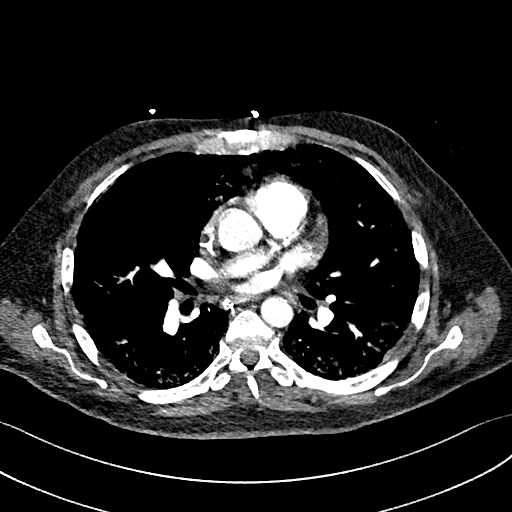
[im 187/332  lung]
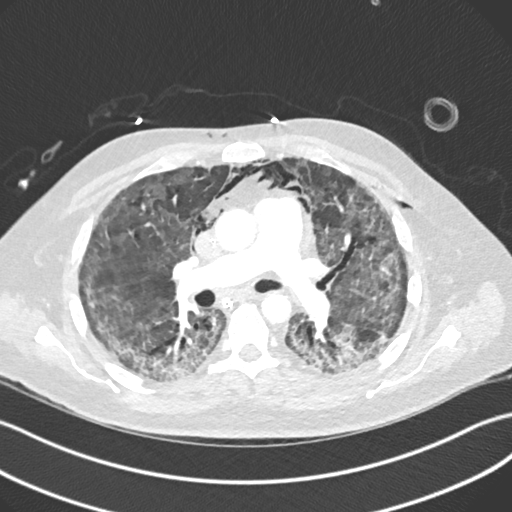
[im 207/332  mediastinal]
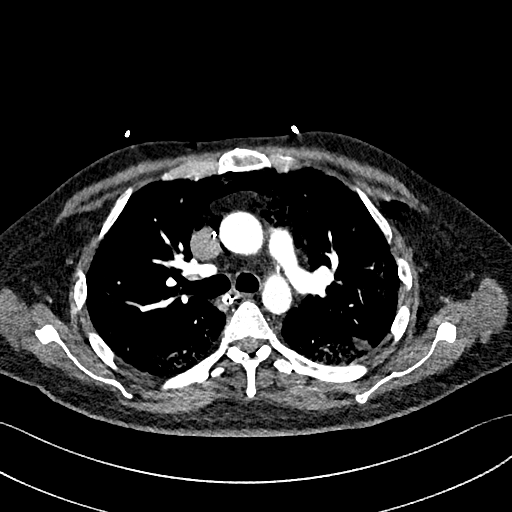
[im 228/332  lung]
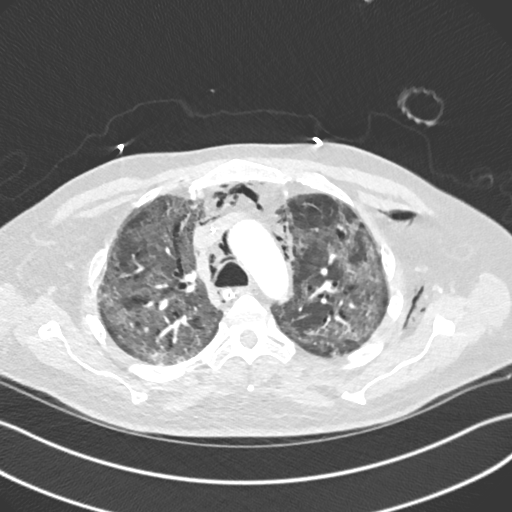
[im 249/332  mediastinal]
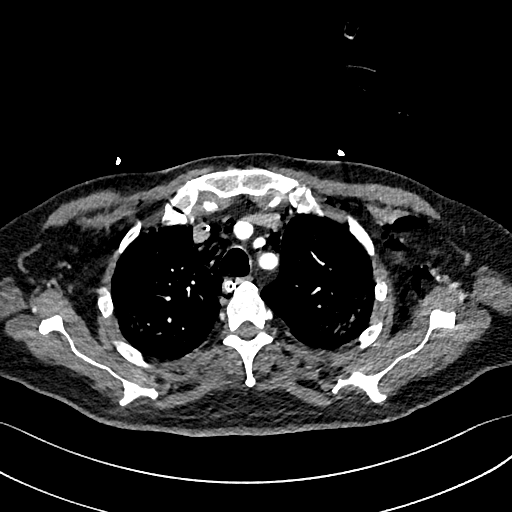
[im 269/332  lung]
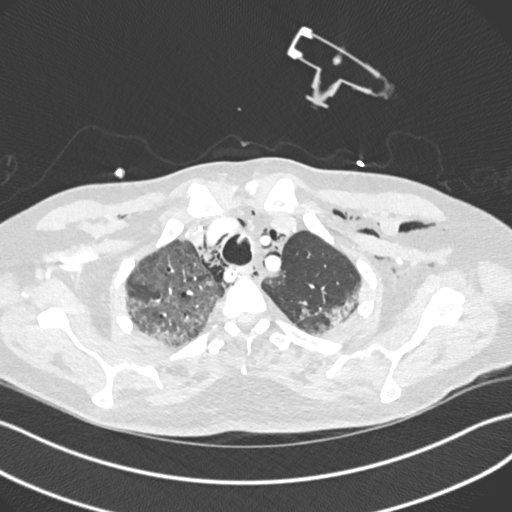
[im 290/332  mediastinal]
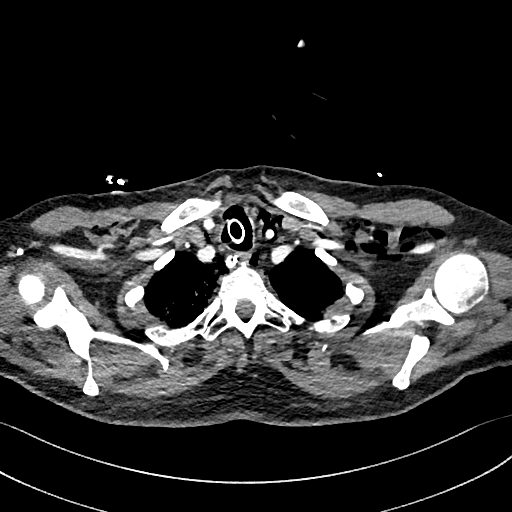
[im 311/332  lung]
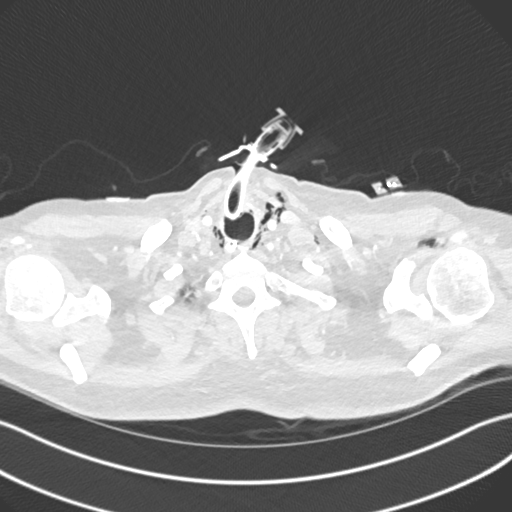

[Series 8: pe 2mm cor · coronal · 0.50mm/px · 1 of 151 slices shown]
[im 76/151  mediastinal]
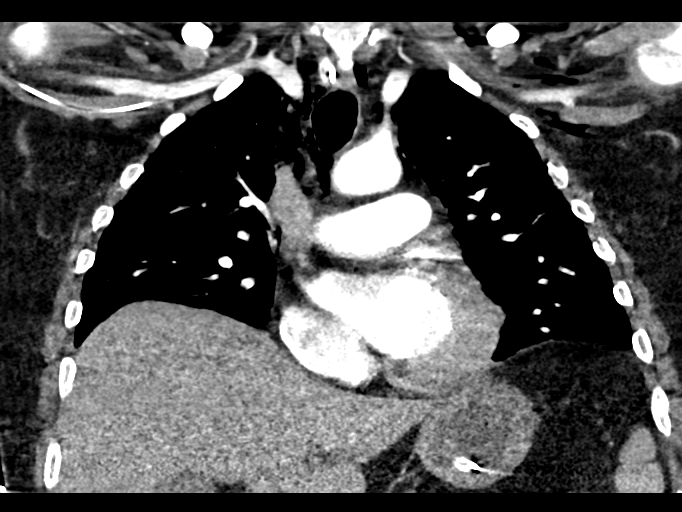

[18 of 36 positions shown; findings below may reference images not displayed]

FINDINGS: Cardiovascular: Right arm PICC line extends to the SVC. Heart size
normal. No pericardial effusion. The RV is nondilated. Satisfactory
opacification of pulmonary arteries noted, and there is no evidence
of pulmonary emboli. Coronary calcifications. Adequate contrast
opacification of the thoracic aorta with no evidence of dissection,
aneurysm, or stenosis. There is classic 3-vessel brachiocephalic
arch anatomy without proximal stenosis.

Mediastinum/Nodes: Tracheostomy device projects in expected
location. Gastric tube extends to the stomach, tip not seen.
Pneumomediastinum. No mass or adenopathy.

Lungs/Pleura: No pleural effusion. No pneumothorax. Bronchiectasis
posteriorly in both lower lobes scattered ground-glass opacities
throughout both lungs with subpleural consolidation/atelectasis
peripherally in a predominantly dependent distribution.

Upper Abdomen: Gastric tube to the proximal duodenum. Atheromatous
aorta. 2 mm left renal calculus without hydronephrosis. No acute
findings.

Musculoskeletal: No chest wall abnormality. No acute or significant
osseous findings.

Review of the MIP images confirms the above findings.
IMPRESSION: 1. Negative for acute PE or thoracic aortic dissection.
2. Pneumomediastinum.
3. Bilateral lower lobe bronchiectasis and scattered ground-glass
opacities with subpleural consolidation/atelectasis peripherally in
both lungs, consistent with post COVID change.
4. Coronary and Aortic Atherosclerosis ([id number]-1IO.O).
